# Patient Record
Sex: Male | Born: 1940 | Race: Black or African American | Hispanic: No | Marital: Married | Smoking: Never smoker
Health system: Southern US, Community
[De-identification: ages and names within clinical notes are randomized; demographics above are authoritative.]

## PROBLEM LIST (undated history)

## (undated) DIAGNOSIS — L819 Disorder of pigmentation, unspecified: Secondary | ICD-10-CM

## (undated) DIAGNOSIS — N184 Chronic kidney disease, stage 4 (severe): Secondary | ICD-10-CM

## (undated) DIAGNOSIS — I739 Peripheral vascular disease, unspecified: Secondary | ICD-10-CM

## (undated) DIAGNOSIS — I779 Disorder of arteries and arterioles, unspecified: Secondary | ICD-10-CM

## (undated) DIAGNOSIS — N183 Chronic kidney disease, stage 3 unspecified: Secondary | ICD-10-CM

## (undated) DIAGNOSIS — K219 Gastro-esophageal reflux disease without esophagitis: Secondary | ICD-10-CM

## (undated) DIAGNOSIS — E7800 Pure hypercholesterolemia, unspecified: Secondary | ICD-10-CM

## (undated) DIAGNOSIS — R200 Anesthesia of skin: Secondary | ICD-10-CM

## (undated) DIAGNOSIS — E1151 Type 2 diabetes mellitus with diabetic peripheral angiopathy without gangrene: Secondary | ICD-10-CM

## (undated) DIAGNOSIS — E669 Obesity, unspecified: Secondary | ICD-10-CM

## (undated) DIAGNOSIS — I129 Hypertensive chronic kidney disease with stage 1 through stage 4 chronic kidney disease, or unspecified chronic kidney disease: Secondary | ICD-10-CM

## (undated) DIAGNOSIS — E785 Hyperlipidemia, unspecified: Secondary | ICD-10-CM

## (undated) DIAGNOSIS — E119 Type 2 diabetes mellitus without complications: Secondary | ICD-10-CM

## (undated) HISTORY — PX: TONSILLECTOMY: SUR1361

## (undated) HISTORY — DX: Type 2 diabetes mellitus with diabetic peripheral angiopathy without gangrene: E11.51

## (undated) HISTORY — DX: Type 2 diabetes mellitus without complications: E11.9

## (undated) HISTORY — DX: Anesthesia of skin: R20.0

## (undated) HISTORY — DX: Chronic kidney disease, stage 3 unspecified: N18.30

## (undated) HISTORY — DX: Chronic kidney disease, stage 3 (moderate): N18.3

## (undated) HISTORY — DX: Hyperlipidemia, unspecified: E78.5

## (undated) HISTORY — DX: Obesity, unspecified: E66.9

## (undated) HISTORY — DX: Gastro-esophageal reflux disease without esophagitis: K21.9

## (undated) HISTORY — DX: Disorder of pigmentation, unspecified: L81.9

## (undated) HISTORY — DX: Disorder of arteries and arterioles, unspecified: I77.9

## (undated) HISTORY — DX: Hypertensive chronic kidney disease with stage 1 through stage 4 chronic kidney disease, or unspecified chronic kidney disease: I12.9

## (undated) HISTORY — DX: Pure hypercholesterolemia, unspecified: E78.00

## (undated) HISTORY — PX: WISDOM TOOTH EXTRACTION: SHX21

## (undated) HISTORY — DX: Peripheral vascular disease, unspecified: I73.9

---

## 2003-08-28 ENCOUNTER — Encounter: Admission: RE | Admit: 2003-08-28 | Discharge: 2003-08-28 | Payer: Self-pay | Admitting: Family Medicine

## 2012-07-04 ENCOUNTER — Other Ambulatory Visit: Payer: Self-pay | Admitting: Gastroenterology

## 2012-08-22 ENCOUNTER — Encounter (HOSPITAL_COMMUNITY): Payer: Self-pay | Admitting: Pharmacy Technician

## 2012-09-13 ENCOUNTER — Encounter (HOSPITAL_COMMUNITY): Admission: RE | Payer: Self-pay | Source: Ambulatory Visit

## 2012-09-13 SURGERY — COLONOSCOPY WITH PROPOFOL
Anesthesia: Monitor Anesthesia Care

## 2012-09-14 ENCOUNTER — Ambulatory Visit (HOSPITAL_COMMUNITY): Admission: RE | Admit: 2012-09-14 | Payer: 59 | Source: Ambulatory Visit | Admitting: Gastroenterology

## 2012-11-16 ENCOUNTER — Encounter: Payer: Self-pay | Admitting: Physician Assistant

## 2013-01-18 ENCOUNTER — Encounter: Payer: Self-pay | Admitting: Podiatry

## 2013-01-18 ENCOUNTER — Ambulatory Visit (INDEPENDENT_AMBULATORY_CARE_PROVIDER_SITE_OTHER): Payer: Medicare HMO | Admitting: Podiatry

## 2013-01-18 VITALS — BP 140/80 | HR 74 | Resp 20 | Ht 66.0 in | Wt 250.0 lb

## 2013-01-18 DIAGNOSIS — B351 Tinea unguium: Secondary | ICD-10-CM

## 2013-01-18 DIAGNOSIS — M79609 Pain in unspecified limb: Secondary | ICD-10-CM

## 2013-01-18 NOTE — Progress Notes (Signed)
Patient ID: Daniel Warner, male   DOB: 06-12-1940, 73 y.o.   MRN: [id number]  Subjective: This 73 year old black diabetic male presents for ongoing debridement of painful mycotic toenails an approximately three-month intervals. His last visit for this service was 10/05/2012. His been a patient in this practice since 2013.  Objective: Hypertrophic, incurvated, discolored toenails with palpable tenderness in all nail plates  Assessment: Symptomatic onychomycoses x10  Plan: Debridement of 10 toenails without a bleeding and reappoint at three-month intervals.

## 2013-02-03 ENCOUNTER — Ambulatory Visit: Payer: 59 | Admitting: Interventional Cardiology

## 2013-02-24 ENCOUNTER — Ambulatory Visit: Payer: Commercial Managed Care - HMO | Admitting: Interventional Cardiology

## 2013-04-11 ENCOUNTER — Ambulatory Visit (INDEPENDENT_AMBULATORY_CARE_PROVIDER_SITE_OTHER): Payer: Commercial Managed Care - HMO | Admitting: Interventional Cardiology

## 2013-04-11 ENCOUNTER — Encounter: Payer: Self-pay | Admitting: Interventional Cardiology

## 2013-04-11 VITALS — BP 140/72 | HR 79 | Ht 66.0 in | Wt 260.0 lb

## 2013-04-11 DIAGNOSIS — E669 Obesity, unspecified: Secondary | ICD-10-CM | POA: Insufficient documentation

## 2013-04-11 DIAGNOSIS — I10 Essential (primary) hypertension: Secondary | ICD-10-CM | POA: Insufficient documentation

## 2013-04-11 DIAGNOSIS — E782 Mixed hyperlipidemia: Secondary | ICD-10-CM | POA: Insufficient documentation

## 2013-04-11 DIAGNOSIS — R9431 Abnormal electrocardiogram [ECG] [EKG]: Secondary | ICD-10-CM

## 2013-04-11 DIAGNOSIS — I6529 Occlusion and stenosis of unspecified carotid artery: Secondary | ICD-10-CM | POA: Insufficient documentation

## 2013-04-11 NOTE — Progress Notes (Signed)
Patient ID: Daniel Warner, male   DOB: [birth year], 73 y.o.   MRN: [id number]    Daniel Warner, Daniel Warner, Daniel Warner Phone: [phone] Fax:  [phone]  Date:  04/11/2013   ID:  Daniel Warner, DOB 12-29-1940, MRN [id number]  PCP:  Daniel Argyle, Daniel Warner      History of Present Illness: Daniel Warner is a 73 y.o. male who has noticed some discoloration of the side of the right leg, for about a year. He thinks it was related to carrying a bag that constantly rubbed against his leg. No prolonged pain in that leg. No calf pain. No pain associated with walking. He felt that his legs get tired sometimes last year, but this has resolved. He is not having to stop walking due to leg pain. No ulcers on his feet. Decreased numbness in his toes in the mornings, sometimes it is absent. He did a treadmill test 2 years ago at the [place] and he did well on that.  No CP.  Mild DOE with walking.   Wt Readings from Last 3 Encounters:  04/11/13 260 lb (117.935 kg)  01/18/13 250 lb (113.399 kg)     Past Medical History  Diagnosis Date  . Diabetes mellitus without complication   . GERD (gastroesophageal reflux disease)   . Hyperlipidemia   . Hypercholesterolemia   . Peripheral arterial disease   . Diabetes mellitus type 2 with peripheral artery disease   . Benign hypertension with chronic kidney disease, stage III   . CKD (chronic kidney disease), stage III   . Numbness of toes     In the morning  . Carotid artery disease   . Obesity   . Discolored skin     Right leg, thinks it's from a bag that has rubbed against his leg for so long. Doppler 12/2010    Current Outpatient Prescriptions  Medication Sig Dispense Refill  . amLODipine (NORVASC) 10 MG tablet Take 10 mg by mouth every morning.      Marland Kitchen atorvastatin (LIPITOR) 10 MG tablet Take 10 mg by mouth every morning.      Marland Kitchen glimepiride (AMARYL) 4 MG tablet Take 4 mg by mouth daily with breakfast.      . metFORMIN  (GLUCOPHAGE) 850 MG tablet Take 850 mg by mouth every 8 (eight) hours.       Marland Kitchen omeprazole (PRILOSEC) 20 MG capsule Take 20 mg by mouth daily.      . valsartan (DIOVAN) 80 MG tablet Take 80 mg by mouth daily.       No current facility-administered medications for this visit.    Allergies:   No Known Allergies  Social History:  The patient  reports that he has never smoked. He has never used smokeless tobacco. He reports that he does not drink alcohol or use illicit drugs.   Family History:  The patient's family history includes Heart disease in his cousin and paternal aunt; Irritable bowel syndrome in his mother; Stroke in his father.   ROS:  Please see the history of present illness.  No nausea, vomiting.  No fevers, chills.  No focal weakness.  No dysuria.    All other systems reviewed and negative. Mild leg swelling.  PHYSICAL EXAM: VS:  BP 140/72  Pulse 79  Ht $R'5\' 6"'EA$  (1.676 m)  Wt 260 lb (117.935 kg)  BMI 41.99 kg/m2 Well nourished, well developed, in no acute distress HEENT: normal Neck: no JVD, no  carotid bruits Cardiac:  normal S1, S2; RRR;  Lungs:  clear to auscultation bilaterally, no wheezing, rhonchi or rales Abd: soft, nontender, no hepatomegaly Ext: no edema Skin: warm and dry Neuro:   no focal abnormalities noted  EKG:  NSR, inferior Q waves    ASSESSMENT AND PLAN:  Peripheral artery disease  Continue Atorvastatin Calcium Tablet, 10 MG, 1 tablet, Orally, Once a day, 30 day(s), 30, Refills 11 Does not appear to have limb threatening ischemia. No ulcers. No rest pain. Watch for claudication. Continue regular walking to try to improve circulation. LDL 94. Increase exercise to increase HDL (27).    2. Hypertension  Stopped Chlorthalidone Tablet, 25 MG, 1 tablet every morning, Orally, Once a day Continue Amlodipine Besylate Tablet, 10 MG, 1 tablet, Orally, Once a day Continue Valsartan Tablet, 80 MG, 1 tablet, Orally, Once a day Check BP occasionally at home or at  drugstore.   3. Obesity  Needs to work on weight loss. He had lost about 13 lbs but gained some back in the past few months.  Did modify diet to get HbA1c down from 8 to 6.1.   4. Carotid Artery Disease  Moderate by Doppler in 2013.     Abnormal ECG: Q waves in leads 3 and AVF.  Change in lead 3; AVF similar to prior. No cardiac sx.  WOuld hold off on evaluation at this time.     5.  Hyperlipidemia:  LDL 101, HDL 26, TG 85  In 4/14: COntinue atorvastatin. Preventive Medicine  Adult topics discussed:  Diet: healthy diet, low calorie, low fat.  Exercise: at least 30 minutes of aerobic exercise, 5 days a week.      Signed, Mina Marble, Daniel Warner, [facility] 04/11/2013 3:26 PM

## 2013-04-11 NOTE — Patient Instructions (Signed)
Your physician recommends that you continue on your current medications as directed. Please refer to the Current Medication list given to you today.  Your physician wants you to follow-up in: 1 year with Dr. Varanasi. You will receive a reminder letter in the mail two months in advance. If you don't receive a letter, please call our office to schedule the follow-up appointment.  

## 2013-04-12 ENCOUNTER — Encounter: Payer: Self-pay | Admitting: Podiatry

## 2013-04-12 ENCOUNTER — Ambulatory Visit (INDEPENDENT_AMBULATORY_CARE_PROVIDER_SITE_OTHER): Payer: Medicare HMO | Admitting: Podiatry

## 2013-04-12 VITALS — BP 201/100 | HR 98 | Resp 18 | Ht 64.0 in | Wt 260.0 lb

## 2013-04-12 DIAGNOSIS — M79609 Pain in unspecified limb: Secondary | ICD-10-CM

## 2013-04-12 DIAGNOSIS — B351 Tinea unguium: Secondary | ICD-10-CM

## 2013-04-12 NOTE — Progress Notes (Signed)
Patient ID: Daniel Warner, male   DOB: 02-10-1940, 73 y.o.   MRN: [id number] Subjective: This 73 year old black diabetic male presents for ongoing debridement of painful mycotic toenails an approximately three-month intervals. His last visit for this service was 01/18/2013. His been a patient in this practice since 2013.   Objective: Hypertrophic, incurvated, discolored toenails with palpable tenderness in all nail plates   Assessment: Symptomatic onychomycoses x10   Plan: Debridement of 10 toenails without a bleeding and reappoint at three-month intervals.

## 2013-04-12 NOTE — Patient Instructions (Signed)
Use all-purpose non-fragrance skin lotion on your feet daily

## 2013-04-15 ENCOUNTER — Encounter: Payer: Self-pay | Admitting: *Deleted

## 2013-06-23 ENCOUNTER — Other Ambulatory Visit: Payer: Self-pay | Admitting: Interventional Cardiology

## 2013-11-15 ENCOUNTER — Encounter: Payer: Self-pay | Admitting: Podiatry

## 2013-11-15 ENCOUNTER — Ambulatory Visit (INDEPENDENT_AMBULATORY_CARE_PROVIDER_SITE_OTHER): Payer: Commercial Managed Care - HMO | Admitting: Podiatry

## 2013-11-15 VITALS — BP 135/69 | HR 82 | Resp 12

## 2013-11-15 DIAGNOSIS — B351 Tinea unguium: Secondary | ICD-10-CM

## 2013-11-15 DIAGNOSIS — M79676 Pain in unspecified toe(s): Secondary | ICD-10-CM

## 2013-11-15 NOTE — Progress Notes (Signed)
Patient ID: Daniel Warner, male   DOB: 22-Jun-1940, 73 y.o.   MRN: [id number]  Subjective: This diabetic male presents for ongoing debridement of painful mycotic toenails  Objective: Elongated, hypertrophic, incurvated, discolored toenails 6-10  Assessment: Symptomatic onychomycoses 6-10  Plan: Debrided toenails 10 without a bleeding  Reappoint 3 months

## 2014-02-21 ENCOUNTER — Ambulatory Visit: Payer: Commercial Managed Care - HMO | Admitting: Podiatry

## 2014-10-15 ENCOUNTER — Encounter (HOSPITAL_COMMUNITY): Payer: Self-pay | Admitting: *Deleted

## 2014-10-15 ENCOUNTER — Emergency Department (HOSPITAL_COMMUNITY)
Admission: EM | Admit: 2014-10-15 | Discharge: 2014-10-15 | Disposition: A | Discharge status: Home / Self Care | Payer: Commercial Managed Care - HMO | Attending: Emergency Medicine | Admitting: Emergency Medicine

## 2014-10-15 DIAGNOSIS — Z872 Personal history of diseases of the skin and subcutaneous tissue: Secondary | ICD-10-CM | POA: Insufficient documentation

## 2014-10-15 DIAGNOSIS — E669 Obesity, unspecified: Secondary | ICD-10-CM | POA: Insufficient documentation

## 2014-10-15 DIAGNOSIS — N189 Chronic kidney disease, unspecified: Secondary | ICD-10-CM | POA: Diagnosis not present

## 2014-10-15 DIAGNOSIS — Z79899 Other long term (current) drug therapy: Secondary | ICD-10-CM | POA: Diagnosis not present

## 2014-10-15 DIAGNOSIS — M79674 Pain in right toe(s): Secondary | ICD-10-CM | POA: Diagnosis not present

## 2014-10-15 DIAGNOSIS — K219 Gastro-esophageal reflux disease without esophagitis: Secondary | ICD-10-CM | POA: Diagnosis not present

## 2014-10-15 DIAGNOSIS — E1151 Type 2 diabetes mellitus with diabetic peripheral angiopathy without gangrene: Secondary | ICD-10-CM | POA: Insufficient documentation

## 2014-10-15 DIAGNOSIS — M79671 Pain in right foot: Secondary | ICD-10-CM | POA: Diagnosis present

## 2014-10-15 DIAGNOSIS — I739 Peripheral vascular disease, unspecified: Secondary | ICD-10-CM | POA: Insufficient documentation

## 2014-10-15 DIAGNOSIS — I70221 Atherosclerosis of native arteries of extremities with rest pain, right leg: Secondary | ICD-10-CM | POA: Diagnosis not present

## 2014-10-15 DIAGNOSIS — I129 Hypertensive chronic kidney disease with stage 1 through stage 4 chronic kidney disease, or unspecified chronic kidney disease: Secondary | ICD-10-CM | POA: Diagnosis not present

## 2014-10-15 LAB — CBC WITH DIFFERENTIAL/PLATELET
Basophils Absolute: 0 10*3/uL (ref 0.0–0.1)
Basophils Relative: 0 %
Eosinophils Absolute: 0.2 10*3/uL (ref 0.0–0.7)
Eosinophils Relative: 2 %
HEMATOCRIT: 38.6 % — AB (ref 39.0–52.0)
HEMOGLOBIN: 13 g/dL (ref 13.0–17.0)
LYMPHS ABS: 2.2 10*3/uL (ref 0.7–4.0)
Lymphocytes Relative: 26 %
MCH: 26.6 pg (ref 26.0–34.0)
MCHC: 33.7 g/dL (ref 30.0–36.0)
MCV: 79.1 fL (ref 78.0–100.0)
MONO ABS: 0.5 10*3/uL (ref 0.1–1.0)
MONOS PCT: 6 %
NEUTROS ABS: 5.4 10*3/uL (ref 1.7–7.7)
NEUTROS PCT: 66 %
Platelets: 218 10*3/uL (ref 150–400)
RBC: 4.88 MIL/uL (ref 4.22–5.81)
RDW: 16.2 % — AB (ref 11.5–15.5)
WBC: 8.2 10*3/uL (ref 4.0–10.5)

## 2014-10-15 LAB — BASIC METABOLIC PANEL WITH GFR
Anion gap: 12 (ref 5–15)
BUN: 15 mg/dL (ref 6–20)
CO2: 21 mmol/L — ABNORMAL LOW (ref 22–32)
Calcium: 9 mg/dL (ref 8.9–10.3)
Chloride: 100 mmol/L — ABNORMAL LOW (ref 101–111)
Creatinine, Ser: 1.46 mg/dL — ABNORMAL HIGH (ref 0.61–1.24)
GFR calc Af Amer: 53 mL/min — ABNORMAL LOW
GFR calc non Af Amer: 46 mL/min — ABNORMAL LOW
Glucose, Bld: 131 mg/dL — ABNORMAL HIGH (ref 65–99)
Potassium: 4.7 mmol/L (ref 3.5–5.1)
Sodium: 133 mmol/L — ABNORMAL LOW (ref 135–145)

## 2014-10-15 LAB — PROTIME-INR
INR: 1.11 (ref 0.00–1.49)
Prothrombin Time: 14.5 s (ref 11.6–15.2)

## 2014-10-15 MED ORDER — OXYCODONE-ACETAMINOPHEN 5-325 MG PO TABS: 1.0000 | ORAL_TABLET | Freq: Four times a day (QID) | ORAL | Status: DC | PRN

## 2014-10-15 MED ORDER — OXYCODONE-ACETAMINOPHEN 5-325 MG PO TABS
2.0000 | ORAL_TABLET | Freq: Once | ORAL | Status: AC
Start: 2014-10-15 — End: 2014-10-15
  Administered 2014-10-15: 2 via ORAL
  Filled 2014-10-15: qty 2

## 2014-10-15 MED ORDER — MORPHINE SULFATE (PF) 4 MG/ML IV SOLN
4.0000 mg | Freq: Once | INTRAVENOUS | Status: AC
  Administered 2014-10-15: 4 mg via INTRAVENOUS
  Filled 2014-10-15: qty 1

## 2014-10-15 MED ORDER — SODIUM CHLORIDE 0.9 % IV BOLUS (SEPSIS)
500.0000 mL | Freq: Once | INTRAVENOUS | Status: AC
  Administered 2014-10-15: 500 mL via INTRAVENOUS

## 2014-10-15 NOTE — Discharge Instructions (Signed)
Peripheral Vascular Disease  Peripheral Vascular Disease (PVD), also called Peripheral Arterial Disease (PAD), is a circulation problem caused by cholesterol (atherosclerotic plaque) deposits in the arteries. PVD commonly occurs in the lower extremities (legs) but it can occur in other areas of the body, such as your arms. The cholesterol buildup in the arteries reduces blood flow which can cause pain and other serious problems. The presence of PVD can place a person at risk for Coronary Artery Disease (CAD).  CAUSES  Causes of PVD can be many. It is usually associated with more than one risk factor such as:   High Cholesterol.  Smoking.  Diabetes.  Lack of exercise or inactivity.  High blood pressure (hypertension).  Obesity.  Family history. SYMPTOMS   When the lower extremities are affected, patients with PVD may experience:  Leg pain with exertion or physical activity. This is called INTERMITTENT CLAUDICATION. This may present as cramping or numbness with physical activity. The location of the pain is associated with the level of blockage. For example, blockage at the abdominal level (distal abdominal aorta) may result in buttock or hip pain. Lower leg arterial blockage may result in calf pain.  As PVD becomes more severe, pain can develop with less physical activity.  In people with severe PVD, leg pain may occur at rest.  Other PVD signs and symptoms:  Leg numbness or weakness.  Coldness in the affected leg or foot, especially when compared to the other leg.  A change in leg color.  Patients with significant PVD are more prone to ulcers or sores on toes, feet or legs. These may take longer to heal or may reoccur. The ulcers or sores can become infected.  If signs and symptoms of PVD are ignored, gangrene may occur. This can result in the loss of toes or loss of an entire limb.  Not all leg pain is related to PVD. Other medical conditions can cause leg pain such  as:  Blood clots (embolism) or Deep Vein Thrombosis.  Inflammation of the blood vessels (vasculitis).  Spinal stenosis. DIAGNOSIS  Diagnosis of PVD can involve several different types of tests. These can include:  Pulse Volume Recording Method (PVR). This test is simple, painless and does not involve the use of X-rays. PVR involves measuring and comparing the blood pressure in the arms and legs. An ABI (Ankle-Brachial Index) is calculated. The normal ratio of blood pressures is 1. As this number becomes smaller, it indicates more severe disease.  < 0.95 - indicates significant narrowing in one or more leg vessels.  <0.8 - there will usually be pain in the foot, leg or buttock with exercise.  <0.4 - will usually have pain in the legs at rest.  <0.25 - usually indicates limb threatening PVD.  Doppler detection of pulses in the legs. This test is painless and checks to see if you have a pulses in your legs/feet.  A dye or contrast material (a substance that highlights the blood vessels so they show up on x-ray) may be given to help your caregiver better see the arteries for the following tests. The dye is eliminated from your body by the kidney's. Your caregiver may order blood work to check your kidney function and other laboratory values before the following tests are performed:  Magnetic Resonance Angiography (MRA). An MRA is a picture study of the blood vessels and arteries. The MRA machine uses a large magnet to produce images of the blood vessels.  Computed Tomography Angiography (CTA). A  CTA is a specialized x-ray that looks at how the blood flows in your blood vessels. An IV may be inserted into your arm so contrast dye can be injected.  Angiogram. Is a procedure that uses x-rays to look at your blood vessels. This procedure is minimally invasive, meaning a small incision (cut) is made in your groin. A small tube (catheter) is then inserted into the artery of your groin. The catheter  is guided to the blood vessel or artery your caregiver wants to examine. Contrast dye is injected into the catheter. X-rays are then taken of the blood vessel or artery. After the images are obtained, the catheter is taken out. TREATMENT  Treatment of PVD involves many interventions which may include:  Lifestyle changes:  Quitting smoking.  Exercise.  Following a low fat, low cholesterol diet.  Control of diabetes.  Foot care is very important to the PVD patient. Good foot care can help prevent infection.  Medication:  Cholesterol-lowering medicine.  Blood pressure medicine.  Anti-platelet drugs.  Certain medicines may reduce symptoms of Intermittent Claudication.  Interventional/Surgical options:  Angioplasty. An Angioplasty is a procedure that inflates a balloon in the blocked artery. This opens the blocked artery to improve blood flow.  Stent Implant. A wire mesh tube (stent) is placed in the artery. The stent expands and stays in place, allowing the artery to remain open.  Peripheral Bypass Surgery. This is a surgical procedure that reroutes the blood around a blocked artery to help improve blood flow. This type of procedure may be performed if Angioplasty or stent implants are not an option. SEEK IMMEDIATE MEDICAL CARE IF:   You develop pain or numbness in your arms or legs.  Your arm or leg turns cold, becomes blue in color.  You develop redness, warmth, swelling and pain in your arms or legs. MAKE SURE YOU:   Understand these instructions.  Will watch your condition.  Will get help right away if you are not doing well or get worse. Document Released: 02/06/2004 Document Revised: 03/23/2011 Document Reviewed: 01/03/2008 [facility] Patient Information 2015 [place], [place]. This information is not intended to replace advice given to you by your health care provider. Make sure you discuss any questions you have with your health care provider.  Intermittent  Claudication Blockage of leg arteries results from poor circulation of blood in the leg arteries. This produces an aching, tired, and sometimes burning pain in the legs that is brought on by exercise and made better by rest. Claudication refers to the limping that happens from leg cramps. It is also referred to as Vaso-occlusive disease of the legs, arterial insufficiency of the legs, recurrent leg pain, recurrent leg cramping and calf pain with exercise.  CAUSES  This condition is due to narrowing or blockage of the arteries (muscular vessels which carry blood away from the heart and around the body). Blockage of arteries can occur anywhere in the body. If they occur in the heart, a person may experience angina (chest pain) or even a heart attack. If they occur in the neck or the brain, a person may have a stroke. Intermittent claudication is when the blockage occurs in the legs, most commonly in the calf or the foot.  Atherosclerosis, or blockage of arteries, can occur for many reasons. Some of these are smoking, diabetes, and high cholesterol. SYMPTOMS  Intermittent claudication may occur in both legs, and it often continues to get worse over time. However, some people complain only of weakness in the  legs when walking, or a feeling of "tiredness" in the buttocks. Impotence (not able to have an erection) is an occasional complaint in men. Pain while resting is uncommon.  WHAT TO EXPECT AT [facility] PROVIDER'S OFFICE: Your medical history will be asked for and a physical examination will be performed. Medical history questions documenting claudication in detail may include:   Time pattern  Do you have leg cramps at night (nocturnal cramps)?  How often does leg pain with cramping occur?  Is it getting worse?  What is the quality of the pain?  Is the pain sharp?  Is there an aching pain with the cramps?  Aggravating factors  Is it worse after you exercise?  Is it worse after you  are standing for a while?  Do you smoke? How much?  Do you drink alcohol? How much?  Are you diabetic? How well is your blood sugar controlled?  Other  What other symptoms are also present?  Has there been impotence (men)?  Is there pain in the back?  Is there a darkening of the skin of the legs, feet or toes?  Is there weakness or paralysis of the legs? The physical examination may include evaluation of the femoral pulse (in the groin) and the other areas where the pulse can be felt in the legs. DIAGNOSIS  Diagnostic tests that may be performed include:  Blood pressure measured in arms and legs for comparison.  Doppler ultrasonography on the legs and the heart.  Duplex Doppler/ultrasound exam of extremity to visualize arterial blood flow.  ECG- to evaluate the activity of your heart.  Aortography- to visualize blockages in your arteries. TREATMENT Surgical treatment may be suggested if claudication interferes with the patient's activities or work, and if the diseased arteries do not seem to be improving after treatment. Be aware that this condition can worsen over time and you should carefully monitor your condition. HOME CARE INSTRUCTIONS  Talk to your caregiver about the cause of your leg cramping and about what to do at home to relieve it.  A healthy diet is important to lessen the likeliness of atherosclerosis.  A program of daily walking for short periods, and stopping for pain or cramping, may help improve function.  It is important to stop smoking.  Avoid putting hot or cold items on legs.  Avoid tight shoes. SEEK MEDICAL CARE IF: There are many other causes of leg pain such as arthritis or low blood potassium. However, some causes of leg pain may be life threatening such as a blood clot in the legs. Seek medical attention if you have:  Leg pain that does not go away.  Legs that may be red, hot or swollen.  Ulcers or sores appear on your ankle or  foot.  Any chest pain or shortness of breath accompanying leg pain.  Diabetes.  You are pregnant. SEEK IMMEDIATE MEDICAL CARE IF:   Your leg pain becomes severe or will not go away.  Your foot turns blue or a dark color.  Your leg becomes red, hot or swollen or you develop a fever over 102F.  Any chest pain or shortness of breath accompanying leg pain. MAKE SURE YOU:   Understand these instructions.  Will watch your condition.  Will get help right away if you are not doing well or get worse. Document Released: 11/01/2003 Document Revised: 03/23/2011 Document Reviewed: 04/06/2013 [facility] Patient Information 2015 [place], [place]. This information is not intended to replace advice given to you by  your health care provider. Make sure you discuss any questions you have with your health care provider.     Angiogram An angiogram, also called angiography, is a procedure used to look at the blood vessels that carry blood to different parts of your body (arteries). In this procedure, dye is injected through a long, thin tube (catheter) into an artery. X-rays are then taken. The X-rays will show if there is a blockage or problem in a blood vessel.  LET [facility] CARE PROVIDER KNOW ABOUT:  Any allergies you have, including allergies to shellfish or contrast dye.   All medicines you are taking, including vitamins, herbs, eye drops, creams, and over-the-counter medicines.   Previous problems you or members of your family have had with the use of anesthetics.   Any blood disorders you have.   Previous surgeries you have had.  Any previous kidney problems or failure you have had.  Medical conditions you have.   Possibility of pregnancy, if this applies. RISKS AND COMPLICATIONS Generally, an angiogram is a safe procedure. However, as with any procedure, problems can occur. Possible problems include:  Injury to the blood vessels, including rupture or bleeding.  Infection  or bruising at the catheter site.  Allergic reaction to the dye or contrast used.  Kidney damage from the dye or contrast used.  Blood clots that can lead to a stroke or heart attack. BEFORE THE PROCEDURE  Do not eat or drink after midnight on the night before the procedure, or as directed by your health care provider.   Ask your health care provider if you may drink enough water to take any needed medicines the morning of the procedure.  PROCEDURE  You may be given a medicine to help you relax (sedative) before and during the procedure. This medicine is given through an IV access tube that is inserted into one of your veins.   The area where the catheter will be inserted will be washed and shaved. This is usually done in the groin but may be done in the fold of your arm (near your elbow) or in the wrist.  A medicine will be given to numb the area where the catheter will be inserted (local anesthetic).  The catheter will be inserted with a guide wire into an artery. The catheter is guided by using a type of X-ray (fluoroscopy) to the blood vessel being examined.   Dye is then injected into the catheter, and X-rays are taken. The dye helps to show where any narrowing or blockages are located.  AFTER THE PROCEDURE   If the procedure is done through the leg, you will be kept in bed lying flat for several hours. You will be instructed to not bend or cross your legs.  The insertion site will be checked frequently.  The pulse in your feet or wrist will be checked frequently.  Additional blood tests, X-rays, and electrocardiography may be done.   You may need to stay in the hospital overnight for observation.  Document Released: 10/08/2004 Document Revised: 01/03/2013 Document Reviewed: 06/01/2012 [facility] Patient Information 2015 [place], [place]. This information is not intended to replace advice given to you by your health care provider. Make sure you discuss any questions you  have with your health care provider.

## 2014-10-15 NOTE — ED Notes (Signed)
Pt stable, ambulatory, states understanding of discharge instructions 

## 2014-10-15 NOTE — ED Notes (Addendum)
Pt pain to right toes since wed, [facility] and given pain meds with no relief. Reports pain more severe in little toe but occurs in all digits. Went back to pcp today and sent here due to discoloration of toes and severe pain. Hx of PVD.

## 2014-10-15 NOTE — Consult Note (Signed)
Patient name: Daniel Warner MRN: [id number] DOB: 1940/03/02 Sex: male   Referred by: EDP  Reason for referral:  Chief Complaint  Patient presents with  . Foot Pain    HISTORY OF PRESENT ILLNESS: The patient is a 74 year old gentleman who presents with progressive pain in his right foot. He reports that the beginning approximate 4 days ago he started having worsening pain in his right foot and this is become to the point where he is now having rest pain. Was seen by his primary care physician was given Percocet 05/15/2023 but is only taking one every 8 hours. Reports that he is having relief when he hangs his foot dependently but when he puts his foot level with his body he has pain. He does have motor and sensory function intact in his foot. He has no history of any tissue loss. Reports discomfort in both legs with prolonged walking. He is morbidly obese and does not do a great deal of walking. Long history of hypertension and non-insulin-dependent diabetes.  Past Medical History  Diagnosis Date  . Diabetes mellitus without complication ([place])   . GERD (gastroesophageal reflux disease)   . Hyperlipidemia   . Hypercholesterolemia   . Peripheral arterial disease ([place])   . Diabetes mellitus type 2 with peripheral artery disease ([place])   . Benign hypertension with chronic kidney disease, stage III   . CKD (chronic kidney disease), stage III   . Numbness of toes     In the morning  . Carotid artery disease ([place])   . Obesity   . Discolored skin     Right leg, thinks it's from a bag that has rubbed against his leg for so long. Doppler 12/2010    Past Surgical History  Procedure Laterality Date  . Tonsillectomy    . Wisdom tooth extraction      Social History   Social History  . Marital Status: Married    Spouse Name: N/A  . Number of Children: N/A  . Years of Education: N/A   Occupational History  . Not on file.   Social History Main Topics  . Smoking status:  Never Smoker   . Smokeless tobacco: Never Used  . Alcohol Use: No  . Drug Use: No  . Sexual Activity: Not on file   Other Topics Concern  . Not on file   Social History Narrative    Family History  Problem Relation Age of Onset  . Stroke Father   . Irritable bowel syndrome Mother   . Heart disease Paternal Aunt     CABG  . Heart disease Cousin     CABG    Allergies as of 10/15/2014  . (No Known Allergies)    No current facility-administered medications on file prior to encounter.   Current Outpatient Prescriptions on File Prior to Encounter  Medication Sig Dispense Refill  . metFORMIN (GLUCOPHAGE) 850 MG tablet Take 850 mg by mouth every 8 (eight) hours.     Marland Kitchen omeprazole (PRILOSEC) 20 MG capsule Take 20 mg by mouth daily.    Marland Kitchen amLODipine (NORVASC) 10 MG tablet Take 10 mg by mouth every morning.       REVIEW OF SYSTEMS:  Reviewed in his history and physical with nothing to add  PHYSICAL EXAMINATION:  General: The patient is a well-nourished male, in no acute distress. Vital signs are BP 182/74 mmHg  Pulse 73  Temp(Src) 98.5 F (36.9 C) (Oral)  Resp 16  Ht  5' 6.5" (1.689 m)  Wt 257 lb (116.574 kg)  BMI 40.86 kg/m2  SpO2 97% Pulmonary: There is a good air exchange  Abdomen: Soft and non-tender . No masses noted Musculoskeletal: There are no major deformities.   Neurologic: No focal weakness or paresthesias are detected, Skin: There are no ulcer or rashes noted. Psychiatric: The patient has normal affect. Cardiovascular: 2+ radial and 2+ femoral pulses bilaterally. Has a 2+ left popliteal and left dorsalis pedis pulse. I do not palpate right popliteal or distal pulses Pitting edema bilaterally Cyanotic changes of the tips of the toes on his right foot which is not present on his left foot.  Laboratory data is pending. Most recent creatinine in the system was 1.4  Impression and Plan:  Rest pain right foot. This is been progressive. No evidence of critical  acute ischemia. Will obtain labs this evening. Will be discharged home this evening and will be admitted for outpatient arteriography tomorrow. Does appear to have superficial femoral occlusive disease and possible tibial disease as well. Explained that he will require a revascularization depending on his studies. Be nothing by mouth after midnight except for medications. Understands that Dr. Trula Slade will be doing the arteriogram tomorrow.    Curt Jews [facility] Office: [phone]

## 2014-10-15 NOTE — ED Provider Notes (Signed)
CSN: [id number]     Arrival date & time 10/15/14  1159 History   First MD Initiated Contact with Patient 10/15/14 1440     Chief Complaint  Patient presents with  . Foot Pain     (Consider location/radiation/quality/duration/timing/severity/associated sxs/prior Treatment) HPI   Patient is a 74 year old male with history of diabetes, hyperlipidemia, PAD, CKD stage III, who reports to the emergency room after being evaluated by his primary care physician for progressive toe pain over the last week. Dr. Enedina Finner physicians evaluated the patient this morning and was concern for ischemic right foot.  Patient states that over the past week he has had progressive toe pain beginning in his right pinky toe, and spreading to the rest toes, which has been associated with a blue color, pain worse when legs are elevated or when he is laying flat, he has been unable to sleep at night. The pain is improved with dangling them off the bed or with walking. He states that at night he cannot sleep and he'll walk around just manage the pain. Several days ago in order to try alleviate the pain he states he put his feet into hot water. He believes it may have burned feet because the water was too hot even though he could not feel it with his toes and feet.  He otherwise denies any peripheral neuropathy history with his diabetes.  He denies any numbness and tingling. He denies any increased pain in his lower extremities with ambulation. He does have lower extremity swelling, which is worse than normal. He denies having any ulcers or having any wound healing problems in the past. He denies any chest pain or shortness of breath currently. He was given Norco for his toe pain without any relief.   He states that although he has multiple medical conditions he is currently only taking Prilosec and metformin, and his insurance would not pay for his other medications. He is not taking his statin, his blood pressure medication, or  his other diabetes medications. He denies taking aspirin.  Past Medical History  Diagnosis Date  . Diabetes mellitus without complication ([place])   . GERD (gastroesophageal reflux disease)   . Hyperlipidemia   . Hypercholesterolemia   . Peripheral arterial disease ([place])   . Diabetes mellitus type 2 with peripheral artery disease ([place])   . Benign hypertension with chronic kidney disease, stage III   . CKD (chronic kidney disease), stage III   . Numbness of toes     In the morning  . Carotid artery disease ([place])   . Obesity   . Discolored skin     Right leg, thinks it's from a bag that has rubbed against his leg for so long. Doppler 12/2010   Past Surgical History  Procedure Laterality Date  . Tonsillectomy    . Wisdom tooth extraction     Family History  Problem Relation Age of Onset  . Stroke Father   . Irritable bowel syndrome Mother   . Heart disease Paternal Aunt     CABG  . Heart disease Cousin     CABG   Social History  Substance Use Topics  . Smoking status: Never Smoker   . Smokeless tobacco: Never Used  . Alcohol Use: No    Review of Systems  Constitutional: Negative for fever, chills, diaphoresis and fatigue.  HENT: Negative.   Eyes: Negative.   Respiratory: Negative.  Negative for shortness of breath.   Cardiovascular: Positive for leg swelling. Negative  for chest pain and palpitations.  Gastrointestinal: Negative.   Genitourinary: Negative.   Musculoskeletal: Negative for arthralgias and gait problem.  Skin: Positive for color change and pallor. Negative for rash and wound.  Neurological: Positive for numbness. Negative for tremors, syncope and weakness.    Allergies  Review of patient's allergies indicates no known allergies.  Home Medications   Prior to Admission medications   Medication Sig Start Date End Date Taking? Authorizing Provider  HYDROcodone-acetaminophen (NORCO/VICODIN) 5-325 MG tablet Take 1 tablet by mouth every 6 (six) hours as  needed for moderate pain.   Yes Historical Provider, MD  metFORMIN (GLUCOPHAGE) 850 MG tablet Take 850 mg by mouth every 8 (eight) hours.    Yes Historical Provider, MD  omeprazole (PRILOSEC) 20 MG capsule Take 20 mg by mouth daily.   Yes Historical Provider, MD  amLODipine (NORVASC) 10 MG tablet Take 10 mg by mouth every morning.    Historical Provider, MD  oxyCODONE-acetaminophen (PERCOCET) 5-325 MG tablet Take 1 tablet by mouth every 6 (six) hours as needed for severe pain. 10/15/14   Delsa Grana, PA-C   BP 165/87 mmHg  Pulse 81  Temp(Src) 98.5 F (36.9 C) (Oral)  Resp 16  Ht 5' 6.5" (1.689 m)  Wt 257 lb (116.574 kg)  BMI 40.86 kg/m2  SpO2 100% Physical Exam  Constitutional: He is oriented to person, place, and time. He appears well-developed and well-nourished. No distress.  Patient appears uncomfortable.  HENT:  Head: Normocephalic and atraumatic.  Nose: Nose normal.  Mouth/Throat: Oropharynx is clear and moist. No oropharyngeal exudate.  Eyes: Conjunctivae and EOM are normal. Pupils are equal, round, and reactive to light. Right eye exhibits no discharge. Left eye exhibits no discharge. No scleral icterus.  Neck: Normal range of motion. No JVD present. No tracheal deviation present. No thyromegaly present.  Cardiovascular: Normal rate, regular rhythm, normal heart sounds and intact distal pulses.  Exam reveals no gallop and no friction rub.   No murmur heard. Decreased capillary refill in all toes in the right lower extremity, palpable right lower extremity dorsal pedis and posterior tibialis pulses, dorsum of foot is warm to the touch, speaking edema of the foot, 1+ pretibial pitting edema. Left lower extremity normal capillary refill of all toes, all toes pink, 2+ pitting edema dorsum of the foot  Pulmonary/Chest: Effort normal and breath sounds normal. No respiratory distress. He has no wheezes. He has no rales. He exhibits no tenderness.  Abdominal: Soft. Bowel sounds are  normal. He exhibits no distension and no mass. There is no tenderness. There is no rebound and no guarding.  Musculoskeletal: Normal range of motion. He exhibits no edema or tenderness.  Lymphadenopathy:    He has no cervical adenopathy.  Neurological: He is alert and oriented to person, place, and time. He has normal reflexes. No cranial nerve deficit. He exhibits normal muscle tone. Coordination normal.  Skin: Skin is warm and dry. No rash noted. He is not diaphoretic. No erythema. There is pallor.  No ulceration, no drainage Blue-purple distal toes throughout right lower extremity Bilateral lower extremity hyperpigmentation Hypertrophic nailbeds of all toes  Psychiatric: He has a normal mood and affect. His behavior is normal. Judgment and thought content normal.  Nursing note and vitals reviewed.       ED Course  Procedures (including critical care time) Labs Review Labs Reviewed  BASIC METABOLIC PANEL - Abnormal; Notable for the following:    Sodium 133 (*)    Chloride 100 (*)  CO2 21 (*)    Glucose, Bld 131 (*)    Creatinine, Ser 1.46 (*)    GFR calc non Af Amer 46 (*)    GFR calc Af Amer 53 (*)    All other components within normal limits  CBC WITH DIFFERENTIAL/PLATELET - Abnormal; Notable for the following:    HCT 38.6 (*)    RDW 16.2 (*)    All other components within normal limits  HEMOGLOBIN A1C - Abnormal; Notable for the following:    Hgb A1c MFr Bld 9.4 (*)    All other components within normal limits  PROTIME-INR    Imaging Review No results found. I have personally reviewed and evaluated these images and lab results as part of my medical decision-making.   EKG Interpretation None      MDM   Final diagnoses:  Toe pain, right  Peripheral artery disease ([place])    Patient with right lower extremity pallor of all toes, has pain, decreased cap refill, some loss of sensation. All has been progressive over the past week. Patient has history of  diabetes, hypertension, hyper lipidemia and peripheral artery disease and has not been able to take any medication other than metformin and Prilosec. At high risk for ischemic toes.   Patient now states he has been out of all of his medications for approximately 8 months. Dorsal pedis and posterior tibialis pulses were palpated, confirmed with Doppler. Basic labs were obtained, pain meds given, vascular surgery consultation. Labs pertinent for mild hyponatremia, mild hypochloremia - tx with IV fluids, sCr 1.46, pt with known CKD I ordered Hb A1C to be further followed with his PCP.  He may need more blood sugar control.  Also has had multiple medication compliance issues recently.  His wife was unaware of him not taking the majority of his meds over the past 8 months, so I suspect his general health may be compromised, and may be contributory to his progressive vasculopathy sx.  I have offered prescriptions for home meds, pt declined stating he has been unable to fill them at the pharmacy due to his insurance.  His wife states she will address the medication issues with insurance and with PCP.   Dr. Donnetta Hutching to consult in ED.    Dr. Donnetta Hutching has seen and evaluated the patient in the ER and has concerns for progressive arterial occlusion without concern for acute ischemia.  He states the patient is stable for discharge home tonight, and will be scheduled for CT arteriography tomorrow at 10:30 AM. Patient is to be NPO after midnight and report to the [facility] at 8:30 AM.  Dr. Donnetta Hutching requested I prescribe pain meds for his severe ischemia. Percocet were prescribed and medications and side effects were reviewed with patient and his wife.   All details were printed on discharge summary and given to the patient.   Pt was HTN, likely due to severe pain.  Given PO dose of percocet to D/C home. No CP, SOB, HA  Medications  sodium chloride 0.9 % bolus 500 mL (0 mLs Intravenous Stopped 10/15/14 1713)  morphine 4  MG/ML injection 4 mg (4 mg Intravenous Given 10/15/14 1605)  oxyCODONE-acetaminophen (PERCOCET/ROXICET) 5-325 MG per tablet 2 tablet (2 tablets Oral Given 10/15/14 1804)   Filed Vitals:   10/15/14 1700 10/15/14 1715 10/15/14 1745 10/15/14 1800  BP: 182/74 171/72 177/79 165/87  Pulse: 73 69 75 81  Temp:      TempSrc:      Resp:  Height:      Weight:      SpO2: 97% 98% 95% 100%      Delsa Grana, PA-C 10/16/14 1212  Dorie Rank, MD 10/19/14 0020

## 2014-10-16 ENCOUNTER — Encounter (HOSPITAL_COMMUNITY)
Admission: RE | Disposition: A | Discharge status: Home / Self Care | Payer: Commercial Managed Care - HMO | Source: Ambulatory Visit | Attending: Surgery

## 2014-10-16 ENCOUNTER — Ambulatory Visit (HOSPITAL_COMMUNITY)
Admission: RE | Admit: 2014-10-16 | Discharge: 2014-10-17 | Disposition: A | Discharge status: Home / Self Care | Payer: Commercial Managed Care - HMO | Source: Ambulatory Visit | Attending: Surgery | Admitting: Surgery

## 2014-10-16 DIAGNOSIS — L97919 Non-pressure chronic ulcer of unspecified part of right lower leg with unspecified severity: Secondary | ICD-10-CM | POA: Insufficient documentation

## 2014-10-16 DIAGNOSIS — N183 Chronic kidney disease, stage 3 (moderate): Secondary | ICD-10-CM | POA: Diagnosis not present

## 2014-10-16 DIAGNOSIS — E119 Type 2 diabetes mellitus without complications: Secondary | ICD-10-CM | POA: Insufficient documentation

## 2014-10-16 DIAGNOSIS — I70239 Atherosclerosis of native arteries of right leg with ulceration of unspecified site: Secondary | ICD-10-CM | POA: Diagnosis not present

## 2014-10-16 DIAGNOSIS — I70221 Atherosclerosis of native arteries of extremities with rest pain, right leg: Secondary | ICD-10-CM | POA: Diagnosis not present

## 2014-10-16 DIAGNOSIS — I129 Hypertensive chronic kidney disease with stage 1 through stage 4 chronic kidney disease, or unspecified chronic kidney disease: Secondary | ICD-10-CM | POA: Insufficient documentation

## 2014-10-16 DIAGNOSIS — I739 Peripheral vascular disease, unspecified: Secondary | ICD-10-CM | POA: Diagnosis present

## 2014-10-16 DIAGNOSIS — I10 Essential (primary) hypertension: Secondary | ICD-10-CM | POA: Diagnosis not present

## 2014-10-16 HISTORY — PX: PERIPHERAL VASCULAR CATHETERIZATION: SHX172C

## 2014-10-16 LAB — GLUCOSE, CAPILLARY
GLUCOSE-CAPILLARY: 167 mg/dL — AB (ref 65–99)
Glucose-Capillary: 181 mg/dL — ABNORMAL HIGH (ref 65–99)
Glucose-Capillary: 132 mg/dL — ABNORMAL HIGH (ref 65–99)

## 2014-10-16 LAB — POCT ACTIVATED CLOTTING TIME
ACTIVATED CLOTTING TIME: 184 s
ACTIVATED CLOTTING TIME: 177 s
ACTIVATED CLOTTING TIME: 184 s
Activated Clotting Time: 226 seconds

## 2014-10-16 LAB — HEMOGLOBIN A1C
Hgb A1c MFr Bld: 9.4 % — ABNORMAL HIGH (ref 4.8–5.6)
Mean Plasma Glucose: 223 mg/dL

## 2014-10-16 SURGERY — ABDOMINAL AORTOGRAM W/LOWER EXTREMITY
Anesthesia: LOCAL

## 2014-10-16 MED ORDER — ONDANSETRON HCL 4 MG/2ML IJ SOLN: 4.0000 mg | Freq: Four times a day (QID) | INTRAMUSCULAR | Status: DC | PRN

## 2014-10-16 MED ORDER — MORPHINE SULFATE (PF) 10 MG/ML IV SOLN: 2.0000 mg | Freq: Once | INTRAVENOUS | Status: DC

## 2014-10-16 MED ORDER — ACETAMINOPHEN 325 MG PO TABS: 325.0000 mg | ORAL_TABLET | ORAL | Status: DC | PRN

## 2014-10-16 MED ORDER — HEPARIN SODIUM (PORCINE) 1000 UNIT/ML IJ SOLN
INTRAMUSCULAR | Status: AC
  Filled 2014-10-16: qty 1

## 2014-10-16 MED ORDER — LIDOCAINE HCL (PF) 1 % IJ SOLN
INTRAMUSCULAR | Status: AC
  Filled 2014-10-16: qty 30

## 2014-10-16 MED ORDER — MIDAZOLAM HCL 2 MG/2ML IJ SOLN
INTRAMUSCULAR | Status: AC
  Filled 2014-10-16: qty 4

## 2014-10-16 MED ORDER — MORPHINE SULFATE (PF) 10 MG/ML IV SOLN
2.0000 mg | INTRAVENOUS | Status: DC | PRN
  Administered 2014-10-16: 2 mg via INTRAVENOUS

## 2014-10-16 MED ORDER — CLOPIDOGREL BISULFATE 75 MG PO TABS
300.0000 mg | ORAL_TABLET | Freq: Once | ORAL | Status: AC
  Administered 2014-10-16: 300 mg via ORAL
  Filled 2014-10-16: qty 4

## 2014-10-16 MED ORDER — HYDRALAZINE HCL 20 MG/ML IJ SOLN
INTRAMUSCULAR | Status: AC
  Filled 2014-10-16: qty 1

## 2014-10-16 MED ORDER — METOPROLOL TARTRATE 1 MG/ML IV SOLN: 2.0000 mg | INTRAVENOUS | Status: DC | PRN

## 2014-10-16 MED ORDER — AMLODIPINE BESYLATE 10 MG PO TABS
10.0000 mg | ORAL_TABLET | Freq: Every morning | ORAL | Status: DC
Start: 2014-10-17 — End: 2014-10-17
  Administered 2014-10-17: 10 mg via ORAL
  Filled 2014-10-16: qty 1

## 2014-10-16 MED ORDER — FENTANYL CITRATE (PF) 100 MCG/2ML IJ SOLN
INTRAMUSCULAR | Status: AC
  Filled 2014-10-16: qty 4

## 2014-10-16 MED ORDER — IODIXANOL 320 MG/ML IV SOLN
INTRAVENOUS | Status: DC | PRN
Start: 2014-10-16 — End: 2014-10-16
  Administered 2014-10-16: 170 mL via INTRA_ARTERIAL

## 2014-10-16 MED ORDER — HYDRALAZINE HCL 20 MG/ML IJ SOLN
5.0000 mg | INTRAMUSCULAR | Status: DC | PRN
  Administered 2014-10-16: 5 mg via INTRAVENOUS

## 2014-10-16 MED ORDER — SODIUM CHLORIDE 0.9 % IV SOLN
INTRAVENOUS | Status: DC
  Administered 2014-10-16: 11:00:00 via INTRAVENOUS

## 2014-10-16 MED ORDER — MIDAZOLAM HCL 2 MG/2ML IJ SOLN
INTRAMUSCULAR | Status: DC | PRN
  Administered 2014-10-16: 1 mg via INTRAVENOUS

## 2014-10-16 MED ORDER — OXYCODONE-ACETAMINOPHEN 5-325 MG PO TABS
1.0000 | ORAL_TABLET | Freq: Four times a day (QID) | ORAL | Status: DC | PRN
  Administered 2014-10-16: 1 via ORAL
  Filled 2014-10-16: qty 1

## 2014-10-16 MED ORDER — HEPARIN SODIUM (PORCINE) 1000 UNIT/ML IJ SOLN
INTRAMUSCULAR | Status: DC | PRN
  Administered 2014-10-16: 11000 [IU] via INTRAVENOUS
  Administered 2014-10-16: 2000 [IU] via INTRAVENOUS

## 2014-10-16 MED ORDER — FENTANYL CITRATE (PF) 100 MCG/2ML IJ SOLN
INTRAMUSCULAR | Status: DC | PRN
  Administered 2014-10-16: 25 ug via INTRAVENOUS

## 2014-10-16 MED ORDER — HEPARIN (PORCINE) IN NACL 2-0.9 UNIT/ML-% IJ SOLN
INTRAMUSCULAR | Status: AC
  Filled 2014-10-16: qty 1000

## 2014-10-16 MED ORDER — ACETAMINOPHEN 325 MG RE SUPP: 325.0000 mg | RECTAL | Status: DC | PRN

## 2014-10-16 MED ORDER — LIDOCAINE HCL (PF) 1 % IJ SOLN
INTRAMUSCULAR | Status: DC | PRN
  Administered 2014-10-16: 16:00:00

## 2014-10-16 MED ORDER — HYDROCODONE-ACETAMINOPHEN 5-325 MG PO TABS
1.0000 | ORAL_TABLET | Freq: Four times a day (QID) | ORAL | Status: DC | PRN
  Administered 2014-10-17: 1 via ORAL
  Filled 2014-10-16: qty 1

## 2014-10-16 MED ORDER — DOCUSATE SODIUM 100 MG PO CAPS
100.0000 mg | ORAL_CAPSULE | Freq: Every day | ORAL | Status: DC
  Administered 2014-10-17: 100 mg via ORAL
  Filled 2014-10-16: qty 1

## 2014-10-16 MED ORDER — GUAIFENESIN-DM 100-10 MG/5ML PO SYRP: 15.0000 mL | ORAL_SOLUTION | ORAL | Status: DC | PRN

## 2014-10-16 MED ORDER — SODIUM CHLORIDE 0.9 % IV SOLN: INTRAVENOUS | Status: DC

## 2014-10-16 MED ORDER — MORPHINE SULFATE (PF) 2 MG/ML IV SOLN: 2.0000 mg | INTRAVENOUS | Status: DC | PRN

## 2014-10-16 MED ORDER — MORPHINE SULFATE (PF) 2 MG/ML IV SOLN
INTRAVENOUS | Status: AC
  Filled 2014-10-16: qty 1

## 2014-10-16 MED ORDER — LABETALOL HCL 5 MG/ML IV SOLN: 10.0000 mg | INTRAVENOUS | Status: DC | PRN

## 2014-10-16 MED ORDER — ALUM & MAG HYDROXIDE-SIMETH 200-200-20 MG/5ML PO SUSP: 15.0000 mL | ORAL | Status: DC | PRN

## 2014-10-16 MED ORDER — CLOPIDOGREL BISULFATE 75 MG PO TABS: 75.0000 mg | ORAL_TABLET | Freq: Every day | ORAL | Status: DC

## 2014-10-16 MED ORDER — PHENOL 1.4 % MT LIQD: 1.0000 | OROMUCOSAL | Status: DC | PRN

## 2014-10-16 SURGICAL SUPPLY — 26 items
BALLOON POWERFLX PRO 6X40X135 (BALLOONS) ×2 IMPLANT
CATH OMNI FLUSH 5F 65CM (CATHETERS) ×4 IMPLANT
CATH QUICKCROSS .035X135CM (MICROCATHETER) ×2 IMPLANT
COVER PRB 48X5XTLSCP FOLD TPE (BAG) ×1 IMPLANT
COVER PROBE 5X48 (BAG) ×2
DEVICE CONTINUOUS FLUSH (MISCELLANEOUS) ×2 IMPLANT
DEVICE TORQUE H2O (MISCELLANEOUS) ×4 IMPLANT
DRAPE ZERO GRAVITY STERILE (DRAPES) ×2 IMPLANT
GUIDEWIRE ANGLED .035X260CM (WIRE) ×2 IMPLANT
KIT ENCORE 26 ADVANTAGE (KITS) ×2 IMPLANT
KIT MICROINTRODUCER STIFF 5F (SHEATH) ×2 IMPLANT
KIT PV (KITS) ×2 IMPLANT
SHEATH PINNACLE 5F 10CM (SHEATH) ×2 IMPLANT
SHEATH PINNACLE 7F 10CM (SHEATH) ×2 IMPLANT
SHEATH PINNACLE ST 7F 45CM (SHEATH) ×4 IMPLANT
STENT SMART FLEX 7X40X120 (Permanent Stent) ×2 IMPLANT
STENT SMART FLEX 7X60X120 (Permanent Stent) ×2 IMPLANT
SYR MEDRAD MARK V 150ML (SYRINGE) ×2 IMPLANT
TAPE RADIOPAQUE TURBO (MISCELLANEOUS) ×2 IMPLANT
TRANSDUCER W/STOPCOCK (MISCELLANEOUS) ×2 IMPLANT
TRAY PV CATH (CUSTOM PROCEDURE TRAY) ×2 IMPLANT
WIRE AMPLATZ SS-J .035X180CM (WIRE) ×2 IMPLANT
WIRE BENTSON .035X145CM (WIRE) ×2 IMPLANT
WIRE HI TORQ VERSACORE J 260CM (WIRE) ×2 IMPLANT
WIRE ROSEN-J .035X180CM (WIRE) ×2 IMPLANT
WIRE SPARTACORE .014X190CM (WIRE) ×2 IMPLANT

## 2014-10-16 NOTE — H&P (Signed)
Patient name: Daniel Musson BrightMRN: [id number] DOB: [birth year]Sex: male   Referred by: EDP  Reason for referral:  Chief Complaint  Patient presents with  . Foot Pain    HISTORY OF PRESENT ILLNESS: The patient is a 74 year old gentleman who presents with progressive pain in his right foot. He reports that the beginning approximate 4 days ago he started having worsening pain in his right foot and this is become to the point where he is now having rest pain. Was seen by his primary care physician was given Percocet 05/15/2023 but is only taking one every 8 hours. Reports that he is having relief when he hangs his foot dependently but when he puts his foot level with his body he has pain. He does have motor and sensory function intact in his foot. He has no history of any tissue loss. Reports discomfort in both legs with prolonged walking. He is morbidly obese and does not do a great deal of walking. Long history of hypertension and non-insulin-dependent diabetes.  Past Medical History  Diagnosis Date  . Diabetes mellitus without complication ([place])   . GERD (gastroesophageal reflux disease)   . Hyperlipidemia   . Hypercholesterolemia   . Peripheral arterial disease ([place])   . Diabetes mellitus type 2 with peripheral artery disease ([place])   . Benign hypertension with chronic kidney disease, stage III   . CKD (chronic kidney disease), stage III   . Numbness of toes     In the morning  . Carotid artery disease ([place])   . Obesity   . Discolored skin     Right leg, thinks it's from a bag that has rubbed against his leg for so long. Doppler 12/2010    Past Surgical History  Procedure Laterality Date  . Tonsillectomy    . Wisdom tooth extraction      Social History   Social History  . Marital Status: Married    Spouse Name: N/A  . Number of Children: N/A  . Years of  Education: N/A   Occupational History  . Not on file.   Social History Main Topics  . Smoking status: Never Smoker   . Smokeless tobacco: Never Used  . Alcohol Use: No  . Drug Use: No  . Sexual Activity: Not on file   Other Topics Concern  . Not on file   Social History Narrative    Family History  Problem Relation Age of Onset  . Stroke Father   . Irritable bowel syndrome Mother   . Heart disease Paternal Aunt     CABG  . Heart disease Cousin     CABG    Allergies as of 10/15/2014  . (No Known Allergies)    No current facility-administered medications on file prior to encounter.   Current Outpatient Prescriptions on File Prior to Encounter  Medication Sig Dispense Refill  . metFORMIN (GLUCOPHAGE) 850 MG tablet Take 850 mg by mouth every 8 (eight) hours.     Marland Kitchen omeprazole (PRILOSEC) 20 MG capsule Take 20 mg by mouth daily.    Marland Kitchen amLODipine (NORVASC) 10 MG tablet Take 10 mg by mouth every morning.       REVIEW OF SYSTEMS:  Reviewed in his history and physical with nothing to add  PHYSICAL EXAMINATION:  General: The patient is a well-nourished male, in no acute distress. Vital signs are BP 182/74 mmHg  Pulse 73  Temp(Src) 98.5 F (36.9 C) (Oral)  Resp 16  Ht 5' 6.5" (1.689  m)  Wt 257 lb (116.574 kg)  BMI 40.86 kg/m2  SpO2 97% Pulmonary: There is a good air exchange  Abdomen: Soft and non-tender . No masses noted Musculoskeletal: There are no major deformities.  Neurologic: No focal weakness or paresthesias are detected, Skin: There are no ulcer or rashes noted. Psychiatric: The patient has normal affect. Cardiovascular: 2+ radial and 2+ femoral pulses bilaterally. Has a 2+ left popliteal and left dorsalis pedis pulse. I do not palpate right popliteal or distal pulses Pitting edema bilaterally Cyanotic changes of the tips of the toes on his right foot which is  not present on his left foot.  Laboratory data is pending. Most recent creatinine in the system was 1.4  Impression and Plan:  Rest pain right foot. This is been progressive. No evidence of critical acute ischemia. Will obtain labs this evening. Will be discharged home this evening and will be admitted for outpatient arteriography tomorrow. Does appear to have superficial femoral occlusive disease and possible tibial disease as well. Explained that he will require a revascularization depending on his studies. Be nothing by mouth after midnight except for medications. Understands that Dr. Trula Slade will be doing the arteriogram tomorrow.     For angio.  Risks and benefits discussed  Annamarie Major

## 2014-10-16 NOTE — Op Note (Signed)
Patient name: Daniel Warner MRN: [id number] DOB: 08-10-1940 Sex: male  10/16/2014 Pre-operative Diagnosis: Right leg ulcer Post-operative diagnosis:  Same Surgeon:  Annamarie Major Procedure Performed:  1.  Ultrasound-guided access, left common femoral artery  2.  Abdominal aortogram  3.  Right lower extremity runoff  4.  Third order catheterization  5.  Stent, right superficial femoral artery 2     Indications:  The patient presented with a four-day history of a painful right foot.  He comes in for further evaluation and possible intervention.  Findings: 2 focal lesions within the right superficial femoral artery.  The first was associated with approximate 70% stenosis.  The second was a near occlusion.  Both were treated with 7 mm stents with residual stenosis less than 10%  Procedure:  The patient was identified in the holding area and taken to room 8.  The patient was then placed supine on the table and prepped and draped in the usual sterile fashion.  A time out was called.  Ultrasound was used to evaluate the left common femoral artery.  It was patent .  A digital ultrasound image was acquired.  A micropuncture needle was used to access the left common femoral artery under ultrasound guidance.  An 018 wire was advanced without resistance and a micropuncture sheath was placed.  The 018 wire was removed and a benson wire was placed.  The micropuncture sheath was exchanged for a 5 french sheath.  An omniflush catheter was advanced over the wire to the level of L-1.  An abdominal angiogram was obtained.  Next, using the omniflush catheter and a benson wire, the aortic bifurcation was crossed and the catheter was placed into theright external iliac artery and right runoff was obtained.   Findings:   Aortogram:  No significant renal artery stenosis.  The infrarenal abdominal aorta is widely patent.  Bilateral common and external iliac arteries are widely patent.  Right Lower Extremity:   The right common femoral profunda femoral artery are widely patent.  The superficial femoral artery is calcified.  In the proximal third of the artery there is tortuosity associated with severe calcification and approximately 80% stenosis.  At the adductor canal there is a near total occlusion.  There is luminal irregularity just proximal to the joint space.  The below knee popliteal artery is widely patent with two-vessel runoff via the posterior tibial and peroneal artery.  Anterior tibial artery is occluded.  Left Lower Extremity:  Not evaluated given contrast load to treat the right leg  Intervention:  After the above images were acquired, the decision was made to proceed with intervention.  Over a Amplatz Super Stiff wire, a 7 French sheath was inserted into the right external iliac artery.  The patient was fully heparinized.  I used a Glidewire and a quick cross to successfully cross all lesions and stay in the true lumen, avoiding a subintimal plane.  The proximal lesion was very difficult to cross despite its appearance angiographically.  I was able to get the quick cross catheter in the popliteal artery and a contrast injection was performed at this location confirming successful crossing of all the lesions.  A woolly wire was then placed.  I then treated the distal lesion using a 7 x 60 Cordis Smart flexed stent.  The proximal lesion was treated with a 7 x 40 Cordis Smart flex stent.  Both stents were molded to confirmation with a 6 mm balloon.  Completion angiography revealed resolution  of the stenosis and no change in the runoff.  The sheath and wire were withdrawn to the left external iliac artery and the wire was removed.  The patient was taken the holding area for sheath pull once his quite relation profile corrects  Impression:  #1  2 focal high-grade lesions within the right superficial femoral artery.  These were successfully treated with a 7 mm stents.  The first was in the proximal third of  the artery and second was at the adductor canal.  #2  2 vessel runoff via the posterior tibial and peroneal artery    V. Annamarie Major, M.D. [facility] Office: [phone] Pager:  [phone]

## 2014-10-16 NOTE — Progress Notes (Signed)
Admit Note:  Patient arrived to room 2w-14 from cath lab.  Patient is alert and oriented X 4 and in no distress. SR on Tele.  Peripheral IV in left hand patent and infusing NS @ 50 cc/hr.  Vital signs WNL.  Left groin is level 0.  Left dorsalis pedis pulse palpable.  Right dorsalis pedis pulse weak but palpable. Toes of right foot discolored and painful to touch.  Patient is on RA. Lungs CTA bilaterally.  Will continue to monitor patient.

## 2014-10-16 NOTE — CV Procedure (Signed)
Sheath removed from left femoral artery at 1730.  Manual pressure applied to left groin for 25 min.  Bedrest starts at 1800.  Palpable left DP pulse before and after sheath removal.  No complications.  Post sheath pull instructions given.  Pt understands and acknowledges.  Left groin access site is level 0 with no bleeding or hematoma.

## 2014-10-17 ENCOUNTER — Encounter (HOSPITAL_COMMUNITY): Payer: Self-pay | Admitting: Surgery

## 2014-10-17 DIAGNOSIS — I70221 Atherosclerosis of native arteries of extremities with rest pain, right leg: Secondary | ICD-10-CM | POA: Diagnosis not present

## 2014-10-17 DIAGNOSIS — I70239 Atherosclerosis of native arteries of right leg with ulceration of unspecified site: Secondary | ICD-10-CM | POA: Diagnosis not present

## 2014-10-17 LAB — BASIC METABOLIC PANEL
ANION GAP: 8 (ref 5–15)
BUN: 13 mg/dL (ref 6–20)
CO2: 22 mmol/L (ref 22–32)
Calcium: 8.6 mg/dL — ABNORMAL LOW (ref 8.9–10.3)
Chloride: 106 mmol/L (ref 101–111)
Creatinine, Ser: 1.38 mg/dL — ABNORMAL HIGH (ref 0.61–1.24)
GFR calc non Af Amer: 49 mL/min — ABNORMAL LOW (ref >60)
GFR, EST AFRICAN AMERICAN: 57 mL/min — AB (ref >60)
GLUCOSE: 128 mg/dL — AB (ref 65–99)
POTASSIUM: 4.8 mmol/L (ref 3.5–5.1)
Sodium: 136 mmol/L (ref 135–145)

## 2014-10-17 LAB — CBC
HEMATOCRIT: 33.3 % — AB (ref 39.0–52.0)
HEMOGLOBIN: 11.6 g/dL — AB (ref 13.0–17.0)
MCH: 26.7 pg (ref 26.0–34.0)
MCHC: 34.8 g/dL (ref 30.0–36.0)
MCV: 76.7 fL — ABNORMAL LOW (ref 78.0–100.0)
Platelets: 173 10*3/uL (ref 150–400)
RBC: 4.34 MIL/uL (ref 4.22–5.81)
RDW: 15.9 % — ABNORMAL HIGH (ref 11.5–15.5)
WBC: 8.6 10*3/uL (ref 4.0–10.5)

## 2014-10-17 LAB — GLUCOSE, CAPILLARY
GLUCOSE-CAPILLARY: 144 mg/dL — AB (ref 65–99)
GLUCOSE-CAPILLARY: 193 mg/dL — AB (ref 65–99)

## 2014-10-17 MED ORDER — CLOPIDOGREL BISULFATE 75 MG PO TABS: 75.0000 mg | ORAL_TABLET | Freq: Every day | ORAL | Status: DC

## 2014-10-17 MED ORDER — OXYCODONE-ACETAMINOPHEN 5-325 MG PO TABS: 1.0000 | ORAL_TABLET | Freq: Four times a day (QID) | ORAL | Status: DC | PRN

## 2014-10-17 MED ORDER — ATORVASTATIN CALCIUM 10 MG PO TABS: 10.0000 mg | ORAL_TABLET | Freq: Every day | ORAL | Status: DC

## 2014-10-17 MED FILL — Morphine Sulfate Inj 2 MG/ML: INTRAMUSCULAR | Qty: 1 | Status: AC

## 2014-10-17 NOTE — Care Management Note (Signed)
Case Management Note  Patient Details  Name: LEONE PUTMAN MRN: [id number] Date of Birth: 18-May-1940  Subjective/Objective:    Pt admitted with PAD                Action/Plan:  Pt is independent from home with wife.     Expected Discharge Date:                  Expected Discharge Plan:  Home/Self Care  In-House Referral:     Discharge planning Services  CM Consult  Post Acute Care Choice:    Choice offered to:     DME Arranged:    DME Agency:     HH Arranged:    [place] Agency:     Status of Service:  Completed, signed off  Medicare Important Message Given:    Date Medicare IM Given:    Medicare IM give by:    Date Additional Medicare IM Given:    Additional Medicare Important Message give by:     If discussed at [place] of Stay Meetings, dates discussed:    Additional Comments: CM assessed pt; no CM needs determined prior to discharge Maryclare Labrador, RN 10/17/2014, 11:17 AM

## 2014-10-17 NOTE — Progress Notes (Signed)
    Subjective  - POD #1  Still with right foot pain this am Able to ambulate on his own   Physical Exam:  Doppler signals in right PT/ATA (R) Left groin soft Right 5th toe blue and painful (unchanged)     Assessment/Plan:  POD #1  Renal:  No change in creatinine after angio and overnight hydration Chol:  Will start on Lipitor 10 mg.  Patient will have this evaluated and followed up on by PCP on Oct 12 Pain:  Rx for Meds Discussed possible toe amp if pain does not improve Start Plavix and ASA81 mg F/u 1 month with Dr. Unknown Foley, [place] 10/17/2014 8:46 AM --  Danley Danker Vitals:   10/17/14 0551  BP: 169/72  Pulse: 76  Temp: 98.1 F (36.7 C)  Resp: 18    Intake/Output Summary (Last 24 hours) at 10/17/14 0846 Last data filed at 10/17/14 0813  Gross per 24 hour  Intake 1247.5 ml  Output   1725 ml  Net -477.5 ml     Laboratory CBC    Component Value Date/Time   WBC 8.6 10/17/2014 0402   HGB 11.6* 10/17/2014 0402   HCT 33.3* 10/17/2014 0402   PLT 173 10/17/2014 0402    BMET    Component Value Date/Time   NA 136 10/17/2014 0402   K 4.8 10/17/2014 0402   CL 106 10/17/2014 0402   CO2 22 10/17/2014 0402   GLUCOSE 128* 10/17/2014 0402   BUN 13 10/17/2014 0402   CREATININE 1.38* 10/17/2014 0402   CALCIUM 8.6* 10/17/2014 0402   GFRNONAA 49* 10/17/2014 0402   GFRAA 57* 10/17/2014 0402    COAG Lab Results  Component Value Date   INR 1.11 10/15/2014   No results found for: PTT  Antibiotics Anti-infectives    None       V. Leia Alf, M.D. [facility] Office: [phone] Pager:  [phone]

## 2014-10-17 NOTE — Progress Notes (Signed)
Discharge Note:  Patient alert and oriented X 4 and in no apparent distress. Patient, his wife, and his sister given discharge instructions regarding medications, signs and symptoms of infection to report, activity, diet, and upcoming appointments.  They all verbalized understanding of instructions. Telemetry and peripheral IV removed.  Patient confirmed that he had all of his belongings.  Patient transported out via wheelchair by hospital volunteer.

## 2014-10-18 ENCOUNTER — Telehealth: Payer: Self-pay | Admitting: Vascular Surgery

## 2014-10-18 NOTE — Telephone Encounter (Addendum)
-----   Message from Mena Goes, RN sent at 10/17/2014 10:28 AM EDT ----- Regarding: Schedule   ----- Message -----    From: Dario Ave    Sent: 10/17/2014   9:35 AM      To: [facility] Charge Pool Subject: Kay's log                                        ----- Message -----    From: Serafina Mitchell, MD    Sent: 10/17/2014   8:49 AM      To: [facility] Charge Pool  10/17/2014:  F/u hospital visit   Office f/u with Dr. Donnetta Hutching in 1 month with duplex of R LE and ABI's  notified patient's wife of fu appt. with dr. early.  vasc. lab on 11-16-14 at 10:30 and to see dr. early on 11-20-14 at 1:45pm

## 2014-10-19 ENCOUNTER — Telehealth: Payer: Self-pay

## 2014-10-19 NOTE — Telephone Encounter (Signed)
Pt's. wife called.  Reported pt. usually has regular BM's daily, but hasn't had a BM, since prior to Angiogram of 10/16/14.  Denied that pt. has any abdominal distension or nausea/ vomiting.  Reported that pt. is passing gas.  Encouraged to have pt. increase oral fluids that are decaffeinated, to increase mobility, to increase fiber in diet, or take a fiber supplement, daily.  Also advised he can add a stool softener, over the counter, such as Colace or Docusate Sodium.  Advised to take a dose of MOM 30 cc at this time, to initiate a BM, and to add-in the above recommendations, daily.  Advised to call back if no improvement.  Wife verbalized understanding.

## 2014-10-19 NOTE — Discharge Summary (Signed)
[facility] Discharge Summary   Patient ID:  Daniel Warner MRN: [id number] DOB/AGE: [birth year] 74 y.o.  Admit date: 10/16/2014 Discharge date: 10/17/2014 Date of Surgery: 10/16/2014 Surgeon: Surgeon(s): Serafina Mitchell, MD  Admission Diagnosis: claudication  Discharge Diagnoses:  claudication  Secondary Diagnoses: Past Medical History  Diagnosis Date  . Diabetes mellitus without complication ([place])   . GERD (gastroesophageal reflux disease)   . Hyperlipidemia   . Hypercholesterolemia   . Peripheral arterial disease ([employer])   . Diabetes mellitus type 2 with peripheral artery disease ([place])   . Benign hypertension with chronic kidney disease, stage III   . CKD (chronic kidney disease), stage III   . Numbness of toes     In the morning  . Carotid artery disease ([place])   . Obesity   . Discolored skin     Right leg, thinks it's from a bag that has rubbed against his leg for so long. Doppler 12/2010    Procedure(s): Abdominal Aortogram w/Lower Extremity  Discharged Condition: good  HPI: The patient is a 74 year old gentleman who presents with progressive pain in his right foot. He reports that the beginning approximate 4 days ago he started having worsening pain in his right foot and this is become to the point where he is now having rest pain. Was seen by his primary care physician was given Percocet 05/15/2023 but is only taking one every 8 hours. Reports that he is having relief when he hangs his foot dependently but when he puts his foot level with his body he has pain. He does have motor and sensory function intact in his foot. He has no history of any tissue loss. Reports discomfort in both legs with prolonged walking. He is morbidly obese and does not do a great deal of walking. Long history of hypertension and non-insulin-dependent diabetes.  Rest pain right foot. This is been progressive. No evidence of critical acute ischemia. Will obtain labs this  evening. Will be discharged home this evening and will be admitted for outpatient arteriography tomorrow. Does appear to have superficial femoral occlusive disease and possible tibial disease as well. Explained that he will require a revascularization depending on his studies. Be nothing by mouth after midnight except for medications. Understands that Dr. Trula Slade will be doing the arteriogram 10/16/2014  Hospital Course:  Daniel Warner is a 74 y.o. male is S/P  Procedure(s): Abdominal Aortogram w/Lower Extremity   1. Ultrasound-guided access, left common femoral artery 2. Abdominal aortogram 3. Right lower extremity runoff 4. Third order catheterization 5. Stent, right superficial femoral artery 2  Findings: 2 focal lesions within the right superficial femoral artery. The first was associated with approximate 70% stenosis. The second was a near occlusion. Both were treated with 7 mm stents with residual stenosis less than 10%. POD#1  Renal: No change in creatinine after angio and overnight hydration Chol: Will start on Lipitor 10 mg. Patient will have this evaluated and followed up on by PCP on Oct 12 Pain: Rx for Meds Discussed possible toe amp if pain does not improve Start Plavix and ASA81 mg F/u 1 month with Dr. Donnetta Hutching    Significant Diagnostic Studies: CBC Lab Results  Component Value Date   WBC 8.6 10/17/2014   HGB 11.6* 10/17/2014   HCT 33.3* 10/17/2014   MCV 76.7* 10/17/2014   PLT 173 10/17/2014    BMET    Component Value Date/Time   NA 136 10/17/2014 0402   K 4.8 10/17/2014 0402  CL 106 10/17/2014 0402   CO2 22 10/17/2014 0402   GLUCOSE 128* 10/17/2014 0402   BUN 13 10/17/2014 0402   CREATININE 1.38* 10/17/2014 0402   CALCIUM 8.6* 10/17/2014 0402   GFRNONAA 49* 10/17/2014 0402   GFRAA 57* 10/17/2014 0402   COAG Lab Results  Component Value Date   INR 1.11 10/15/2014      Disposition:  Discharge to :Home Discharge Instructions    Call MD for:  redness, tenderness, or signs of infection (pain, swelling, bleeding, redness, odor or green/yellow discharge around incision site)    Complete by:  As directed      Call MD for:  severe or increased pain, loss or decreased feeling  in affected limb(s)    Complete by:  As directed      Call MD for:  temperature >100.5    Complete by:  As directed      Discharge instructions    Complete by:  As directed   You may shower in 24 hours dry band aide to groin stick site as needed     Driving Restrictions    Complete by:  As directed   No driving for 24 hours or until you no longer need narcotic pain medication     Lifting restrictions    Complete by:  As directed   No lifting for 4 weeks     Resume previous diet    Complete by:  As directed             Medication List    STOP taking these medications        amLODipine 10 MG tablet  Commonly known as:  NORVASC     HYDROcodone-acetaminophen 5-325 MG tablet  Commonly known as:  NORCO/VICODIN      TAKE these medications        atorvastatin 10 MG tablet  Commonly known as:  LIPITOR  Take 1 tablet (10 mg total) by mouth daily.     clopidogrel 75 MG tablet  Commonly known as:  PLAVIX  Take 1 tablet (75 mg total) by mouth daily with breakfast.     metFORMIN 850 MG tablet  Commonly known as:  GLUCOPHAGE  Take 850 mg by mouth every 8 (eight) hours.     omeprazole 20 MG capsule  Commonly known as:  PRILOSEC  Take 20 mg by mouth daily.     oxyCODONE-acetaminophen 5-325 MG tablet  Commonly known as:  PERCOCET  Take 1 tablet by mouth every 6 (six) hours as needed for severe pain.     oxyCODONE-acetaminophen 5-325 MG tablet  Commonly known as:  PERCOCET  Take 1 tablet by mouth every 6 (six) hours as needed for severe pain.       Verbal and written Discharge instructions given to the patient. Wound care per Discharge AVS     Follow-up  Information    Follow up with Curt Jews, MD In 4 weeks.   Specialties:  Vascular Surgery, Cardiology   Why:  Office will call you to arrange your appt (sent)   Contact information:   [place] [address] [phone]       Signed: Laurence Slate [facility] 10/19/2014, 12:28 PM

## 2014-11-15 ENCOUNTER — Other Ambulatory Visit: Payer: Self-pay

## 2014-11-15 DIAGNOSIS — I739 Peripheral vascular disease, unspecified: Secondary | ICD-10-CM

## 2014-11-16 ENCOUNTER — Ambulatory Visit (HOSPITAL_COMMUNITY)
Admit: 2014-11-16 | Discharge: 2014-11-16 | Disposition: A | Discharge status: Home / Self Care | Payer: Commercial Managed Care - HMO | Source: Ambulatory Visit | Attending: Surgery | Admitting: Surgery

## 2014-11-16 ENCOUNTER — Encounter: Payer: Self-pay | Admitting: Vascular Surgery

## 2014-11-16 ENCOUNTER — Ambulatory Visit (INDEPENDENT_AMBULATORY_CARE_PROVIDER_SITE_OTHER)
Admit: 2014-11-16 | Discharge: 2014-11-16 | Disposition: A | Discharge status: Home / Self Care | Payer: Commercial Managed Care - HMO | Attending: Surgery | Admitting: Surgery

## 2014-11-16 DIAGNOSIS — I739 Peripheral vascular disease, unspecified: Secondary | ICD-10-CM | POA: Insufficient documentation

## 2014-11-20 ENCOUNTER — Encounter: Payer: Self-pay | Admitting: Vascular Surgery

## 2014-11-20 ENCOUNTER — Ambulatory Visit (INDEPENDENT_AMBULATORY_CARE_PROVIDER_SITE_OTHER): Payer: Commercial Managed Care - HMO | Admitting: Vascular Surgery

## 2014-11-20 VITALS — BP 155/78 | HR 72 | Temp 97.6°F | Resp 16 | Ht 65.5 in | Wt 262.0 lb

## 2014-11-20 DIAGNOSIS — I739 Peripheral vascular disease, unspecified: Secondary | ICD-10-CM | POA: Diagnosis not present

## 2014-11-20 NOTE — Progress Notes (Signed)
The patient presents today for follow-up of recent right superficial femoral artery angioplasty and stenting. He presented to the emergency department on 10/15/2014 with progressive rest pain in his right foot and superficial tissue loss on the toes of his right foot. Felt that he did have an adequate flow for healing. He was taken to the anterior suite the following day with Dr. Trula Slade. He underwent aortogram with runoff was found to have high-grade stenosis in his right superficial femoral artery. He will underwent angioplasty and stenting of 2 different segments. He has had an excellent initial result. He has had complete resolution of his rest pain and he does very little walking but describes no claudication symptoms.  Past Medical History  Diagnosis Date  . Diabetes mellitus without complication ([place])   . GERD (gastroesophageal reflux disease)   . Hyperlipidemia   . Hypercholesterolemia   . Peripheral arterial disease ([place])   . Diabetes mellitus type 2 with peripheral artery disease ([place])   . Benign hypertension with chronic kidney disease, stage III   . CKD (chronic kidney disease), stage III   . Numbness of toes     In the morning  . Carotid artery disease ([place])   . Obesity   . Discolored skin     Right leg, thinks it's from a bag that has rubbed against his leg for so long. Doppler 12/2010    Social History  Substance Use Topics  . Smoking status: Never Smoker   . Smokeless tobacco: Never Used  . Alcohol Use: No    Family History  Problem Relation Age of Onset  . Stroke Father   . Irritable bowel syndrome Mother   . Heart disease Paternal Aunt     CABG  . Heart disease Cousin     CABG    No Known Allergies   Current outpatient prescriptions:  .  atorvastatin (LIPITOR) 10 MG tablet, Take 1 tablet (10 mg total) by mouth daily., Disp: 30 tablet, Rfl: 1 .  clopidogrel (PLAVIX) 75 MG tablet, Take 1 tablet (75 mg total) by mouth daily with breakfast., Disp: 30 tablet,  Rfl: 1 .  glimepiride (AMARYL) 4 MG tablet, Take 4 mg by mouth daily with breakfast., Disp: , Rfl:  .  metFORMIN (GLUCOPHAGE) 850 MG tablet, Take 850 mg by mouth every 8 (eight) hours. , Disp: , Rfl:  .  omeprazole (PRILOSEC) 20 MG capsule, Take 20 mg by mouth daily., Disp: , Rfl:  .  oxyCODONE-acetaminophen (PERCOCET) 5-325 MG tablet, Take 1 tablet by mouth every 6 (six) hours as needed for severe pain., Disp: 30 tablet, Rfl: 0 .  oxyCODONE-acetaminophen (PERCOCET) 5-325 MG tablet, Take 1 tablet by mouth every 6 (six) hours as needed for severe pain., Disp: 30 tablet, Rfl: 0  Filed Vitals:   11/20/14 1401 11/20/14 1404  BP: 165/77 155/78  Pulse: 72   Temp: 97.6 F (36.4 C)   TempSrc: Oral   Resp: 16   Height: 5' 5.5" (1.664 m)   Weight: 262 lb (118.842 kg)   SpO2: 97%     Body mass index is 42.92 kg/(m^2).       On physical exam his toes noted to have some dry gangrenous changes on the tips of several toes of his right foot and none on his left. He does have a palpable 1+ dorsalis pedis pulse on the right. He does have marked peripheral edema bilaterally. He is morbidly obese.  Noninvasive studies today were reviewed with the patient and his  wife present. He has normal ankle arm index bilaterally and has normal triphasic flow proximal and distal to the stent in his superficial femoral artery on the right.  Impression and plan excellent Darick Fetters result severe threatening ischemia on his right foot with the stenting of his right saphenofemoral artery. We'll continue his walking program and notify should he develop any new difficulty. We will see him again in 6 months for repeat noninvasive studies. He will see our nurse practitioner at the next visit.

## 2014-11-20 NOTE — Progress Notes (Signed)
Filed Vitals:   11/20/14 1401 11/20/14 1404  BP: 165/77 155/78  Pulse: 72   Temp: 97.6 F (36.4 C)   TempSrc: Oral   Resp: 16   Height: 5' 5.5" (1.664 m)   Weight: 262 lb (118.842 kg)   SpO2: 97%

## 2014-11-21 ENCOUNTER — Other Ambulatory Visit: Payer: Self-pay | Admitting: *Deleted

## 2014-11-28 ENCOUNTER — Encounter: Payer: Self-pay | Admitting: Podiatry

## 2014-11-28 ENCOUNTER — Ambulatory Visit (INDEPENDENT_AMBULATORY_CARE_PROVIDER_SITE_OTHER): Payer: Commercial Managed Care - HMO | Admitting: Podiatry

## 2014-11-28 DIAGNOSIS — M79676 Pain in unspecified toe(s): Secondary | ICD-10-CM

## 2014-11-28 DIAGNOSIS — B351 Tinea unguium: Secondary | ICD-10-CM

## 2014-11-28 NOTE — Patient Instructions (Addendum)
Removed Band-Aid on the left great toe in 24 hours and apply a small amount of topical antibiotic ointment to the end of the left big toe daily, cover with a Band-Aid until a scab forms  Diabetes and Foot Care Diabetes may cause you to have problems because of poor blood supply (circulation) to your feet and legs. This may cause the skin on your feet to become thinner, break easier, and heal more slowly. Your skin may become dry, and the skin may peel and crack. You may also have nerve damage in your legs and feet causing decreased feeling in them. You may not notice minor injuries to your feet that could lead to infections or more serious problems. Taking care of your feet is one of the most important things you can do for yourself.  HOME CARE INSTRUCTIONS  Wear shoes at all times, even in the house. Do not go barefoot. Bare feet are easily injured.  Check your feet daily for blisters, cuts, and redness. If you cannot see the bottom of your feet, use a mirror or ask someone for help.  Wash your feet with warm water (do not use hot water) and mild soap. Then pat your feet and the areas between your toes until they are completely dry. Do not soak your feet as this can dry your skin.  Apply a moisturizing lotion or petroleum jelly (that does not contain alcohol and is unscented) to the skin on your feet and to dry, brittle toenails. Do not apply lotion between your toes.  Trim your toenails straight across. Do not dig under them or around the cuticle. File the edges of your nails with an emery board or nail file.  Do not cut corns or calluses or try to remove them with medicine.  Wear clean socks or stockings every day. Make sure they are not too tight. Do not wear knee-high stockings since they may decrease blood flow to your legs.  Wear shoes that fit properly and have enough cushioning. To break in new shoes, wear them for just a few hours a day. This prevents you from injuring your feet. Always  look in your shoes before you put them on to be sure there are no objects inside.  Do not cross your legs. This may decrease the blood flow to your feet.  If you find a minor scrape, cut, or break in the skin on your feet, keep it and the skin around it clean and dry. These areas may be cleansed with mild soap and water. Do not cleanse the area with peroxide, alcohol, or iodine.  When you remove an adhesive bandage, be sure not to damage the skin around it.  If you have a wound, look at it several times a day to make sure it is healing.  Do not use heating pads or hot water bottles. They may burn your skin. If you have lost feeling in your feet or legs, you may not know it is happening until it is too late.  Make sure your health care provider performs a complete foot exam at least annually or more often if you have foot problems. Report any cuts, sores, or bruises to your health care provider immediately. SEEK MEDICAL CARE IF:   You have an injury that is not healing.  You have cuts or breaks in the skin.  You have an ingrown nail.  You notice redness on your legs or feet.  You feel burning or tingling in your legs  or feet.  You have pain or cramps in your legs and feet.  Your legs or feet are numb.  Your feet always feel cold. SEEK IMMEDIATE MEDICAL CARE IF:   There is increasing redness, swelling, or pain in or around a wound.  There is a red line that goes up your leg.  Pus is coming from a wound.  You develop a fever or as directed by your health care provider.  You notice a bad smell coming from an ulcer or wound.   This information is not intended to replace advice given to you by your health care provider. Make sure you discuss any questions you have with your health care provider.   Document Released: 12/27/1999 Document Revised: 08/31/2012 Document Reviewed: 06/07/2012 Elsevier Interactive Patient Education [employer]. .Marland Kitchen

## 2014-11-29 NOTE — Progress Notes (Signed)
Patient ID: HOBERT POPLASKI, male   DOB: [birth year], 74 y.o.   MRN: [id number]  Subjective: This patient presents today for debridement of painful toenails which are uncomfortable walking wearing shoes. Blasts service for this similar problem was on 11/15/2013. Patient has undergone recent arterial stenting right lower extremity for treatment of gangrenous toes in the right foot.  Objective: Dry patchy gangrenous changes distal fifth right toe Dry scaling dark skin right hallux that sloughs easily with normal texture skin in the hallux The toenails are extremely elongated, hypertrophic, deformed, incurvated and tender to direct palpation 6-10  Assessment: Diabetic with a history of peripheral arterial disease with surgical intervention for gangrenous toe changes right foot Neglected symptomatic onychomycoses 6-10  Plan: Debridement toenails 10 mechanically and electronically slight bleeding distal left hallux noted which was treated with topical antibiotic ointment and Band-Aid. Patient made aware of this and was advised to leave the Band-Aid on 24 hours and remove and apply topical antibiotic ointment and a Band-Aid to the end of the left big toe daily until a scab forms  . Reappoint at three-month intervals for nail debridement or sooner if patient has a concern

## 2015-02-19 DIAGNOSIS — Z79899 Other long term (current) drug therapy: Secondary | ICD-10-CM | POA: Diagnosis not present

## 2015-02-19 DIAGNOSIS — Z7984 Long term (current) use of oral hypoglycemic drugs: Secondary | ICD-10-CM | POA: Diagnosis not present

## 2015-02-19 DIAGNOSIS — Z6841 Body Mass Index (BMI) 40.0 and over, adult: Secondary | ICD-10-CM | POA: Diagnosis not present

## 2015-02-19 DIAGNOSIS — E1151 Type 2 diabetes mellitus with diabetic peripheral angiopathy without gangrene: Secondary | ICD-10-CM | POA: Diagnosis not present

## 2015-02-19 DIAGNOSIS — E7800 Pure hypercholesterolemia, unspecified: Secondary | ICD-10-CM | POA: Diagnosis not present

## 2015-02-19 DIAGNOSIS — I10 Essential (primary) hypertension: Secondary | ICD-10-CM | POA: Diagnosis not present

## 2015-02-27 ENCOUNTER — Encounter: Payer: Self-pay | Admitting: Podiatry

## 2015-02-27 ENCOUNTER — Ambulatory Visit (INDEPENDENT_AMBULATORY_CARE_PROVIDER_SITE_OTHER): Payer: PPO | Admitting: Podiatry

## 2015-02-27 DIAGNOSIS — M79676 Pain in unspecified toe(s): Secondary | ICD-10-CM | POA: Diagnosis not present

## 2015-02-27 DIAGNOSIS — B351 Tinea unguium: Secondary | ICD-10-CM | POA: Diagnosis not present

## 2015-02-27 NOTE — Patient Instructions (Signed)
Diabetes and Foot Care Diabetes may cause you to have problems because of poor blood supply (circulation) to your feet and legs. This may cause the skin on your feet to become thinner, break easier, and heal more slowly. Your skin may become dry, and the skin may peel and crack. You may also have nerve damage in your legs and feet causing decreased feeling in them. You may not notice minor injuries to your feet that could lead to infections or more serious problems. Taking care of your feet is one of the most important things you can do for yourself.  HOME CARE INSTRUCTIONS  Wear shoes at all times, even in the house. Do not go barefoot. Bare feet are easily injured.  Check your feet daily for blisters, cuts, and redness. If you cannot see the bottom of your feet, use a mirror or ask someone for help.  Wash your feet with warm water (do not use hot water) and mild soap. Then pat your feet and the areas between your toes until they are completely dry. Do not soak your feet as this can dry your skin.  Apply a moisturizing lotion or petroleum jelly (that does not contain alcohol and is unscented) to the skin on your feet and to dry, brittle toenails. Do not apply lotion between your toes.  Trim your toenails straight across. Do not dig under them or around the cuticle. File the edges of your nails with an emery board or nail file.  Do not cut corns or calluses or try to remove them with medicine.  Wear clean socks or stockings every day. Make sure they are not too tight. Do not wear knee-high stockings since they may decrease blood flow to your legs.  Wear shoes that fit properly and have enough cushioning. To break in new shoes, wear them for just a few hours a day. This prevents you from injuring your feet. Always look in your shoes before you put them on to be sure there are no objects inside.  Do not cross your legs. This may decrease the blood flow to your feet.  If you find a minor scrape,  cut, or break in the skin on your feet, keep it and the skin around it clean and dry. These areas may be cleansed with mild soap and water. Do not cleanse the area with peroxide, alcohol, or iodine.  When you remove an adhesive bandage, be sure not to damage the skin around it.  If you have a wound, look at it several times a day to make sure it is healing.  Do not use heating pads or hot water bottles. They may burn your skin. If you have lost feeling in your feet or legs, you may not know it is happening until it is too late.  Make sure your health care provider performs a complete foot exam at least annually or more often if you have foot problems. Report any cuts, sores, or bruises to your health care provider immediately. SEEK MEDICAL CARE IF:   You have an injury that is not healing.  You have cuts or breaks in the skin.  You have an ingrown nail.  You notice redness on your legs or feet.  You feel burning or tingling in your legs or feet.  You have pain or cramps in your legs and feet.  Your legs or feet are numb.  Your feet always feel cold. SEEK IMMEDIATE MEDICAL CARE IF:   There is increasing redness,   swelling, or pain in or around a wound.  There is a red line that goes up your leg.  Pus is coming from a wound.  You develop a fever or as directed by your health care provider.  You notice a bad smell coming from an ulcer or wound.   This information is not intended to replace advice given to you by your health care provider. Make sure you discuss any questions you have with your health care provider.   Document Released: 12/27/1999 Document Revised: 08/31/2012 Document Reviewed: 06/07/2012 Elsevier Interactive Patient Education 2016 Elsevier Inc.  

## 2015-02-27 NOTE — Progress Notes (Signed)
Patient ID: Daniel Warner, male   DOB: Oct 27, 1940, 75 y.o.   MRN: [id number]  Subjective: This patient presents today complaining of thickened and elongated toenails which are uncomfortable walking wearing shoes and request nail debridement  Objective: No open skin lesions bilaterally The toenails are elongated, brittle, deformed, tender to direct palpation 6-10  Assessment: Symptomatic onychomycoses 6-10 Diabetic with a history of peripheral arterial disease  Plan: Debridement toenails 6-10 mechanically and electronically without any bleeding  Reappoint 3 months

## 2015-04-15 DIAGNOSIS — H401122 Primary open-angle glaucoma, left eye, moderate stage: Secondary | ICD-10-CM | POA: Diagnosis not present

## 2015-04-15 DIAGNOSIS — H401112 Primary open-angle glaucoma, right eye, moderate stage: Secondary | ICD-10-CM | POA: Diagnosis not present

## 2015-05-01 DIAGNOSIS — H401112 Primary open-angle glaucoma, right eye, moderate stage: Secondary | ICD-10-CM | POA: Diagnosis not present

## 2015-05-08 DIAGNOSIS — H401122 Primary open-angle glaucoma, left eye, moderate stage: Secondary | ICD-10-CM | POA: Diagnosis not present

## 2015-05-21 ENCOUNTER — Ambulatory Visit: Payer: Commercial Managed Care - HMO | Admitting: Family

## 2015-05-21 ENCOUNTER — Encounter (HOSPITAL_COMMUNITY): Payer: Commercial Managed Care - HMO

## 2015-05-29 ENCOUNTER — Encounter: Payer: Self-pay | Admitting: Podiatry

## 2015-05-29 ENCOUNTER — Ambulatory Visit (INDEPENDENT_AMBULATORY_CARE_PROVIDER_SITE_OTHER): Payer: PPO | Admitting: Podiatry

## 2015-05-29 DIAGNOSIS — B351 Tinea unguium: Secondary | ICD-10-CM

## 2015-05-29 DIAGNOSIS — M79676 Pain in unspecified toe(s): Secondary | ICD-10-CM

## 2015-05-29 NOTE — Patient Instructions (Signed)
Diabetes and Foot Care Diabetes may cause you to have problems because of poor blood supply (circulation) to your feet and legs. This may cause the skin on your feet to become thinner, break easier, and heal more slowly. Your skin may become dry, and the skin may peel and crack. You may also have nerve damage in your legs and feet causing decreased feeling in them. You may not notice minor injuries to your feet that could lead to infections or more serious problems. Taking care of your feet is one of the most important things you can do for yourself.  HOME CARE INSTRUCTIONS  Wear shoes at all times, even in the house. Do not go barefoot. Bare feet are easily injured.  Check your feet daily for blisters, cuts, and redness. If you cannot see the bottom of your feet, use a mirror or ask someone for help.  Wash your feet with warm water (do not use hot water) and mild soap. Then pat your feet and the areas between your toes until they are completely dry. Do not soak your feet as this can dry your skin.  Apply a moisturizing lotion or petroleum jelly (that does not contain alcohol and is unscented) to the skin on your feet and to dry, brittle toenails. Do not apply lotion between your toes.  Trim your toenails straight across. Do not dig under them or around the cuticle. File the edges of your nails with an emery board or nail file.  Do not cut corns or calluses or try to remove them with medicine.  Wear clean socks or stockings every day. Make sure they are not too tight. Do not wear knee-high stockings since they may decrease blood flow to your legs.  Wear shoes that fit properly and have enough cushioning. To break in new shoes, wear them for just a few hours a day. This prevents you from injuring your feet. Always look in your shoes before you put them on to be sure there are no objects inside.  Do not cross your legs. This may decrease the blood flow to your feet.  If you find a minor scrape,  cut, or break in the skin on your feet, keep it and the skin around it clean and dry. These areas may be cleansed with mild soap and water. Do not cleanse the area with peroxide, alcohol, or iodine.  When you remove an adhesive bandage, be sure not to damage the skin around it.  If you have a wound, look at it several times a day to make sure it is healing.  Do not use heating pads or hot water bottles. They may burn your skin. If you have lost feeling in your feet or legs, you may not know it is happening until it is too late.  Make sure your health care provider performs a complete foot exam at least annually or more often if you have foot problems. Report any cuts, sores, or bruises to your health care provider immediately. SEEK MEDICAL CARE IF:   You have an injury that is not healing.  You have cuts or breaks in the skin.  You have an ingrown nail.  You notice redness on your legs or feet.  You feel burning or tingling in your legs or feet.  You have pain or cramps in your legs and feet.  Your legs or feet are numb.  Your feet always feel cold. SEEK IMMEDIATE MEDICAL CARE IF:   There is increasing redness,   swelling, or pain in or around a wound.  There is a red line that goes up your leg.  Pus is coming from a wound.  You develop a fever or as directed by your health care provider.  You notice a bad smell coming from an ulcer or wound.   This information is not intended to replace advice given to you by your health care provider. Make sure you discuss any questions you have with your health care provider.   Document Released: 12/27/1999 Document Revised: 08/31/2012 Document Reviewed: 06/07/2012 Elsevier Interactive Patient Education 2016 Elsevier Inc.  

## 2015-05-30 NOTE — Progress Notes (Signed)
Patient ID: Daniel Warner, male   DOB: 02-Jun-1940, 75 y.o.   MRN: [id number]  Subjective: This patient presents today complaining of thickened and elongated toenails which are uncomfortable walking wearing shoes and request nail debridement  Objective: Orientated 3 No open skin lesions bilaterally The toenails are elongated, brittle, deformed, tender to direct palpation 6-10  Assessment: Symptomatic onychomycoses 6-10 Diabetic with a history of peripheral arterial disease  Plan: Debridement toenails 6-10 mechanically and electronically without any bleeding  Reappoint 3 months

## 2015-07-24 DIAGNOSIS — I10 Essential (primary) hypertension: Secondary | ICD-10-CM | POA: Diagnosis not present

## 2015-07-24 DIAGNOSIS — Z6841 Body Mass Index (BMI) 40.0 and over, adult: Secondary | ICD-10-CM | POA: Diagnosis not present

## 2015-07-24 DIAGNOSIS — Z7984 Long term (current) use of oral hypoglycemic drugs: Secondary | ICD-10-CM | POA: Diagnosis not present

## 2015-07-24 DIAGNOSIS — I739 Peripheral vascular disease, unspecified: Secondary | ICD-10-CM | POA: Diagnosis not present

## 2015-07-24 DIAGNOSIS — E1151 Type 2 diabetes mellitus with diabetic peripheral angiopathy without gangrene: Secondary | ICD-10-CM | POA: Diagnosis not present

## 2015-07-26 ENCOUNTER — Encounter: Payer: Self-pay | Admitting: Family

## 2015-07-30 ENCOUNTER — Other Ambulatory Visit: Payer: Self-pay | Admitting: *Deleted

## 2015-07-30 DIAGNOSIS — Z48812 Encounter for surgical aftercare following surgery on the circulatory system: Secondary | ICD-10-CM

## 2015-07-30 DIAGNOSIS — I739 Peripheral vascular disease, unspecified: Secondary | ICD-10-CM

## 2015-08-01 ENCOUNTER — Encounter: Payer: Self-pay | Admitting: Family

## 2015-08-02 ENCOUNTER — Ambulatory Visit (INDEPENDENT_AMBULATORY_CARE_PROVIDER_SITE_OTHER)
Admission: RE | Admit: 2015-08-02 | Discharge: 2015-08-02 | Disposition: A | Discharge status: Home / Self Care | Payer: PPO | Source: Ambulatory Visit | Attending: Family | Admitting: Family

## 2015-08-02 ENCOUNTER — Ambulatory Visit (HOSPITAL_COMMUNITY)
Admission: RE | Admit: 2015-08-02 | Discharge: 2015-08-02 | Disposition: A | Discharge status: Home / Self Care | Payer: PPO | Source: Ambulatory Visit | Attending: Family | Admitting: Family

## 2015-08-02 ENCOUNTER — Ambulatory Visit: Payer: Commercial Managed Care - HMO | Admitting: Family

## 2015-08-02 DIAGNOSIS — N183 Chronic kidney disease, stage 3 (moderate): Secondary | ICD-10-CM | POA: Insufficient documentation

## 2015-08-02 DIAGNOSIS — K219 Gastro-esophageal reflux disease without esophagitis: Secondary | ICD-10-CM | POA: Diagnosis not present

## 2015-08-02 DIAGNOSIS — Z48812 Encounter for surgical aftercare following surgery on the circulatory system: Secondary | ICD-10-CM | POA: Diagnosis not present

## 2015-08-02 DIAGNOSIS — E7800 Pure hypercholesterolemia, unspecified: Secondary | ICD-10-CM | POA: Diagnosis not present

## 2015-08-02 DIAGNOSIS — I739 Peripheral vascular disease, unspecified: Secondary | ICD-10-CM | POA: Insufficient documentation

## 2015-08-02 DIAGNOSIS — E1122 Type 2 diabetes mellitus with diabetic chronic kidney disease: Secondary | ICD-10-CM | POA: Insufficient documentation

## 2015-08-02 DIAGNOSIS — I129 Hypertensive chronic kidney disease with stage 1 through stage 4 chronic kidney disease, or unspecified chronic kidney disease: Secondary | ICD-10-CM | POA: Insufficient documentation

## 2015-08-02 DIAGNOSIS — R0989 Other specified symptoms and signs involving the circulatory and respiratory systems: Secondary | ICD-10-CM | POA: Diagnosis not present

## 2015-08-02 LAB — VAS US LOWER EXTREMITY ARTERIAL DUPLEX
RSFDPSV: -126 cm/s
RSFMPSV: -193 cm/s
Right super femoral prox sys PSV: 147 cm/s

## 2015-08-05 ENCOUNTER — Encounter: Payer: Self-pay | Admitting: Family

## 2015-08-05 ENCOUNTER — Ambulatory Visit (INDEPENDENT_AMBULATORY_CARE_PROVIDER_SITE_OTHER): Payer: PPO | Admitting: Family

## 2015-08-05 VITALS — BP 191/95 | HR 61 | Temp 97.5°F | Resp 18 | Ht 66.5 in | Wt 260.5 lb

## 2015-08-05 DIAGNOSIS — E1151 Type 2 diabetes mellitus with diabetic peripheral angiopathy without gangrene: Secondary | ICD-10-CM | POA: Diagnosis not present

## 2015-08-05 DIAGNOSIS — I10 Essential (primary) hypertension: Secondary | ICD-10-CM | POA: Diagnosis not present

## 2015-08-05 DIAGNOSIS — I779 Disorder of arteries and arterioles, unspecified: Secondary | ICD-10-CM

## 2015-08-05 DIAGNOSIS — Z95828 Presence of other vascular implants and grafts: Secondary | ICD-10-CM | POA: Diagnosis not present

## 2015-08-05 DIAGNOSIS — R030 Elevated blood-pressure reading, without diagnosis of hypertension: Secondary | ICD-10-CM

## 2015-08-05 DIAGNOSIS — R0989 Other specified symptoms and signs involving the circulatory and respiratory systems: Secondary | ICD-10-CM

## 2015-08-05 DIAGNOSIS — IMO0001 Reserved for inherently not codable concepts without codable children: Secondary | ICD-10-CM

## 2015-08-05 DIAGNOSIS — Z7984 Long term (current) use of oral hypoglycemic drugs: Secondary | ICD-10-CM | POA: Diagnosis not present

## 2015-08-05 NOTE — Patient Instructions (Signed)
Peripheral Vascular Disease Peripheral vascular disease (PVD) is a disease of the blood vessels that are not part of your heart and brain. A simple term for PVD is poor circulation. In most cases, PVD narrows the blood vessels that carry blood from your heart to the rest of your body. This can result in a decreased supply of blood to your arms, legs, and internal organs, like your stomach or kidneys. However, it most often affects a person's lower legs and feet. There are two types of PVD.  Organic PVD. This is the more common type. It is caused by damage to the structure of blood vessels.  Functional PVD. This is caused by conditions that make blood vessels contract and tighten (spasm). Without treatment, PVD tends to get worse over time. PVD can also lead to acute ischemic limb. This is when an arm or limb suddenly has trouble getting enough blood. This is a medical emergency. CAUSES Each type of PVD has many different causes. The most common cause of PVD is buildup of a fatty material (plaque) inside of your arteries (atherosclerosis). Small amounts of plaque can break off from the walls of the blood vessels and become lodged in a smaller artery. This blocks blood flow and can cause acute ischemic limb. Other common causes of PVD include:  Blood clots that form inside of blood vessels.  Injuries to blood vessels.  Diseases that cause inflammation of blood vessels or cause blood vessel spasms.  Health behaviors and health history that increase your risk of developing PVD. RISK FACTORS  You may have a greater risk of PVD if you:  Have a family history of PVD.  Have certain medical conditions, including:  High cholesterol.  Diabetes.  High blood pressure (hypertension).  Coronary heart disease.  Past problems with blood clots.  Past injury, such as burns or a broken bone. These may have damaged blood vessels in your limbs.  Buerger disease. This is caused by inflamed blood  vessels in your hands and feet.  Some forms of arthritis.  Rare birth defects that affect the arteries in your legs.  Use tobacco.  Do not get enough exercise.  Are obese.  Are age 50 or older. SIGNS AND SYMPTOMS  PVD may cause many different symptoms. Your symptoms depend on what part of your body is not getting enough blood. Some common signs and symptoms include:  Cramps in your lower legs. This may be a symptom of poor leg circulation (claudication).  Pain and weakness in your legs while you are physically active that goes away when you rest (intermittent claudication).  Leg pain when at rest.  Leg numbness, tingling, or weakness.  Coldness in a leg or foot, especially when compared with the other leg.  Skin or hair changes. These can include:  Hair loss.  Shiny skin.  Pale or bluish skin.  Thick toenails.  Inability to get or maintain an erection (erectile dysfunction). People with PVD are more prone to developing ulcers and sores on their toes, feet, or legs. These may take longer than normal to heal. DIAGNOSIS Your health care provider may diagnose PVD from your signs and symptoms. The health care provider will also do a physical exam. You may have tests to find out what is causing your PVD and determine its severity. Tests may include:  Blood pressure recordings from your arms and legs and measurements of the strength of your pulses (pulse volume recordings).  Imaging studies using sound waves to take pictures of   the blood flow through your blood vessels (Doppler ultrasound).  Injecting a dye into your blood vessels before having imaging studies using:  X-rays (angiogram or arteriogram).  Computer-generated X-rays (CT angiogram).  A powerful electromagnetic field and a computer (magnetic resonance angiogram or MRA). TREATMENT Treatment for PVD depends on the cause of your condition and the severity of your symptoms. It also depends on your age. Underlying  causes need to be treated and controlled. These include long-lasting (chronic) conditions, such as diabetes, high cholesterol, and high blood pressure. You may need to first try making lifestyle changes and taking medicines. Surgery may be needed if these do not work. Lifestyle changes may include:  Quitting smoking.  Exercising regularly.  Following a low-fat, low-cholesterol diet. Medicines may include:  Blood thinners to prevent blood clots.  Medicines to improve blood flow.  Medicines to improve your blood cholesterol levels. Surgical procedures may include:  A procedure that uses an inflated balloon to open a blocked artery and improve blood flow (angioplasty).  A procedure to put in a tube (stent) to keep a blocked artery open (stent implant).  Surgery to reroute blood flow around a blocked artery (peripheral bypass surgery).  Surgery to remove dead tissue from an infected wound on the affected limb.  Amputation. This is surgical removal of the affected limb. This may be necessary in cases of acute ischemic limb that are not improved through medical or surgical treatments. HOME CARE INSTRUCTIONS  Take medicines only as directed by your health care provider.  Do not use any tobacco products, including cigarettes, chewing tobacco, or electronic cigarettes. If you need help quitting, ask your health care provider.  Lose weight if you are overweight, and maintain a healthy weight as directed by your health care provider.  Eat a diet that is low in fat and cholesterol. If you need help, ask your health care provider.  Exercise regularly. Ask your health care provider to suggest some good activities for you.  Use compression stockings or other mechanical devices as directed by your health care provider.  Take good care of your feet.  Wear comfortable shoes that fit well.  Check your feet often for any cuts or sores. SEEK MEDICAL CARE IF:  You have cramps in your legs  while walking.  You have leg pain when you are at rest.  You have coldness in a leg or foot.  Your skin changes.  You have erectile dysfunction.  You have cuts or sores on your feet that are not healing. SEEK IMMEDIATE MEDICAL CARE IF:  Your arm or leg turns cold and blue.  Your arms or legs become red, warm, swollen, painful, or numb.  You have chest pain or trouble breathing.  You suddenly have weakness in your face, arm, or leg.  You become very confused or lose the ability to speak.  You suddenly have a very bad headache or lose your vision.   This information is not intended to replace advice given to you by your health care provider. Make sure you discuss any questions you have with your health care provider.   Document Released: 02/06/2004 Document Revised: 01/19/2014 Document Reviewed: 06/08/2013 Elsevier Interactive Patient Education 2016 [employer]  

## 2015-08-05 NOTE — Progress Notes (Signed)
[facility] [place]   CC: Follow up peripheral artery occlusive disease  History of Present Illness Daniel Warner is a 75 y.o. male patient of Dr. Donnetta Hutching who returns today for follow-up of  right superficial femoral artery angioplasty and stenting x 2 on 10/16/14. He presented to the emergency department on 10/15/2014 with progressive rest pain in his right foot and superficial tissue loss on the toes of his right foot. He underwent aortogram with runoff  by Dr. Trula Slade and was found to have high-grade stenosis in his right superficial femoral artery. He underwent angioplasty and stenting of 2 different segments. He has had an excellent initial result. He has had complete resolution of his rest pain and he does very little walking but describes no claudication symptoms.  Pt Diabetic: Yes, 9.4  A1C in October, 2016 (review of records) Pt smoker: non-smoker  Pt meds include: Statin :Yes Betablocker: No ASA: No Other anticoagulants/antiplatelets: Plavix  Past Medical History:  Diagnosis Date  . Benign hypertension with chronic kidney disease, stage III   . Carotid artery disease ([place])   . CKD (chronic kidney disease), stage III   . Diabetes mellitus type 2 with peripheral artery disease ([place])   . Diabetes mellitus without complication ([place])   . Discolored skin    Right leg, thinks it's from a bag that has rubbed against his leg for so long. Doppler 12/2010  . GERD (gastroesophageal reflux disease)   . Hypercholesterolemia   . Hyperlipidemia   . Numbness of toes    In the morning  . Obesity   . Peripheral arterial disease [facility])     Social History Social History  Substance Use Topics  . Smoking status: Never Smoker  . Smokeless tobacco: Never Used  . Alcohol use No    Family History Family History  Problem Relation Age of Onset  . Stroke Father   . Irritable bowel syndrome Mother   . Heart disease Paternal Aunt     CABG  . Heart disease Cousin     CABG    Past Surgical History:  Procedure Laterality Date  . PERIPHERAL VASCULAR CATHETERIZATION N/A 10/16/2014   Procedure: Abdominal Aortogram w/Lower Extremity;  Surgeon: Serafina Mitchell, MD;  Location: [facility];  Service: Cardiovascular;  Laterality: N/A;  . TONSILLECTOMY    . WISDOM TOOTH EXTRACTION      No Known Allergies  Current Outpatient Prescriptions  Medication Sig Dispense Refill  . atorvastatin (LIPITOR) 10 MG tablet Take 1 tablet (10 mg total) by mouth daily. 30 tablet 1  . clopidogrel (PLAVIX) 75 MG tablet Take 1 tablet (75 mg total) by mouth daily with breakfast. 30 tablet 1  . glimepiride (AMARYL) 4 MG tablet Take 4 mg by mouth daily with breakfast.    . metFORMIN (GLUCOPHAGE) 850 MG tablet Take 850 mg by mouth every 8 (eight) hours.     Marland Kitchen omeprazole (PRILOSEC) 20 MG capsule Take 20 mg by mouth daily.    Marland Kitchen VALSARTAN PO Take by mouth daily.    Marland Kitchen oxyCODONE-acetaminophen (PERCOCET) 5-325 MG tablet Take 1 tablet by mouth every 6 (six) hours as needed for severe pain. (Patient not taking: Reported on 08/05/2015) 30 tablet 0  . oxyCODONE-acetaminophen (PERCOCET) 5-325 MG tablet Take 1 tablet by mouth every 6 (six) hours as needed for severe pain. (Patient not taking: Reported on 08/05/2015) 30 tablet 0   No current facility-administered medications for this visit.     ROS: See HPI  for pertinent positives and negatives.   Physical Examination  Vitals:   08/05/15 1151 08/05/15 1156  BP: (!) 208/90 (!) 191/95  Pulse: 61   Resp: 18   Temp: 97.5 F (36.4 C)   TempSrc: Oral   SpO2: 95%   Weight: 260 lb 8 oz (118.2 kg)   Height: 5' 6.5" (1.689 m)    Body mass index is 41.42 kg/m.  General: A&O x 3, WDWN, morbidly obese male. Gait: normal Eyes: PERRLA. Pulmonary: CTAB, without wheezes , rales or rhonchi. Cardiac: regular Rhythm, no detected murmur.         Carotid Bruits Right Left   Negative Positive  Aorta is not palpable. Radial pulses: 2+  palpable and =                           VASCULAR EXAM: Extremities without ischemic changes, without Gangrene; without open wounds.                                                                                                          LE Pulses Right Left       FEMORAL  not palpable  not palpable        POPLITEAL  not palpable   not palpable       POSTERIOR TIBIAL  not palpable   not palpable        DORSALIS PEDIS      ANTERIOR TIBIAL not palpable  not palpable    Abdomen: softly obese, NT, no palpable masses. Skin: no rashes, no ulcers. Musculoskeletal: no muscle wasting or atrophy.  Neurologic: A&O X 3; Appropriate Affect ; SENSATION: normal; MOTOR FUNCTION:  moving all extremities equally, motor strength 5/5 throughout. Speech is fluent/normal. CN 2-12 intact.    ASSESSMENT: Daniel Warner is a 75 y.o. male who is s/p right SFA stenting x 2 on 10/16/14. He has had complete resolution of his rest pain and he does very little walking but describes no claudication symptoms. He has no signs of ischemia in his feet/legs.   Today's right LE arterial duplex suggests 50-99% stenosis near the distal end of the proximal SFA stent, 50-74% mid SFA stenosis, and 50-99% distal SFA stenosis just proximal to the distal SFA stent, based on decreased visualization. Mild increase in the velocities of the right SFA compared to exam of 11/16/14. ABI's remain normal bilaterally with tri and biphasic waveforms.   Pt had not taken his valsartan for a long time, but states 2 weeks ago when he saw his PCP his blood pressure was OK. I advised pt to go to his PCP's office today and ask them to check his blood pressure. He denies headache, denies chest pain, denies dyspnea.  1.38: last creatinine result on file from October, 2016.  He has a left carotid bruit. Last carotid duplex result on file was from 2013 with mild/moderate bilateral ICA stenoses; will check on his return in 3 months. He has  no known hx of stroke or TIA.  His atherosclerotic risk factors include DM and obesity. Fortunately he has never used tobacco. He takes a statin and Plavix.   PLAN:  Based on the patient's vascular studies and examination, pt will return to clinic in 3 months with right LE arterial duplex, ABI's, and carotid duplex. See me on a day that Dr. Donnetta Hutching is in the office.   I discussed in depth with the patient the nature of atherosclerosis, and emphasized the importance of maximal medical management including strict control of blood pressure, blood glucose, and lipid levels, obtaining regular exercise, and continued cessation of smoking.  The patient is aware that without maximal medical management the underlying atherosclerotic disease process will progress, limiting the benefit of any interventions.  The patient was given information about PAD including signs, symptoms, treatment, what symptoms should prompt the patient to seek immediate medical care, and risk reduction measures to take.  Clemon Chambers, RN, MSN, FNP-C [facility] Phone: [phone]  Clinic MD: Trula Slade  08/05/15 12:13 PM

## 2015-08-12 ENCOUNTER — Other Ambulatory Visit: Payer: Self-pay | Admitting: *Deleted

## 2015-08-12 DIAGNOSIS — I739 Peripheral vascular disease, unspecified: Secondary | ICD-10-CM

## 2015-08-12 DIAGNOSIS — I6523 Occlusion and stenosis of bilateral carotid arteries: Secondary | ICD-10-CM

## 2015-09-04 ENCOUNTER — Encounter: Payer: Self-pay | Admitting: Podiatry

## 2015-09-04 ENCOUNTER — Ambulatory Visit (INDEPENDENT_AMBULATORY_CARE_PROVIDER_SITE_OTHER): Payer: PPO | Admitting: Podiatry

## 2015-09-04 DIAGNOSIS — M79676 Pain in unspecified toe(s): Secondary | ICD-10-CM

## 2015-09-04 DIAGNOSIS — B351 Tinea unguium: Secondary | ICD-10-CM | POA: Diagnosis not present

## 2015-09-04 NOTE — Patient Instructions (Signed)
Diabetes and Foot Care Diabetes may cause you to have problems because of poor blood supply (circulation) to your feet and legs. This may cause the skin on your feet to become thinner, break easier, and heal more slowly. Your skin may become dry, and the skin may peel and crack. You may also have nerve damage in your legs and feet causing decreased feeling in them. You may not notice minor injuries to your feet that could lead to infections or more serious problems. Taking care of your feet is one of the most important things you can do for yourself.  HOME CARE INSTRUCTIONS  Wear shoes at all times, even in the house. Do not go barefoot. Bare feet are easily injured.  Check your feet daily for blisters, cuts, and redness. If you cannot see the bottom of your feet, use a mirror or ask someone for help.  Wash your feet with warm water (do not use hot water) and mild soap. Then pat your feet and the areas between your toes until they are completely dry. Do not soak your feet as this can dry your skin.  Apply a moisturizing lotion or petroleum jelly (that does not contain alcohol and is unscented) to the skin on your feet and to dry, brittle toenails. Do not apply lotion between your toes.  Trim your toenails straight across. Do not dig under them or around the cuticle. File the edges of your nails with an emery board or nail file.  Do not cut corns or calluses or try to remove them with medicine.  Wear clean socks or stockings every day. Make sure they are not too tight. Do not wear knee-high stockings since they may decrease blood flow to your legs.  Wear shoes that fit properly and have enough cushioning. To break in new shoes, wear them for just a few hours a day. This prevents you from injuring your feet. Always look in your shoes before you put them on to be sure there are no objects inside.  Do not cross your legs. This may decrease the blood flow to your feet.  If you find a minor scrape,  cut, or break in the skin on your feet, keep it and the skin around it clean and dry. These areas may be cleansed with mild soap and water. Do not cleanse the area with peroxide, alcohol, or iodine.  When you remove an adhesive bandage, be sure not to damage the skin around it.  If you have a wound, look at it several times a day to make sure it is healing.  Do not use heating pads or hot water bottles. They may burn your skin. If you have lost feeling in your feet or legs, you may not know it is happening until it is too late.  Make sure your health care provider performs a complete foot exam at least annually or more often if you have foot problems. Report any cuts, sores, or bruises to your health care provider immediately. SEEK MEDICAL CARE IF:   You have an injury that is not healing.  You have cuts or breaks in the skin.  You have an ingrown nail.  You notice redness on your legs or feet.  You feel burning or tingling in your legs or feet.  You have pain or cramps in your legs and feet.  Your legs or feet are numb.  Your feet always feel cold. SEEK IMMEDIATE MEDICAL CARE IF:   There is increasing redness,   swelling, or pain in or around a wound.  There is a red line that goes up your leg.  Pus is coming from a wound.  You develop a fever or as directed by your health care provider.  You notice a bad smell coming from an ulcer or wound.   This information is not intended to replace advice given to you by your health care provider. Make sure you discuss any questions you have with your health care provider.   Document Released: 12/27/1999 Document Revised: 08/31/2012 Document Reviewed: 06/07/2012 Elsevier Interactive Patient Education 2016 Elsevier Inc.  

## 2015-09-05 NOTE — Progress Notes (Signed)
Patient ID: Daniel Warner, male   DOB: 10-20-1940, 75 y.o.   MRN: [id number]    Subjective: This patient presents today complaining of thickened and elongated toenails which are uncomfortable walking wearing shoes and request nail debridement  Objective: Orientated 3 No open skin lesions bilaterally Pitting edema ankles bilaterally DP and PT pulses 2/4 bilaterally Capillary reflex immediate bilaterally Sensation to 10 g monofilament wire intact 4/5 right and 3/5 left Ankle reflexes reactive bilaterally Vibratory sensation nonreactive bilaterally The toenails are elongated, brittle, deformed, tender to direct palpation 6-10  Assessment: Symptomatic onychomycoses 6-10 Diabetic with a history of peripheral arterial disease Diabetic with peripheral neuropathy  Plan: Debridement toenails 6-10 mechanically and electronically without any bleeding  Reappoint 3 months

## 2015-10-08 DIAGNOSIS — H401122 Primary open-angle glaucoma, left eye, moderate stage: Secondary | ICD-10-CM | POA: Diagnosis not present

## 2015-10-08 DIAGNOSIS — H401112 Primary open-angle glaucoma, right eye, moderate stage: Secondary | ICD-10-CM | POA: Diagnosis not present

## 2015-10-08 DIAGNOSIS — H25013 Cortical age-related cataract, bilateral: Secondary | ICD-10-CM | POA: Diagnosis not present

## 2015-10-08 DIAGNOSIS — E119 Type 2 diabetes mellitus without complications: Secondary | ICD-10-CM | POA: Diagnosis not present

## 2015-10-17 DIAGNOSIS — Z23 Encounter for immunization: Secondary | ICD-10-CM | POA: Diagnosis not present

## 2015-11-07 ENCOUNTER — Encounter: Payer: Self-pay | Admitting: Family

## 2015-11-11 ENCOUNTER — Ambulatory Visit: Payer: PPO | Admitting: Family

## 2015-11-11 ENCOUNTER — Encounter (HOSPITAL_COMMUNITY): Payer: PPO

## 2015-11-11 ENCOUNTER — Inpatient Hospital Stay (HOSPITAL_COMMUNITY): Admission: RE | Admit: 2015-11-11 | Payer: PPO | Source: Ambulatory Visit

## 2015-11-11 ENCOUNTER — Other Ambulatory Visit (HOSPITAL_COMMUNITY): Payer: PPO

## 2015-12-04 ENCOUNTER — Ambulatory Visit (INDEPENDENT_AMBULATORY_CARE_PROVIDER_SITE_OTHER): Payer: PPO | Admitting: Podiatry

## 2015-12-04 ENCOUNTER — Encounter: Payer: Self-pay | Admitting: Podiatry

## 2015-12-04 VITALS — BP 159/72 | HR 81 | Resp 18

## 2015-12-04 DIAGNOSIS — M79676 Pain in unspecified toe(s): Secondary | ICD-10-CM | POA: Diagnosis not present

## 2015-12-04 DIAGNOSIS — B351 Tinea unguium: Secondary | ICD-10-CM

## 2015-12-04 NOTE — Patient Instructions (Signed)
There is a small amount of bleeding at the end of the right great toe which was treated with antibiotic ointment and Band-Aid. Remove the Band-Aid and 1-3 days and apply topical antibiotic ointment and Band-Aid daily until a scab forms.  Diabetes and Foot Care Diabetes may cause you to have problems because of poor blood supply (circulation) to your feet and legs. This may cause the skin on your feet to become thinner, break easier, and heal more slowly. Your skin may become dry, and the skin may peel and crack. You may also have nerve damage in your legs and feet causing decreased feeling in them. You may not notice minor injuries to your feet that could lead to infections or more serious problems. Taking care of your feet is one of the most important things you can do for yourself. Follow these instructions at home:  Wear shoes at all times, even in the house. Do not go barefoot. Bare feet are easily injured.  Check your feet daily for blisters, cuts, and redness. If you cannot see the bottom of your feet, use a mirror or ask someone for help.  Wash your feet with warm water (do not use hot water) and mild soap. Then pat your feet and the areas between your toes until they are completely dry. Do not soak your feet as this can dry your skin.  Apply a moisturizing lotion or petroleum jelly (that does not contain alcohol and is unscented) to the skin on your feet and to dry, brittle toenails. Do not apply lotion between your toes.  Trim your toenails straight across. Do not dig under them or around the cuticle. File the edges of your nails with an emery board or nail file.  Do not cut corns or calluses or try to remove them with medicine.  Wear clean socks or stockings every day. Make sure they are not too tight. Do not wear knee-high stockings since they may decrease blood flow to your legs.  Wear shoes that fit properly and have enough cushioning. To break in new shoes, wear them for just a few  hours a day. This prevents you from injuring your feet. Always look in your shoes before you put them on to be sure there are no objects inside.  Do not cross your legs. This may decrease the blood flow to your feet.  If you find a minor scrape, cut, or break in the skin on your feet, keep it and the skin around it clean and dry. These areas may be cleansed with mild soap and water. Do not cleanse the area with peroxide, alcohol, or iodine.  When you remove an adhesive bandage, be sure not to damage the skin around it.  If you have a wound, look at it several times a day to make sure it is healing.  Do not use heating pads or hot water bottles. They may burn your skin. If you have lost feeling in your feet or legs, you may not know it is happening until it is too late.  Make sure your health care provider performs a complete foot exam at least annually or more often if you have foot problems. Report any cuts, sores, or bruises to your health care provider immediately. Contact a health care provider if:  You have an injury that is not healing.  You have cuts or breaks in the skin.  You have an ingrown nail.  You notice redness on your legs or feet.  You  feel burning or tingling in your legs or feet.  You have pain or cramps in your legs and feet.  Your legs or feet are numb.  Your feet always feel cold. Get help right away if:  There is increasing redness, swelling, or pain in or around a wound.  There is a red line that goes up your leg.  Pus is coming from a wound.  You develop a fever or as directed by your health care provider.  You notice a bad smell coming from an ulcer or wound. This information is not intended to replace advice given to you by your health care provider. Make sure you discuss any questions you have with your health care provider. Document Released: 12/27/1999 Document Revised: 06/06/2015 Document Reviewed: 06/07/2012 Elsevier Interactive Patient  Education  2017 [employer].

## 2015-12-05 NOTE — Progress Notes (Signed)
Patient ID: Daniel Warner, male   DOB: [birth year], 75 y.o.   MRN: [id number]   Subjective: This patient presents today complaining of thickened and elongated toenails which are uncomfortable walking wearing shoes and request nail debridement  Objective: Orientated 3 No open skin lesions bilaterally Pitting edema ankles bilaterally DP and PT pulses 2/4 bilaterally Capillary reflex immediate bilaterally Sensation to 10 g monofilament wire intact 4/5 right and 3/5 left Ankle reflexes reactive bilaterally Vibratory sensation nonreactive bilaterally The toenails are elongated, brittle, deformed, tender to direct palpation 6-10  Assessment: Symptomatic onychomycoses 6-10 Diabetic with a history of peripheral arterial disease Diabetic with peripheral neuropathy  Plan: Debridement toenails 6-10 mechanically and electronically with with slight bleeding distal first right toe treated with topical antibiotic ointment. Patient instructed removed Band-Aid 1-3 days and continue apply topical antibiotic ointment and a Band-Aid until a scab forms  Reappoint 3 months

## 2015-12-16 DIAGNOSIS — J209 Acute bronchitis, unspecified: Secondary | ICD-10-CM | POA: Diagnosis not present

## 2015-12-16 DIAGNOSIS — J069 Acute upper respiratory infection, unspecified: Secondary | ICD-10-CM | POA: Diagnosis not present

## 2016-01-20 ENCOUNTER — Encounter: Payer: Self-pay | Admitting: Family

## 2016-01-24 ENCOUNTER — Ambulatory Visit (INDEPENDENT_AMBULATORY_CARE_PROVIDER_SITE_OTHER): Payer: Medicare HMO | Admitting: Family

## 2016-01-24 ENCOUNTER — Ambulatory Visit (INDEPENDENT_AMBULATORY_CARE_PROVIDER_SITE_OTHER)
Admission: RE | Admit: 2016-01-24 | Discharge: 2016-01-24 | Disposition: A | Discharge status: Home / Self Care | Payer: Medicare HMO | Source: Ambulatory Visit | Attending: Family | Admitting: Family

## 2016-01-24 ENCOUNTER — Ambulatory Visit (HOSPITAL_COMMUNITY)
Admission: RE | Admit: 2016-01-24 | Discharge: 2016-01-24 | Disposition: A | Discharge status: Home / Self Care | Payer: Medicare HMO | Source: Ambulatory Visit | Attending: Family | Admitting: Family

## 2016-01-24 ENCOUNTER — Encounter: Payer: Self-pay | Admitting: Family

## 2016-01-24 VITALS — BP 162/86 | HR 62 | Temp 97.0°F | Resp 20 | Ht 66.5 in | Wt 254.0 lb

## 2016-01-24 DIAGNOSIS — I739 Peripheral vascular disease, unspecified: Secondary | ICD-10-CM | POA: Insufficient documentation

## 2016-01-24 DIAGNOSIS — R9439 Abnormal result of other cardiovascular function study: Secondary | ICD-10-CM | POA: Insufficient documentation

## 2016-01-24 DIAGNOSIS — I779 Disorder of arteries and arterioles, unspecified: Secondary | ICD-10-CM | POA: Diagnosis not present

## 2016-01-24 DIAGNOSIS — I6523 Occlusion and stenosis of bilateral carotid arteries: Secondary | ICD-10-CM | POA: Insufficient documentation

## 2016-01-24 DIAGNOSIS — Z95828 Presence of other vascular implants and grafts: Secondary | ICD-10-CM | POA: Diagnosis not present

## 2016-01-24 DIAGNOSIS — I743 Embolism and thrombosis of arteries of the lower extremities: Secondary | ICD-10-CM | POA: Diagnosis not present

## 2016-01-24 DIAGNOSIS — Z95820 Peripheral vascular angioplasty status with implants and grafts: Secondary | ICD-10-CM | POA: Diagnosis not present

## 2016-01-24 DIAGNOSIS — R0989 Other specified symptoms and signs involving the circulatory and respiratory systems: Secondary | ICD-10-CM

## 2016-01-24 LAB — VAS US CAROTID
LCCADSYS: 108 cm/s
LCCAPSYS: 123 cm/s
LEFT ECA DIAS: -13 cm/s
LICAPDIAS: -22 cm/s
Left CCA dist dias: 18 cm/s
Left CCA prox dias: 21 cm/s
Left ICA dist dias: -18 cm/s
Left ICA dist sys: -57 cm/s
Left ICA prox sys: -106 cm/s
RCCADSYS: -61 cm/s
RIGHT CCA MID DIAS: 21 cm/s
RIGHT ECA DIAS: -18 cm/s
Right CCA prox dias: 18 cm/s
Right CCA prox sys: 142 cm/s

## 2016-01-24 NOTE — Progress Notes (Signed)
[facility] [place]   CC: Follow up peripheral artery occlusive disease  History of Present Illness Daniel Warner is a 76 y.o. male patient of Dr. Donnetta Hutching who returns today for follow-up of  right superficial femoral artery angioplasty and stenting x 2 on 10/16/14. He presented to the emergency department on 10/15/2014 with progressive rest pain in his right foot and superficial tissue loss on the toes of his right foot. He underwent aortogram with runoff  by Dr. Trula Slade and was found to have high-grade stenosis in his right superficial femoral artery. He underwent angioplasty and stenting of 2 different segments. He has had an excellent initial result. He has had complete resolution of his rest pain and he does very little walking but describes no claudication symptoms.  Pt Diabetic: Yes, 9.4  A1C in October, 2016 (review of records) Pt smoker: non-smoker  Pt meds include: Statin :Yes Betablocker: No ASA: No Other anticoagulants/antiplatelets: Plavix    Past Medical History:  Diagnosis Date  . Benign hypertension with chronic kidney disease, stage III   . Carotid artery disease ([place])   . CKD (chronic kidney disease), stage III   . Diabetes mellitus type 2 with peripheral artery disease ([place])   . Diabetes mellitus without complication ([place])   . Discolored skin    Right leg, thinks it's from a bag that has rubbed against his leg for so long. Doppler 12/2010  . GERD (gastroesophageal reflux disease)   . Hypercholesterolemia   . Hyperlipidemia   . Numbness of toes    In the morning  . Obesity   . Peripheral arterial disease [facility]     Social History Social History  Substance Use Topics  . Smoking status: Never Smoker  . Smokeless tobacco: Never Used  . Alcohol use No    Family History Family History  Problem Relation Age of Onset  . Stroke Father   . Irritable bowel syndrome Mother   . Heart disease Paternal Aunt     CABG  . Heart disease  Cousin     CABG    Past Surgical History:  Procedure Laterality Date  . PERIPHERAL VASCULAR CATHETERIZATION N/A 10/16/2014   Procedure: Abdominal Aortogram w/Lower Extremity;  Surgeon: Serafina Mitchell, MD;  Location: [place] [facility];  Service: Cardiovascular;  Laterality: N/A;  . TONSILLECTOMY    . WISDOM TOOTH EXTRACTION      No Known Allergies  Current Outpatient Prescriptions  Medication Sig Dispense Refill  . amLODipine (NORVASC) 2.5 MG tablet   1  . atorvastatin (LIPITOR) 10 MG tablet Take 1 tablet (10 mg total) by mouth daily. 30 tablet 1  . clopidogrel (PLAVIX) 75 MG tablet Take 1 tablet (75 mg total) by mouth daily with breakfast. 30 tablet 1  . glimepiride (AMARYL) 4 MG tablet Take 4 mg by mouth daily with breakfast.    . metFORMIN (GLUCOPHAGE) 850 MG tablet Take 850 mg by mouth every 8 (eight) hours.     Marland Kitchen omeprazole (PRILOSEC) 20 MG capsule Take 20 mg by mouth daily.    . valsartan (DIOVAN) 80 MG tablet   0   No current facility-administered medications for this visit.     ROS: See HPI for pertinent positives and negatives.   Physical Examination  Vitals:   01/24/16 1457 01/24/16 1503  BP: (!) 200/95 (!) 162/86  Pulse: 68 62  Resp: 20   Temp: 97 F (36.1 C)   TempSrc: Oral   SpO2: 98%  Weight: 254 lb (115.2 kg)   Height: 5' 6.5" (1.689 m)    Body mass index is 40.38 kg/m.  General: A&O x 3, WDWN, morbidly obese male. Gait: normal Eyes: PERRLA. Pulmonary: Respirations are non labored, CTAB, without wheezes , rales or rhonchi. Cardiac: regular rhythm, no detected murmur.         Carotid Bruits Right Left   Negative Positive  Aorta is not palpable. Radial pulses: 2+ palpable and =                           VASCULAR EXAM: Extremities without ischemic changes, without Gangrene; without open wounds.                                                                                                                                                        LE Pulses Right Left       FEMORAL  not palpable  not palpable       POPLITEAL  not palpable  not palpable       POSTERIOR TIBIAL  not palpable  not palpable       DORSALIS PEDIS      ANTERIOR TIBIAL not palpable not palpable   Abdomen: softly obese, NT, no palpable masses. Skin: no rashes, no ulcers. Musculoskeletal: no muscle wasting or atrophy.         Neurologic: A&O X 3; Appropriate Affect ; SENSATION: normal; MOTOR FUNCTION:  moving all extremities equally, motor strength 5/5 throughout. Speech is fluent/normal. CN 2-12 intact.    ASSESSMENT: Daniel Warner is a 76 y.o. male who is s/p right SFA stenting x 2 on 10/16/14. He has had complete resolution of his rest pain and he does very little walking but describes no claudication symptoms. He has no signs of ischemia in his feet/legs.  I advised pt see his PCP's re his uncontrolled blood pressure. He denies headache, denies chest pain, denies dyspnea.  He has a left carotid bruit.  Last carotid duplex result on file prior to today was from 2013 with mild/moderate bilateral ICA stenoses. He has no known hx of stroke or TIA.   His atherosclerotic risk factors include DM and obesity. Fortunately he has never used tobacco. He takes a statin and Plavix.   DATA   Today's carotid duplex suggests <40% bilateral ICA stenosis.  Bilateral vertebral artery flow is antegrade.  Bilateral subclavian artery waveforms are normal.  No significant change from 2013 exam.   Today's right LE arterial duplex suggests 50-99% stenosis near the distal end of the proximal SFA stent, 50-74% mid SFA stenosis, and 50-99% mid SFA stenosis, and 50-99% stenosis in the distal SFA just proximal to the distal SFA stent, based on decreased visualization. No significant change compared to the last exam on  08-02-15.   ABI's remain normal bilaterally with what appears to be tri and biphasic waveforms.Left TBI is normal. Right: 0.96 (0.97 on 08-02-15),  TBI: 0.62 Left: 1.03 (1.0), TBI: 0.76    PLAN:  Graduated walking program discussed and how to achieve.   Based on the patient's vascular studies and examination, pt will return to clinic in 6 months with right LE arterial duplex, ABI's; carotid duplex in 3 years.  I discussed in depth with the patient the nature of atherosclerosis, and emphasized the importance of maximal medical management including strict control of blood pressure, blood glucose, and lipid levels, obtaining regular exercise, and continued cessation of smoking.  The patient is aware that without maximal medical management the underlying atherosclerotic disease process will progress, limiting the benefit of any interventions.  The patient was given information about PAD including signs, symptoms, treatment, what symptoms should prompt the patient to seek immediate medical care, and risk reduction measures to take.  Clemon Chambers, RN, MSN, FNP-C [facility] [employer] Phone: [phone]  Clinic MD: Trula Slade  01/24/16 3:35 PM

## 2016-01-24 NOTE — Patient Instructions (Signed)

## 2016-01-28 NOTE — Addendum Note (Signed)
Addended by: Lianne Cure A on: 01/28/2016 03:39 PM   Modules accepted: Orders

## 2016-03-04 ENCOUNTER — Ambulatory Visit (INDEPENDENT_AMBULATORY_CARE_PROVIDER_SITE_OTHER): Payer: Medicare HMO | Admitting: Podiatry

## 2016-03-04 ENCOUNTER — Encounter: Payer: Self-pay | Admitting: Podiatry

## 2016-03-04 VITALS — BP 162/71 | HR 75 | Resp 18

## 2016-03-04 DIAGNOSIS — B351 Tinea unguium: Secondary | ICD-10-CM | POA: Diagnosis not present

## 2016-03-04 DIAGNOSIS — E1142 Type 2 diabetes mellitus with diabetic polyneuropathy: Secondary | ICD-10-CM | POA: Diagnosis not present

## 2016-03-04 DIAGNOSIS — M79676 Pain in unspecified toe(s): Secondary | ICD-10-CM | POA: Diagnosis not present

## 2016-03-04 NOTE — Patient Instructions (Signed)

## 2016-03-04 NOTE — Progress Notes (Signed)
Patient ID: Daniel Warner, male   DOB: 1940-02-18, 76 y.o.   MRN: [id number]    Subjective: This patient presents today complaining of thickened and elongated toenails which are uncomfortable walking wearing shoes and request nail debridement  Objective: Orientated 3 No open skin lesions bilaterally Pitting edema ankles bilaterally DP and PT pulses 2/4 bilaterally Capillary reflex immediate bilaterally Sensation to 10 g monofilament wire intact 4/5 right and 3/5 left Ankle reflexes reactive bilaterally Vibratory sensation nonreactive bilaterally The toenails are elongated, brittle, deformed, tender to direct palpation 6-10 Pes planus bilaterally Manual motor testing dorsi flexion, plantar flexion 5/5 bilaterally  Assessment: Symptomatic onychomycoses 6-10 Diabetic with a history of peripheral arterial disease Diabetic with peripheral neuropathy  Plan: Debridement toenails 6-10 mechanically and electronically with with slight bleeding distal first right toe treated with topical antibiotic ointment. Patient instructed removed Band-Aid 1-3 days and continue apply topical antibiotic ointment and a Band-Aid until a scab forms  Reappoint 3 months

## 2016-04-14 DIAGNOSIS — H401132 Primary open-angle glaucoma, bilateral, moderate stage: Secondary | ICD-10-CM | POA: Diagnosis not present

## 2016-05-12 DIAGNOSIS — Z0000 Encounter for general adult medical examination without abnormal findings: Secondary | ICD-10-CM | POA: Diagnosis not present

## 2016-05-12 DIAGNOSIS — E1151 Type 2 diabetes mellitus with diabetic peripheral angiopathy without gangrene: Secondary | ICD-10-CM | POA: Diagnosis not present

## 2016-05-12 DIAGNOSIS — Z1389 Encounter for screening for other disorder: Secondary | ICD-10-CM | POA: Diagnosis not present

## 2016-05-12 DIAGNOSIS — Z79899 Other long term (current) drug therapy: Secondary | ICD-10-CM | POA: Diagnosis not present

## 2016-05-12 DIAGNOSIS — E7800 Pure hypercholesterolemia, unspecified: Secondary | ICD-10-CM | POA: Diagnosis not present

## 2016-05-12 DIAGNOSIS — G4733 Obstructive sleep apnea (adult) (pediatric): Secondary | ICD-10-CM | POA: Diagnosis not present

## 2016-05-12 DIAGNOSIS — I10 Essential (primary) hypertension: Secondary | ICD-10-CM | POA: Diagnosis not present

## 2016-05-12 DIAGNOSIS — Z6841 Body Mass Index (BMI) 40.0 and over, adult: Secondary | ICD-10-CM | POA: Diagnosis not present

## 2016-05-12 DIAGNOSIS — Z7984 Long term (current) use of oral hypoglycemic drugs: Secondary | ICD-10-CM | POA: Diagnosis not present

## 2016-06-03 ENCOUNTER — Ambulatory Visit (INDEPENDENT_AMBULATORY_CARE_PROVIDER_SITE_OTHER): Payer: Medicare HMO | Admitting: Podiatry

## 2016-06-03 ENCOUNTER — Encounter: Payer: Self-pay | Admitting: Podiatry

## 2016-06-03 DIAGNOSIS — E1142 Type 2 diabetes mellitus with diabetic polyneuropathy: Secondary | ICD-10-CM

## 2016-06-03 DIAGNOSIS — M79676 Pain in unspecified toe(s): Secondary | ICD-10-CM | POA: Diagnosis not present

## 2016-06-03 DIAGNOSIS — B351 Tinea unguium: Secondary | ICD-10-CM | POA: Diagnosis not present

## 2016-06-03 NOTE — Progress Notes (Signed)
Patient ID: ISAIR INABINET, male   DOB: May 08, 1940, 76 y.o.   MRN: [id number]    Subjective: This patient presents today complaining of thickened and elongated toenails which are uncomfortable walking wearing shoes and request nail debridement  Objective: Orientated 3 No open skin lesions bilaterally Pitting edema ankles bilaterally DP and PT pulses 2/4 bilaterally Capillary reflex immediate bilaterally Sensation to 10 g monofilament wire intact 4/5 right and 3/5 left Ankle reflexes reactive bilaterally Vibratory sensation nonreactive bilaterally The toenails are elongated, brittle, deformed, tender to direct palpation 6-10 Absent hair growth and atrophic skin bilaterally Pes planus bilaterally Manual motor testing dorsi flexion, plantar flexion 5/5 bilaterally  Assessment: Symptomatic onychomycoses 6-10 Diabetic with a history of peripheral arterial disease Diabetic with peripheral neuropathy  Plan: Debridement toenails 6-10 mechanically and electronically withwith slight bleeding distal first leftt toe treated with topical antibiotic ointment. Patient instructed removed Band-Aid 1-3 days and continue apply topical antibiotic ointment and a Band-Aid until a scab forms  Reappoint 3 months

## 2016-06-03 NOTE — Patient Instructions (Signed)
Remove the Band-Aid on the left great toe and 1-3 days and continue to apply topical antibiotic ointment and Band-Aid daily until a scab forms  Diabetes and Foot Care Diabetes may cause you to have problems because of poor blood supply (circulation) to your feet and legs. This may cause the skin on your feet to become thinner, break easier, and heal more slowly. Your skin may become dry, and the skin may peel and crack. You may also have nerve damage in your legs and feet causing decreased feeling in them. You may not notice minor injuries to your feet that could lead to infections or more serious problems. Taking care of your feet is one of the most important things you can do for yourself. Follow these instructions at home:  Wear shoes at all times, even in the house. Do not go barefoot. Bare feet are easily injured.  Check your feet daily for blisters, cuts, and redness. If you cannot see the bottom of your feet, use a mirror or ask someone for help.  Wash your feet with warm water (do not use hot water) and mild soap. Then pat your feet and the areas between your toes until they are completely dry. Do not soak your feet as this can dry your skin.  Apply a moisturizing lotion or petroleum jelly (that does not contain alcohol and is unscented) to the skin on your feet and to dry, brittle toenails. Do not apply lotion between your toes.  Trim your toenails straight across. Do not dig under them or around the cuticle. File the edges of your nails with an emery board or nail file.  Do not cut corns or calluses or try to remove them with medicine.  Wear clean socks or stockings every day. Make sure they are not too tight. Do not wear knee-high stockings since they may decrease blood flow to your legs.  Wear shoes that fit properly and have enough cushioning. To break in new shoes, wear them for just a few hours a day. This prevents you from injuring your feet. Always look in your shoes before you  put them on to be sure there are no objects inside.  Do not cross your legs. This may decrease the blood flow to your feet.  If you find a minor scrape, cut, or break in the skin on your feet, keep it and the skin around it clean and dry. These areas may be cleansed with mild soap and water. Do not cleanse the area with peroxide, alcohol, or iodine.  When you remove an adhesive bandage, be sure not to damage the skin around it.  If you have a wound, look at it several times a day to make sure it is healing.  Do not use heating pads or hot water bottles. They may burn your skin. If you have lost feeling in your feet or legs, you may not know it is happening until it is too late.  Make sure your health care provider performs a complete foot exam at least annually or more often if you have foot problems. Report any cuts, sores, or bruises to your health care provider immediately. Contact a health care provider if:  You have an injury that is not healing.  You have cuts or breaks in the skin.  You have an ingrown nail.  You notice redness on your legs or feet.  You feel burning or tingling in your legs or feet.  You have pain or cramps in  your legs and feet.  Your legs or feet are numb.  Your feet always feel cold. Get help right away if:  There is increasing redness, swelling, or pain in or around a wound.  There is a red line that goes up your leg.  Pus is coming from a wound.  You develop a fever or as directed by your health care provider.  You notice a bad smell coming from an ulcer or wound. This information is not intended to replace advice given to you by your health care provider. Make sure you discuss any questions you have with your health care provider. Document Released: 12/27/1999 Document Revised: 06/06/2015 Document Reviewed: 06/07/2012 Elsevier Interactive Patient Education  2017 [employer].

## 2016-06-15 DIAGNOSIS — Z79899 Other long term (current) drug therapy: Secondary | ICD-10-CM | POA: Diagnosis not present

## 2016-07-13 ENCOUNTER — Encounter: Payer: Self-pay | Admitting: Family

## 2016-07-20 DIAGNOSIS — Z79899 Other long term (current) drug therapy: Secondary | ICD-10-CM | POA: Diagnosis not present

## 2016-07-24 ENCOUNTER — Ambulatory Visit (INDEPENDENT_AMBULATORY_CARE_PROVIDER_SITE_OTHER)
Admission: RE | Admit: 2016-07-24 | Discharge: 2016-07-24 | Disposition: A | Discharge status: Home / Self Care | Payer: Medicare HMO | Source: Ambulatory Visit | Attending: Vascular Surgery | Admitting: Vascular Surgery

## 2016-07-24 ENCOUNTER — Ambulatory Visit (INDEPENDENT_AMBULATORY_CARE_PROVIDER_SITE_OTHER): Payer: Medicare HMO | Admitting: Family

## 2016-07-24 ENCOUNTER — Encounter: Payer: Self-pay | Admitting: Family

## 2016-07-24 ENCOUNTER — Ambulatory Visit (HOSPITAL_COMMUNITY)
Admission: RE | Admit: 2016-07-24 | Discharge: 2016-07-24 | Disposition: A | Discharge status: Home / Self Care | Payer: Medicare HMO | Source: Ambulatory Visit | Attending: Vascular Surgery | Admitting: Vascular Surgery

## 2016-07-24 VITALS — BP 190/90 | HR 63 | Temp 98.6°F | Resp 20 | Ht 66.5 in | Wt 256.0 lb

## 2016-07-24 DIAGNOSIS — R0989 Other specified symptoms and signs involving the circulatory and respiratory systems: Secondary | ICD-10-CM | POA: Diagnosis not present

## 2016-07-24 DIAGNOSIS — Z95828 Presence of other vascular implants and grafts: Secondary | ICD-10-CM | POA: Insufficient documentation

## 2016-07-24 DIAGNOSIS — T82856A Stenosis of peripheral vascular stent, initial encounter: Secondary | ICD-10-CM | POA: Diagnosis not present

## 2016-07-24 DIAGNOSIS — Z95820 Peripheral vascular angioplasty status with implants and grafts: Secondary | ICD-10-CM | POA: Diagnosis not present

## 2016-07-24 DIAGNOSIS — Y838 Other surgical procedures as the cause of abnormal reaction of the patient, or of later complication, without mention of misadventure at the time of the procedure: Secondary | ICD-10-CM | POA: Insufficient documentation

## 2016-07-24 DIAGNOSIS — I779 Disorder of arteries and arterioles, unspecified: Secondary | ICD-10-CM | POA: Diagnosis not present

## 2016-07-24 NOTE — Patient Instructions (Signed)

## 2016-07-24 NOTE — Progress Notes (Signed)
[facility] [place]   CC: Follow up peripheral artery occlusive disease  History of Present Illness Daniel Warner is a 76 y.o. male patient of Dr. Arbie Cookey who returns today for follow-up of right superficial femoral artery angioplasty and stenting x 2 on 10/16/14. He presented to the emergency department on 10/15/2014 with progressive rest pain in his right foot and superficial tissue loss on the toes of his right foot. He underwent aortogram with runoff by Dr. Harvest Dark found to have high-grade stenosis in his right superficial femoral artery. He underwent angioplasty and stenting of 2 different segments. He has had an excellent initial result. He has had complete resolution of his rest pain and he does very little walking but describes no claudication symptoms.  After walking about a block both calves feel tired, immediately relieved by rest.   Pt states his blood pressure in Dr. Laverle Hobby office is about 130 systolic, is elevated now.   Pt Diabetic: Yes, 9.4 A1C in October, 2016 (review of records), wife states his blood sugar has improved.  Pt smoker: non-smoker  Pt meds include: Statin :Yes Betablocker: No ASA: No Other anticoagulants/antiplatelets: Plavix   Past Medical History:  Diagnosis Date  . Benign hypertension with chronic kidney disease, stage III   . Carotid artery disease ([facility])   . CKD (chronic kidney disease), stage III   . Diabetes mellitus type 2 with peripheral artery disease ([facility])   . Diabetes mellitus without complication ([facility])   . Discolored skin    Right leg, thinks it's from a bag that has rubbed against his leg for so long. Doppler 12/2010  . GERD (gastroesophageal reflux disease)   . Hypercholesterolemia   . Hyperlipidemia   . Numbness of toes    In the morning  . Obesity   . Peripheral arterial disease [facility])     Social History Social History  Substance Use Topics  . Smoking status: Never Smoker  . Smokeless  tobacco: Never Used  . Alcohol use No    Family History Family History  Problem Relation Age of Onset  . Stroke Father   . Irritable bowel syndrome Mother   . Heart disease Paternal Aunt        CABG  . Heart disease Cousin        CABG    Past Surgical History:  Procedure Laterality Date  . PERIPHERAL VASCULAR CATHETERIZATION N/A 10/16/2014   Procedure: Abdominal Aortogram w/Lower Extremity;  Surgeon: Nada Libman, MD;  Location: [facility];  Service: Cardiovascular;  Laterality: N/A;  . TONSILLECTOMY    . WISDOM TOOTH EXTRACTION      No Known Allergies  Current Outpatient Prescriptions  Medication Sig Dispense Refill  . amLODipine (NORVASC) 2.5 MG tablet   1  . atorvastatin (LIPITOR) 10 MG tablet Take 1 tablet (10 mg total) by mouth daily. 30 tablet 1  . clopidogrel (PLAVIX) 75 MG tablet Take 1 tablet (75 mg total) by mouth daily with breakfast. 30 tablet 1  . glimepiride (AMARYL) 4 MG tablet Take 4 mg by mouth daily with breakfast.    . metFORMIN (GLUCOPHAGE) 850 MG tablet Take 850 mg by mouth every 8 (eight) hours.     Marland Kitchen omeprazole (PRILOSEC) 20 MG capsule Take 20 mg by mouth daily.    . valsartan (DIOVAN) 80 MG tablet   0   No current facility-administered medications for this visit.     ROS: See HPI for pertinent positives and negatives.  Physical Examination  Vitals:   07/24/16 1151 07/24/16 1156 07/24/16 1157  BP: (!) 180/77 (!) 193/83 (!) 180/90  Pulse: 63    Resp: 20    Temp: 98.6 F (37 C)    TempSrc: Oral    SpO2: 92%    Weight: 256 lb (116.1 kg)    Height: 5' 6.5" (1.689 m)     Body mass index is 40.7 kg/m.  General: A&O x 3, WDWN, morbidly obese male. Gait: normal Eyes: PERRLA. Pulmonary: Respirations are non labored, CTAB, without wheezes, rales, or rhonchi. Cardiac: regular rhythm and rate, no detected murmur.    Carotid Bruits Right Left   Negative Positive  Aorta is notpalpable. Radial pulses: 2+ palpable and  =  VASCULAR EXAM: Extremitieswithoutischemic changes, withoutGangrene; withoutopen wounds.  LE Pulses Right Left  FEMORAL not palpable notpalpable  POPLITEAL notpalpable notpalpable  POSTERIOR TIBIAL notpalpable notpalpable  DORSALIS PEDIS ANTERIOR TIBIAL Faintly palpable 1+palpable   Abdomen: softly obese, NT, no palpable masses. Skin: no rashes, no ulcers. Musculoskeletal: no muscle wasting or atrophy. Neurologic: A&O X 3; Appropriate Affect ; SENSATION: normal; MOTOR FUNCTION: moving all extremities equally, motor strength 5/5 throughout. Speech is fluent/normal. CN 2-12 intact.     ASSESSMENT: Daniel Warner is a 76 y.o. male who is s/p right SFA stenting x 2 on 10/16/14. He has a tired feeling in both calves after walking about a block, relieved immediately by rest.  He has no signs of ischemia in his feet/legs.  He has a left carotid bruit.  Carotid duplex on 01-24-16 showed minimal stenosis in the bilateral ICA, normal bilateral CCA, normal vertebral and subclavian arteries.   His atherosclerotic risk factors include DM and obesity. Fortunately he has never used tobacco. He takes a statin and Plavix.    DATA  Right LE Arterial Duplex (07/24/16): 50-74% stenosis of the right SFA origin. 50-75% stenosis involving the end of the stent located in the proximal SFA. 50-74% stenosis at the distal SFA (239 cm/s, 267 cm/s) (was 316 cm/s on 01-24-16).  Unable to adequately identify the proximal and distal ends of the stent.  Stable stent velocities compared to 01-24-16.    ABI (Date: 07/24/2016)  R:   ABI: 0.89 (was 0.96 on 01-24-16),   PT: bi  DP: bi  TBI:  0.53 (was 0.62)  L:   ABI: 0.95 (was 1.03),   PT: bi  DP: bi  TBI: 0.70 (was 0.76) Mild decline in bilateral ABI: mild arterial occlusive disease on the right, normal on the left. All biphasic waveforms.        Carotid Duplex (01-24-16): <40% bilateral ICA stenosis.  Bilateral vertebral artery flow is antegrade.  Bilateral subclavian artery waveforms are normal.  No significant change from 2013 exam.    PLAN:  Graduated walking program discussed and how to achieve.   Based on the patient's vascular studies and examination, pt will return to clinic in 6 monthswith right LE arterial duplex, ABI's; carotid duplex in 2 1/2 years. I advised him and his wife to notify us if he develops concerns re the circulation in his feet of legs.   I discussed in depth with the patient the nature of atherosclerosis, and emphasized the importance of maximal medical management including strict control of blood pressure, blood glucose, and lipid levels, obtaining regular exercise, and continued cessation of smoking.  The patient is aware that without maximal medical management the underlying atherosclerotic disease process will progress, limiting the benefit of any interventions.  The patient was given information about PAD including signs, symptoms, treatment, what symptoms should prompt the patient to seek immediate medical care, and risk reduction measures to take.  Clemon Chambers, RN, MSN, FNP-C [facility] Phone: [phone]  Clinic MD: Donzetta Matters  07/24/16 11:59 AM

## 2016-07-27 NOTE — Addendum Note (Signed)
Addended by: Lianne Cure A on: 07/27/2016 11:59 AM   Modules accepted: Orders

## 2016-07-31 DIAGNOSIS — G4733 Obstructive sleep apnea (adult) (pediatric): Secondary | ICD-10-CM | POA: Diagnosis not present

## 2016-08-17 DIAGNOSIS — Z79899 Other long term (current) drug therapy: Secondary | ICD-10-CM | POA: Diagnosis not present

## 2016-09-02 ENCOUNTER — Ambulatory Visit (INDEPENDENT_AMBULATORY_CARE_PROVIDER_SITE_OTHER): Payer: Medicare HMO | Admitting: Podiatry

## 2016-09-02 ENCOUNTER — Encounter: Payer: Self-pay | Admitting: Podiatry

## 2016-09-02 DIAGNOSIS — M79676 Pain in unspecified toe(s): Secondary | ICD-10-CM

## 2016-09-02 DIAGNOSIS — B351 Tinea unguium: Secondary | ICD-10-CM

## 2016-09-02 DIAGNOSIS — E1142 Type 2 diabetes mellitus with diabetic polyneuropathy: Secondary | ICD-10-CM | POA: Diagnosis not present

## 2016-09-02 NOTE — Patient Instructions (Signed)
Removed Band-Aid on the left great toe and 1-3 days and apply topical antibiotic ointment and a Band-Aid daily until a scab forms  Diabetes and Foot Care Diabetes may cause you to have problems because of poor blood supply (circulation) to your feet and legs. This may cause the skin on your feet to become thinner, break easier, and heal more slowly. Your skin may become dry, and the skin may peel and crack. You may also have nerve damage in your legs and feet causing decreased feeling in them. You may not notice minor injuries to your feet that could lead to infections or more serious problems. Taking care of your feet is one of the most important things you can do for yourself. Follow these instructions at home:  Wear shoes at all times, even in the house. Do not go barefoot. Bare feet are easily injured.  Check your feet daily for blisters, cuts, and redness. If you cannot see the bottom of your feet, use a mirror or ask someone for help.  Wash your feet with warm water (do not use hot water) and mild soap. Then pat your feet and the areas between your toes until they are completely dry. Do not soak your feet as this can dry your skin.  Apply a moisturizing lotion or petroleum jelly (that does not contain alcohol and is unscented) to the skin on your feet and to dry, brittle toenails. Do not apply lotion between your toes.  Trim your toenails straight across. Do not dig under them or around the cuticle. File the edges of your nails with an emery board or nail file.  Do not cut corns or calluses or try to remove them with medicine.  Wear clean socks or stockings every day. Make sure they are not too tight. Do not wear knee-high stockings since they may decrease blood flow to your legs.  Wear shoes that fit properly and have enough cushioning. To break in new shoes, wear them for just a few hours a day. This prevents you from injuring your feet. Always look in your shoes before you put them on to  be sure there are no objects inside.  Do not cross your legs. This may decrease the blood flow to your feet.  If you find a minor scrape, cut, or break in the skin on your feet, keep it and the skin around it clean and dry. These areas may be cleansed with mild soap and water. Do not cleanse the area with peroxide, alcohol, or iodine.  When you remove an adhesive bandage, be sure not to damage the skin around it.  If you have a wound, look at it several times a day to make sure it is healing.  Do not use heating pads or hot water bottles. They may burn your skin. If you have lost feeling in your feet or legs, you may not know it is happening until it is too late.  Make sure your health care provider performs a complete foot exam at least annually or more often if you have foot problems. Report any cuts, sores, or bruises to your health care provider immediately. Contact a health care provider if:  You have an injury that is not healing.  You have cuts or breaks in the skin.  You have an ingrown nail.  You notice redness on your legs or feet.  You feel burning or tingling in your legs or feet.  You have pain or cramps in your legs  and feet.  Your legs or feet are numb.  Your feet always feel cold. Get help right away if:  There is increasing redness, swelling, or pain in or around a wound.  There is a red line that goes up your leg.  Pus is coming from a wound.  You develop a fever or as directed by your health care provider.  You notice a bad smell coming from an ulcer or wound. This information is not intended to replace advice given to you by your health care provider. Make sure you discuss any questions you have with your health care provider. Document Released: 12/27/1999 Document Revised: 06/06/2015 Document Reviewed: 06/07/2012 Elsevier Interactive Patient Education  2017 [employer].

## 2016-09-02 NOTE — Progress Notes (Signed)
Patient ID: Daniel Warner, male   DOB: May 08, 1940, 76 y.o.   MRN: [id number]   Subjective: This patient presents today complaining of thickened and elongated toenails which are uncomfortable walking wearing shoes and request nail debridement  Objective: Orientated 3 No open skin lesions bilaterally Pitting edema ankles bilaterally DP and PT pulses 2/4 bilaterally Capillary reflex immediate bilaterally Sensation to 10 g monofilament wire intact 4/5 right and 3/5 left Ankle reflexes reactive bilaterally Vibratory sensation nonreactive bilaterally The toenails are elongated, brittle, deformed, tender to direct palpation 6-10 Absent hair growth and atrophic skin bilaterally Pes planus bilaterally Manual motor testing dorsi flexion, plantar flexion 5/5 bilaterally  Assessment: Symptomatic onychomycoses 6-10 Diabetic with a history of peripheral arterial disease Diabetic with peripheral neuropathy  Plan: Debridement toenails 6-10 mechanically and electronically withwith slight bleeding distal first leftt toe treated with topical antibiotic ointment. Patient instructed removed Band-Aid 1-3 days and continue apply topical antibiotic ointment and a Band-Aid until a scab forms  Reappoint 3 months

## 2016-09-18 DIAGNOSIS — E1121 Type 2 diabetes mellitus with diabetic nephropathy: Secondary | ICD-10-CM | POA: Diagnosis not present

## 2016-09-18 DIAGNOSIS — Z6841 Body Mass Index (BMI) 40.0 and over, adult: Secondary | ICD-10-CM | POA: Diagnosis not present

## 2016-09-18 DIAGNOSIS — E1151 Type 2 diabetes mellitus with diabetic peripheral angiopathy without gangrene: Secondary | ICD-10-CM | POA: Diagnosis not present

## 2016-09-18 DIAGNOSIS — E113293 Type 2 diabetes mellitus with mild nonproliferative diabetic retinopathy without macular edema, bilateral: Secondary | ICD-10-CM | POA: Diagnosis not present

## 2016-09-18 DIAGNOSIS — N183 Chronic kidney disease, stage 3 (moderate): Secondary | ICD-10-CM | POA: Diagnosis not present

## 2016-09-18 DIAGNOSIS — I129 Hypertensive chronic kidney disease with stage 1 through stage 4 chronic kidney disease, or unspecified chronic kidney disease: Secondary | ICD-10-CM | POA: Diagnosis not present

## 2016-09-30 DIAGNOSIS — Z23 Encounter for immunization: Secondary | ICD-10-CM | POA: Diagnosis not present

## 2016-10-09 DIAGNOSIS — I10 Essential (primary) hypertension: Secondary | ICD-10-CM | POA: Diagnosis not present

## 2016-10-09 DIAGNOSIS — E1151 Type 2 diabetes mellitus with diabetic peripheral angiopathy without gangrene: Secondary | ICD-10-CM | POA: Diagnosis not present

## 2016-10-09 DIAGNOSIS — N183 Chronic kidney disease, stage 3 (moderate): Secondary | ICD-10-CM | POA: Diagnosis not present

## 2016-10-09 DIAGNOSIS — I129 Hypertensive chronic kidney disease with stage 1 through stage 4 chronic kidney disease, or unspecified chronic kidney disease: Secondary | ICD-10-CM | POA: Diagnosis not present

## 2016-10-09 DIAGNOSIS — E1121 Type 2 diabetes mellitus with diabetic nephropathy: Secondary | ICD-10-CM | POA: Diagnosis not present

## 2016-10-12 DIAGNOSIS — H401132 Primary open-angle glaucoma, bilateral, moderate stage: Secondary | ICD-10-CM | POA: Diagnosis not present

## 2016-10-12 DIAGNOSIS — H2513 Age-related nuclear cataract, bilateral: Secondary | ICD-10-CM | POA: Diagnosis not present

## 2016-10-12 DIAGNOSIS — H524 Presbyopia: Secondary | ICD-10-CM | POA: Diagnosis not present

## 2016-10-12 DIAGNOSIS — E119 Type 2 diabetes mellitus without complications: Secondary | ICD-10-CM | POA: Diagnosis not present

## 2016-11-09 DIAGNOSIS — I10 Essential (primary) hypertension: Secondary | ICD-10-CM | POA: Diagnosis not present

## 2016-11-09 DIAGNOSIS — N183 Chronic kidney disease, stage 3 (moderate): Secondary | ICD-10-CM | POA: Diagnosis not present

## 2016-11-09 DIAGNOSIS — E1121 Type 2 diabetes mellitus with diabetic nephropathy: Secondary | ICD-10-CM | POA: Diagnosis not present

## 2016-11-09 DIAGNOSIS — I129 Hypertensive chronic kidney disease with stage 1 through stage 4 chronic kidney disease, or unspecified chronic kidney disease: Secondary | ICD-10-CM | POA: Diagnosis not present

## 2016-11-09 DIAGNOSIS — N184 Chronic kidney disease, stage 4 (severe): Secondary | ICD-10-CM | POA: Diagnosis not present

## 2016-11-09 DIAGNOSIS — E1142 Type 2 diabetes mellitus with diabetic polyneuropathy: Secondary | ICD-10-CM | POA: Diagnosis not present

## 2016-11-09 DIAGNOSIS — E1151 Type 2 diabetes mellitus with diabetic peripheral angiopathy without gangrene: Secondary | ICD-10-CM | POA: Diagnosis not present

## 2016-11-19 DIAGNOSIS — I129 Hypertensive chronic kidney disease with stage 1 through stage 4 chronic kidney disease, or unspecified chronic kidney disease: Secondary | ICD-10-CM | POA: Diagnosis not present

## 2016-11-19 DIAGNOSIS — E1142 Type 2 diabetes mellitus with diabetic polyneuropathy: Secondary | ICD-10-CM | POA: Diagnosis not present

## 2016-11-19 DIAGNOSIS — E1121 Type 2 diabetes mellitus with diabetic nephropathy: Secondary | ICD-10-CM | POA: Diagnosis not present

## 2016-11-19 DIAGNOSIS — Z7984 Long term (current) use of oral hypoglycemic drugs: Secondary | ICD-10-CM | POA: Diagnosis not present

## 2016-11-19 DIAGNOSIS — E1151 Type 2 diabetes mellitus with diabetic peripheral angiopathy without gangrene: Secondary | ICD-10-CM | POA: Diagnosis not present

## 2016-11-19 DIAGNOSIS — N184 Chronic kidney disease, stage 4 (severe): Secondary | ICD-10-CM | POA: Diagnosis not present

## 2016-11-30 DIAGNOSIS — E1142 Type 2 diabetes mellitus with diabetic polyneuropathy: Secondary | ICD-10-CM | POA: Diagnosis not present

## 2016-11-30 DIAGNOSIS — Z7984 Long term (current) use of oral hypoglycemic drugs: Secondary | ICD-10-CM | POA: Diagnosis not present

## 2016-11-30 DIAGNOSIS — I129 Hypertensive chronic kidney disease with stage 1 through stage 4 chronic kidney disease, or unspecified chronic kidney disease: Secondary | ICD-10-CM | POA: Diagnosis not present

## 2016-11-30 DIAGNOSIS — N184 Chronic kidney disease, stage 4 (severe): Secondary | ICD-10-CM | POA: Diagnosis not present

## 2016-11-30 DIAGNOSIS — E1121 Type 2 diabetes mellitus with diabetic nephropathy: Secondary | ICD-10-CM | POA: Diagnosis not present

## 2016-11-30 DIAGNOSIS — I10 Essential (primary) hypertension: Secondary | ICD-10-CM | POA: Diagnosis not present

## 2016-11-30 DIAGNOSIS — E1151 Type 2 diabetes mellitus with diabetic peripheral angiopathy without gangrene: Secondary | ICD-10-CM | POA: Diagnosis not present

## 2016-12-08 ENCOUNTER — Ambulatory Visit (INDEPENDENT_AMBULATORY_CARE_PROVIDER_SITE_OTHER): Payer: Medicare HMO | Admitting: Podiatry

## 2016-12-08 ENCOUNTER — Encounter: Payer: Self-pay | Admitting: Podiatry

## 2016-12-08 DIAGNOSIS — D689 Coagulation defect, unspecified: Secondary | ICD-10-CM

## 2016-12-08 DIAGNOSIS — I739 Peripheral vascular disease, unspecified: Secondary | ICD-10-CM

## 2016-12-08 DIAGNOSIS — M79674 Pain in right toe(s): Secondary | ICD-10-CM | POA: Diagnosis not present

## 2016-12-08 DIAGNOSIS — M79675 Pain in left toe(s): Secondary | ICD-10-CM | POA: Diagnosis not present

## 2016-12-08 DIAGNOSIS — B351 Tinea unguium: Secondary | ICD-10-CM

## 2016-12-08 DIAGNOSIS — E1142 Type 2 diabetes mellitus with diabetic polyneuropathy: Secondary | ICD-10-CM

## 2016-12-08 NOTE — Patient Instructions (Signed)
Remove the Band-Aid in the third right toe 1-3 days apply topical antibiotic ointment and Band-Aid daily until a scab forms  Diabetes and Foot Care Diabetes may cause you to have problems because of poor blood supply (circulation) to your feet and legs. This may cause the skin on your feet to become thinner, break easier, and heal more slowly. Your skin may become dry, and the skin may peel and crack. You may also have nerve damage in your legs and feet causing decreased feeling in them. You may not notice minor injuries to your feet that could lead to infections or more serious problems. Taking care of your feet is one of the most important things you can do for yourself. Follow these instructions at home:  Wear shoes at all times, even in the house. Do not go barefoot. Bare feet are easily injured.  Check your feet daily for blisters, cuts, and redness. If you cannot see the bottom of your feet, use a mirror or ask someone for help.  Wash your feet with warm water (do not use hot water) and mild soap. Then pat your feet and the areas between your toes until they are completely dry. Do not soak your feet as this can dry your skin.  Apply a moisturizing lotion or petroleum jelly (that does not contain alcohol and is unscented) to the skin on your feet and to dry, brittle toenails. Do not apply lotion between your toes.  Trim your toenails straight across. Do not dig under them or around the cuticle. File the edges of your nails with an emery board or nail file.  Do not cut corns or calluses or try to remove them with medicine.  Wear clean socks or stockings every day. Make sure they are not too tight. Do not wear knee-high stockings since they may decrease blood flow to your legs.  Wear shoes that fit properly and have enough cushioning. To break in new shoes, wear them for just a few hours a day. This prevents you from injuring your feet. Always look in your shoes before you put them on to be  sure there are no objects inside.  Do not cross your legs. This may decrease the blood flow to your feet.  If you find a minor scrape, cut, or break in the skin on your feet, keep it and the skin around it clean and dry. These areas may be cleansed with mild soap and water. Do not cleanse the area with peroxide, alcohol, or iodine.  When you remove an adhesive bandage, be sure not to damage the skin around it.  If you have a wound, look at it several times a day to make sure it is healing.  Do not use heating pads or hot water bottles. They may burn your skin. If you have lost feeling in your feet or legs, you may not know it is happening until it is too late.  Make sure your health care provider performs a complete foot exam at least annually or more often if you have foot problems. Report any cuts, sores, or bruises to your health care provider immediately. Contact a health care provider if:  You have an injury that is not healing.  You have cuts or breaks in the skin.  You have an ingrown nail.  You notice redness on your legs or feet.  You feel burning or tingling in your legs or feet.  You have pain or cramps in your legs and feet.  Your legs or feet are numb.  Your feet always feel cold. Get help right away if:  There is increasing redness, swelling, or pain in or around a wound.  There is a red line that goes up your leg.  Pus is coming from a wound.  You develop a fever or as directed by your health care provider.  You notice a bad smell coming from an ulcer or wound. This information is not intended to replace advice given to you by your health care provider. Make sure you discuss any questions you have with your health care provider. Document Released: 12/27/1999 Document Revised: 06/06/2015 Document Reviewed: 06/07/2012 Elsevier Interactive Patient Education  2017 [employer].

## 2016-12-08 NOTE — Progress Notes (Signed)
Patient ID: Daniel Warner, male   DOB: 1940/03/04, 76 y.o.   MRN: [id number]    Subjective: This patient presents today complaining of thickened and elongated toenails which are uncomfortable walking wearing shoes and request nail debridement  Objective: Orientated 3 No open skin lesions bilaterally Pitting edema ankles bilaterally DP and PT pulses 2/4 bilaterally Capillary reflex immediate bilaterally Sensation to 10 g monofilament wire intact 4/5 right and 3/5 left Ankle reflexes reactive bilaterally Vibratory sensation nonreactive bilaterally The toenails are elongated, brittle, deformed, tender to direct palpation 6-10 Absent hair growth and atrophic skin bilaterally Pes planus bilaterally Manual motor testing dorsi flexion, plantar flexion 5/5 bilaterally  Assessment: Symptomatic onychomycoses 6-10 Diabetic with a history of peripheral arterial disease Diabetic with peripheral neuropathy  Plan: Debridement toenails 6-10 mechanically and electronically withwith slight bleeding distal third right toe treated with topical antibiotic ointment. Patient instructed removed Band-Aid 1-3 days and continue apply topical antibiotic ointment and a Band-Aid until a scab forms  Reappoint 3 months

## 2017-01-22 DIAGNOSIS — I129 Hypertensive chronic kidney disease with stage 1 through stage 4 chronic kidney disease, or unspecified chronic kidney disease: Secondary | ICD-10-CM | POA: Diagnosis not present

## 2017-01-22 DIAGNOSIS — E1151 Type 2 diabetes mellitus with diabetic peripheral angiopathy without gangrene: Secondary | ICD-10-CM | POA: Diagnosis not present

## 2017-01-22 DIAGNOSIS — E1142 Type 2 diabetes mellitus with diabetic polyneuropathy: Secondary | ICD-10-CM | POA: Diagnosis not present

## 2017-01-22 DIAGNOSIS — N184 Chronic kidney disease, stage 4 (severe): Secondary | ICD-10-CM | POA: Diagnosis not present

## 2017-01-22 DIAGNOSIS — Z7984 Long term (current) use of oral hypoglycemic drugs: Secondary | ICD-10-CM | POA: Diagnosis not present

## 2017-01-22 DIAGNOSIS — E1121 Type 2 diabetes mellitus with diabetic nephropathy: Secondary | ICD-10-CM | POA: Diagnosis not present

## 2017-01-22 DIAGNOSIS — I10 Essential (primary) hypertension: Secondary | ICD-10-CM | POA: Diagnosis not present

## 2017-01-25 DIAGNOSIS — J069 Acute upper respiratory infection, unspecified: Secondary | ICD-10-CM | POA: Diagnosis not present

## 2017-01-25 DIAGNOSIS — B9789 Other viral agents as the cause of diseases classified elsewhere: Secondary | ICD-10-CM | POA: Diagnosis not present

## 2017-01-26 ENCOUNTER — Inpatient Hospital Stay (HOSPITAL_COMMUNITY): Admission: RE | Admit: 2017-01-26 | Payer: Medicare HMO | Source: Ambulatory Visit

## 2017-01-26 ENCOUNTER — Ambulatory Visit: Payer: Medicare HMO | Admitting: Family

## 2017-01-26 ENCOUNTER — Encounter (HOSPITAL_COMMUNITY): Payer: Medicare HMO

## 2017-02-09 DIAGNOSIS — K219 Gastro-esophageal reflux disease without esophagitis: Secondary | ICD-10-CM | POA: Diagnosis not present

## 2017-02-09 DIAGNOSIS — N184 Chronic kidney disease, stage 4 (severe): Secondary | ICD-10-CM | POA: Diagnosis not present

## 2017-02-09 DIAGNOSIS — E785 Hyperlipidemia, unspecified: Secondary | ICD-10-CM | POA: Diagnosis not present

## 2017-02-09 DIAGNOSIS — E1122 Type 2 diabetes mellitus with diabetic chronic kidney disease: Secondary | ICD-10-CM | POA: Diagnosis not present

## 2017-02-09 DIAGNOSIS — E1142 Type 2 diabetes mellitus with diabetic polyneuropathy: Secondary | ICD-10-CM | POA: Diagnosis not present

## 2017-02-09 DIAGNOSIS — I129 Hypertensive chronic kidney disease with stage 1 through stage 4 chronic kidney disease, or unspecified chronic kidney disease: Secondary | ICD-10-CM | POA: Diagnosis not present

## 2017-02-09 DIAGNOSIS — I739 Peripheral vascular disease, unspecified: Secondary | ICD-10-CM | POA: Diagnosis not present

## 2017-02-09 DIAGNOSIS — D631 Anemia in chronic kidney disease: Secondary | ICD-10-CM | POA: Diagnosis not present

## 2017-02-09 DIAGNOSIS — N2581 Secondary hyperparathyroidism of renal origin: Secondary | ICD-10-CM | POA: Diagnosis not present

## 2017-02-11 ENCOUNTER — Other Ambulatory Visit: Payer: Self-pay | Admitting: Nephrology

## 2017-02-11 DIAGNOSIS — N184 Chronic kidney disease, stage 4 (severe): Secondary | ICD-10-CM

## 2017-02-17 DIAGNOSIS — Z6841 Body Mass Index (BMI) 40.0 and over, adult: Secondary | ICD-10-CM | POA: Diagnosis not present

## 2017-02-17 DIAGNOSIS — I129 Hypertensive chronic kidney disease with stage 1 through stage 4 chronic kidney disease, or unspecified chronic kidney disease: Secondary | ICD-10-CM | POA: Diagnosis not present

## 2017-02-17 DIAGNOSIS — Z7984 Long term (current) use of oral hypoglycemic drugs: Secondary | ICD-10-CM | POA: Diagnosis not present

## 2017-02-17 DIAGNOSIS — N184 Chronic kidney disease, stage 4 (severe): Secondary | ICD-10-CM | POA: Diagnosis not present

## 2017-02-17 DIAGNOSIS — E1151 Type 2 diabetes mellitus with diabetic peripheral angiopathy without gangrene: Secondary | ICD-10-CM | POA: Diagnosis not present

## 2017-02-17 DIAGNOSIS — E1121 Type 2 diabetes mellitus with diabetic nephropathy: Secondary | ICD-10-CM | POA: Diagnosis not present

## 2017-02-23 ENCOUNTER — Other Ambulatory Visit: Payer: Self-pay | Admitting: Nephrology

## 2017-02-23 ENCOUNTER — Ambulatory Visit
Admission: RE | Admit: 2017-02-23 | Discharge: 2017-02-23 | Disposition: A | Discharge status: Home / Self Care | Payer: PPO | Source: Ambulatory Visit | Attending: Nephrology | Admitting: Nephrology

## 2017-02-23 DIAGNOSIS — N184 Chronic kidney disease, stage 4 (severe): Secondary | ICD-10-CM

## 2017-03-01 ENCOUNTER — Other Ambulatory Visit (HOSPITAL_COMMUNITY): Payer: Self-pay | Admitting: Nephrology

## 2017-03-01 DIAGNOSIS — I10 Essential (primary) hypertension: Secondary | ICD-10-CM

## 2017-03-01 DIAGNOSIS — N184 Chronic kidney disease, stage 4 (severe): Secondary | ICD-10-CM | POA: Diagnosis not present

## 2017-03-01 DIAGNOSIS — K219 Gastro-esophageal reflux disease without esophagitis: Secondary | ICD-10-CM | POA: Diagnosis not present

## 2017-03-01 DIAGNOSIS — I129 Hypertensive chronic kidney disease with stage 1 through stage 4 chronic kidney disease, or unspecified chronic kidney disease: Secondary | ICD-10-CM | POA: Diagnosis not present

## 2017-03-01 DIAGNOSIS — D631 Anemia in chronic kidney disease: Secondary | ICD-10-CM | POA: Diagnosis not present

## 2017-03-01 DIAGNOSIS — I2510 Atherosclerotic heart disease of native coronary artery without angina pectoris: Secondary | ICD-10-CM | POA: Diagnosis not present

## 2017-03-01 DIAGNOSIS — N2581 Secondary hyperparathyroidism of renal origin: Secondary | ICD-10-CM | POA: Diagnosis not present

## 2017-03-01 DIAGNOSIS — E785 Hyperlipidemia, unspecified: Secondary | ICD-10-CM | POA: Diagnosis not present

## 2017-03-01 DIAGNOSIS — E1142 Type 2 diabetes mellitus with diabetic polyneuropathy: Secondary | ICD-10-CM | POA: Diagnosis not present

## 2017-03-01 DIAGNOSIS — E1122 Type 2 diabetes mellitus with diabetic chronic kidney disease: Secondary | ICD-10-CM | POA: Diagnosis not present

## 2017-03-01 DIAGNOSIS — I739 Peripheral vascular disease, unspecified: Secondary | ICD-10-CM | POA: Diagnosis not present

## 2017-03-08 ENCOUNTER — Other Ambulatory Visit (HOSPITAL_COMMUNITY): Payer: Self-pay | Admitting: Nephrology

## 2017-03-08 ENCOUNTER — Ambulatory Visit (HOSPITAL_COMMUNITY)
Admission: RE | Admit: 2017-03-08 | Discharge: 2017-03-08 | Disposition: A | Discharge status: Home / Self Care | Payer: PPO | Source: Ambulatory Visit | Attending: Geriatric Medicine | Admitting: Geriatric Medicine

## 2017-03-08 DIAGNOSIS — I519 Heart disease, unspecified: Secondary | ICD-10-CM | POA: Insufficient documentation

## 2017-03-08 DIAGNOSIS — I159 Secondary hypertension, unspecified: Secondary | ICD-10-CM

## 2017-03-08 DIAGNOSIS — I10 Essential (primary) hypertension: Secondary | ICD-10-CM

## 2017-03-08 NOTE — Progress Notes (Signed)
Preliminary results by tech - Renal Duplex Completed. Technically difficult study due to patient's body habitus and very gassy. Bilateral renal arteries appear patent with less than 60% stenosis. Oda Cogan, BS, RDMS, RVT

## 2017-03-15 ENCOUNTER — Ambulatory Visit: Payer: Medicare HMO | Admitting: Podiatry

## 2017-03-23 ENCOUNTER — Ambulatory Visit: Payer: PPO | Admitting: Podiatry

## 2017-03-23 DIAGNOSIS — B351 Tinea unguium: Secondary | ICD-10-CM

## 2017-03-23 DIAGNOSIS — D689 Coagulation defect, unspecified: Secondary | ICD-10-CM | POA: Diagnosis not present

## 2017-03-23 DIAGNOSIS — M79674 Pain in right toe(s): Secondary | ICD-10-CM

## 2017-03-23 DIAGNOSIS — M79675 Pain in left toe(s): Secondary | ICD-10-CM | POA: Diagnosis not present

## 2017-03-28 NOTE — Progress Notes (Signed)
Subjective: 77 y.o. returns the office today for painful, elongated, thickened toenails which he cannot trim himself. Denies any redness or drainage around the nails. Denies any acute changes since last appointment and no new complaints today. Denies any systemic complaints such as fevers, chills, nausea, vomiting.   PCP: Lajean Manes, MD   He is on plavix  Objective: AAO 3, NAD DP/PT pulses palpable, CRT less than 3 seconds Protective sensation decreased with Simms Weinstein monofilament Nails hypertrophic, dystrophic, elongated, brittle, discolored 10. There is tenderness overlying the nails 1-5 bilaterally. There is no surrounding erythema or drainage along the nail sites. No open lesions or pre-ulcerative lesions are identified. No other areas of tenderness bilateral lower extremities. No overlying edema, erythema, increased warmth. No pain with calf compression, swelling, warmth, erythema.  Assessment: Patient presents with symptomatic onychomycosis on plavix   Plan: -Treatment options including alternatives, risks, complications were discussed -Nails sharply debrided 10 without complication/bleeding. -Discussed daily foot inspection. If there are any changes, to call the office immediately.  -Follow-up in 3 months or sooner if any problems are to arise. In the meantime, encouraged to call the office with any questions, concerns, changes symptoms.  Celesta Gentile, DPM

## 2017-04-13 DIAGNOSIS — H401132 Primary open-angle glaucoma, bilateral, moderate stage: Secondary | ICD-10-CM | POA: Diagnosis not present

## 2017-04-26 ENCOUNTER — Ambulatory Visit (HOSPITAL_COMMUNITY)
Admission: RE | Admit: 2017-04-26 | Discharge: 2017-04-26 | Disposition: A | Discharge status: Home / Self Care | Payer: PPO | Source: Ambulatory Visit | Attending: Family | Admitting: Family

## 2017-04-26 ENCOUNTER — Other Ambulatory Visit: Payer: Self-pay | Admitting: Family

## 2017-04-26 ENCOUNTER — Encounter: Payer: Self-pay | Admitting: Family

## 2017-04-26 ENCOUNTER — Ambulatory Visit: Payer: PPO | Admitting: Family

## 2017-04-26 ENCOUNTER — Ambulatory Visit (INDEPENDENT_AMBULATORY_CARE_PROVIDER_SITE_OTHER)
Admission: RE | Admit: 2017-04-26 | Discharge: 2017-04-26 | Disposition: A | Discharge status: Home / Self Care | Payer: PPO | Source: Ambulatory Visit | Attending: Family | Admitting: Family

## 2017-04-26 VITALS — BP 198/86 | HR 63 | Temp 97.7°F | Resp 18 | Ht 66.5 in | Wt 247.5 lb

## 2017-04-26 DIAGNOSIS — Z95828 Presence of other vascular implants and grafts: Secondary | ICD-10-CM

## 2017-04-26 DIAGNOSIS — I10 Essential (primary) hypertension: Secondary | ICD-10-CM | POA: Diagnosis not present

## 2017-04-26 DIAGNOSIS — Z95820 Peripheral vascular angioplasty status with implants and grafts: Secondary | ICD-10-CM

## 2017-04-26 DIAGNOSIS — I779 Disorder of arteries and arterioles, unspecified: Secondary | ICD-10-CM

## 2017-04-26 DIAGNOSIS — E785 Hyperlipidemia, unspecified: Secondary | ICD-10-CM | POA: Insufficient documentation

## 2017-04-26 DIAGNOSIS — I70291 Other atherosclerosis of native arteries of extremities, right leg: Secondary | ICD-10-CM | POA: Diagnosis not present

## 2017-04-26 NOTE — Progress Notes (Signed)
[facility]    [facility]: Follow up peripheral artery occlusive disease  History of Present Illness Daniel Warner is a 77 y.o. male who is s/p right superficial femoral artery angioplasty and stenting x 2 on 10/16/14 by Dr. Donnetta Hutching. He presented to the emergency department on 10/15/2014 with progressive rest pain in his right foot and superficial tissue loss on the toes of his right foot. He underwent aortogram with runoff by Dr. Oren Binet found to have high-grade stenosis in his right superficial femoral artery. He underwent angioplasty and stenting of 2 different segments. He had an excellent initial result.   He can walks as far as he wants w/o claudication sx's.  He is seeing a nephrologist, pt states his kidney function is at 30%, no serum creatinine result on file since October 2016; was 1.38 at that time with GFR of 57.   Pt states at home his systolic blood pressure [id number], is elevated now, states he has not taken his AM medications yet.   Pt Diabetic: Yes, 9.4 A1C in October, 2016 (review of records), pt does not recall recent A1C Pt smoker: non-smoker  Pt meds include: Statin :Yes Betablocker: No ASA: No Other anticoagulants/antiplatelets: Plavix     Past Medical History:  Diagnosis Date  . Benign hypertension with chronic kidney disease, stage III ([place])   . Carotid artery disease ([place])   . CKD (chronic kidney disease), stage III ([place])   . Diabetes mellitus type 2 with peripheral artery disease ([place])   . Diabetes mellitus without complication ([place])   . Discolored skin    Right leg, thinks it's from a bag that has rubbed against his leg for so long. Doppler 12/2010  . GERD (gastroesophageal reflux disease)   . Hypercholesterolemia   . Hyperlipidemia   . Numbness of toes    In the morning  . Obesity   . Peripheral arterial disease ([place])     Social History Social History   Tobacco Use  . Smoking status: Never Smoker  .  Smokeless tobacco: Never Used  Substance Use Topics  . Alcohol use: No  . Drug use: No    Family History Family History  Problem Relation Age of Onset  . Stroke Father   . Irritable bowel syndrome Mother   . Heart disease Paternal Aunt        CABG  . Heart disease Cousin        CABG    Past Surgical History:  Procedure Laterality Date  . PERIPHERAL VASCULAR CATHETERIZATION N/A 10/16/2014   Procedure: Abdominal Aortogram w/Lower Extremity;  Surgeon: Serafina Mitchell, MD;  Location: [facility];  Service: Cardiovascular;  Laterality: N/A;  . TONSILLECTOMY    . WISDOM TOOTH EXTRACTION      No Known Allergies  Current Outpatient Medications  Medication Sig Dispense Refill  . amLODipine (NORVASC) 2.5 MG tablet   1  . atorvastatin (LIPITOR) 10 MG tablet Take 1 tablet (10 mg total) by mouth daily. 30 tablet 1  . calcitRIOL (ROCALTROL) 0.25 MCG capsule Take 0.25 mcg by mouth daily.  0  . clopidogrel (PLAVIX) 75 MG tablet Take 1 tablet (75 mg total) by mouth daily with breakfast. 30 tablet 1  . furosemide (LASIX) 40 MG tablet Take 40 mg by mouth daily.  0  . glimepiride (AMARYL) 4 MG tablet Take 4 mg by mouth daily with breakfast.    . metFORMIN (GLUCOPHAGE) 850 MG tablet Take 850 mg by mouth every  8 (eight) hours.     Marland Kitchen omeprazole (PRILOSEC) 20 MG capsule Take 20 mg by mouth daily.    . valsartan (DIOVAN) 80 MG tablet   0   No current facility-administered medications for this visit.     ROS: See HPI for pertinent positives and negatives.   Physical Examination  Vitals:   04/26/17 1135 04/26/17 1141  BP: (!) 199/93 (!) 198/86  Pulse: 63   Resp: 18   Temp: 97.7 F (36.5 C)   TempSrc: Oral   SpO2: 96%   Weight: 247 lb 8 oz (112.3 kg)   Height: 5' 6.5" (1.689 m)    Body mass index is 39.35 kg/m.  General: A&O x 3, WDWN, obese male. Gait: normal HENT: no gross abnormalities  Eyes: PERRLA. Pulmonary: Respirations are non labored, CTAB, without wheezes, rales,  or rhonchi. Cardiac: regular rhythm and rate, no detected murmur.   Carotid Bruits Right Left   Negative Positive   Abdominal aortic pulse is notpalpable. Radial pulses: 2+ palpable and =  VASCULAR EXAM: Extremitieswithoutischemic changes, withoutGangrene; withoutopen wounds.  LE Pulses Right Left  FEMORAL not palpable notpalpable  POPLITEAL notpalpable notpalpable  POSTERIOR TIBIAL 1+palpable 1+palpable  DORSALIS PEDIS ANTERIOR TIBIAL 2+ palpable 2+palpable   Abdomen: softly obese, NT, no palpable masses. Skin: no rashes, no cellulitis, no ulcers noted. Musculoskeletal: no muscle wasting or atrophy.  Neurologic: A&O X 3; appropriate affect, Sensation is normal; MOTOR FUNCTION:  moving all extremities equally, motor strength 5/5 throughout. Speech is fluent/normal. CN 2-12 intact. Psychiatric: Thought content is normal, mood appropriate for clinical situation.    ASSESSMENT: Daniel Warner is a 77 y.o. male who is s/p right SFA stenting x 2 on 10/16/14. He can walks as far as he wants w/o claudication sx's.   He has no signs of ischemia in his feet/legs.  He has a left carotid bruit.  Carotid duplex on 01-24-16 showed minimal stenosis in the bilateral ICA, normal bilateral CCA, normal vertebral and subclavian arteries.   His atherosclerotic risk factors include DM and obesity. Fortunately he has never used tobacco. He takes a statin and Plavix.   Pt states at home his systolic blood pressure [id number], is elevated now, states he has not taken his AM medications yet.     DATA  Right LE Arterial Duplex (04/26/17): 30-49% stenosis of the CFA (160 cm/s) 50-74% stenosis in the SFA (226 cm/s) Unable to adequately evaluate proximal and distal ends of stent.  No significant change since the exams on 01-24-16 and 07-24-16.    ABI (Date: 04/26/2017):  R:   ABI: 0.88 (was 0.89 on  07-24-16),   PT: bi  DP: bi  TBI:  0.55 (was 0.53)  L:   ABI: 0.96 (was 0.95),   PT: bi  DP: bi  TBI: 0.63 (was 0.70)  Stable bilateral ABI with all biphasic waveforms; mild disease in the right, no significant disease in the left     Carotid Duplex (01-24-16): <40% bilateral ICA stenosis.  Bilateral vertebral artery flow is antegrade.  Bilateral subclavian artery waveforms are normal.  No significant change from 2013 exam.     PLAN:  Graduated walking program discussed and how to achieve.   Based on the patient's vascular studies and examination, pt will return to clinic in 1 yearwith right LE arterial duplex, ABI's; carotid duplex in 2 years. I advised him to notify us if he develops concerns re the circulation in his feet of legs.    I discussed  in depth with the patient the nature of atherosclerosis, and emphasized the importance of maximal medical management including strict control of blood pressure, blood glucose, and lipid levels, obtaining regular exercise, and continued cessation of smoking.  The patient is aware that without maximal medical management the underlying atherosclerotic disease process will progress, limiting the benefit of any interventions.  The patient was given information about PAD including signs, symptoms, treatment, what symptoms should prompt the patient to seek immediate medical care, and risk reduction measures to take.  Clemon Chambers, RN, MSN, FNP-C [facility] [employer] Phone: [phone]  Clinic MD: Trula Slade  04/26/17 11:45 AM

## 2017-04-26 NOTE — Patient Instructions (Signed)

## 2017-05-24 DIAGNOSIS — E1121 Type 2 diabetes mellitus with diabetic nephropathy: Secondary | ICD-10-CM | POA: Diagnosis not present

## 2017-05-24 DIAGNOSIS — Z23 Encounter for immunization: Secondary | ICD-10-CM | POA: Diagnosis not present

## 2017-05-24 DIAGNOSIS — N184 Chronic kidney disease, stage 4 (severe): Secondary | ICD-10-CM | POA: Diagnosis not present

## 2017-05-24 DIAGNOSIS — Z1389 Encounter for screening for other disorder: Secondary | ICD-10-CM | POA: Diagnosis not present

## 2017-05-24 DIAGNOSIS — Z79899 Other long term (current) drug therapy: Secondary | ICD-10-CM | POA: Diagnosis not present

## 2017-05-24 DIAGNOSIS — G4733 Obstructive sleep apnea (adult) (pediatric): Secondary | ICD-10-CM | POA: Diagnosis not present

## 2017-05-24 DIAGNOSIS — E1142 Type 2 diabetes mellitus with diabetic polyneuropathy: Secondary | ICD-10-CM | POA: Diagnosis not present

## 2017-05-24 DIAGNOSIS — E7800 Pure hypercholesterolemia, unspecified: Secondary | ICD-10-CM | POA: Diagnosis not present

## 2017-05-24 DIAGNOSIS — Z0000 Encounter for general adult medical examination without abnormal findings: Secondary | ICD-10-CM | POA: Diagnosis not present

## 2017-05-24 DIAGNOSIS — I129 Hypertensive chronic kidney disease with stage 1 through stage 4 chronic kidney disease, or unspecified chronic kidney disease: Secondary | ICD-10-CM | POA: Diagnosis not present

## 2017-05-24 DIAGNOSIS — K219 Gastro-esophageal reflux disease without esophagitis: Secondary | ICD-10-CM | POA: Diagnosis not present

## 2017-05-25 DIAGNOSIS — D631 Anemia in chronic kidney disease: Secondary | ICD-10-CM | POA: Diagnosis not present

## 2017-05-25 DIAGNOSIS — I2510 Atherosclerotic heart disease of native coronary artery without angina pectoris: Secondary | ICD-10-CM | POA: Diagnosis not present

## 2017-05-25 DIAGNOSIS — N184 Chronic kidney disease, stage 4 (severe): Secondary | ICD-10-CM | POA: Diagnosis not present

## 2017-05-25 DIAGNOSIS — E1142 Type 2 diabetes mellitus with diabetic polyneuropathy: Secondary | ICD-10-CM | POA: Diagnosis not present

## 2017-05-25 DIAGNOSIS — I129 Hypertensive chronic kidney disease with stage 1 through stage 4 chronic kidney disease, or unspecified chronic kidney disease: Secondary | ICD-10-CM | POA: Diagnosis not present

## 2017-05-25 DIAGNOSIS — N2581 Secondary hyperparathyroidism of renal origin: Secondary | ICD-10-CM | POA: Diagnosis not present

## 2017-05-25 DIAGNOSIS — K219 Gastro-esophageal reflux disease without esophagitis: Secondary | ICD-10-CM | POA: Diagnosis not present

## 2017-05-25 DIAGNOSIS — E785 Hyperlipidemia, unspecified: Secondary | ICD-10-CM | POA: Diagnosis not present

## 2017-06-23 DIAGNOSIS — N184 Chronic kidney disease, stage 4 (severe): Secondary | ICD-10-CM | POA: Diagnosis not present

## 2017-06-24 ENCOUNTER — Encounter: Payer: Self-pay | Admitting: Podiatry

## 2017-06-24 ENCOUNTER — Other Ambulatory Visit (HOSPITAL_BASED_OUTPATIENT_CLINIC_OR_DEPARTMENT_OTHER): Payer: Self-pay

## 2017-06-24 ENCOUNTER — Ambulatory Visit (INDEPENDENT_AMBULATORY_CARE_PROVIDER_SITE_OTHER): Payer: PPO | Admitting: Podiatry

## 2017-06-24 DIAGNOSIS — D689 Coagulation defect, unspecified: Secondary | ICD-10-CM | POA: Diagnosis not present

## 2017-06-24 DIAGNOSIS — G4730 Sleep apnea, unspecified: Secondary | ICD-10-CM

## 2017-06-24 DIAGNOSIS — M79675 Pain in left toe(s): Secondary | ICD-10-CM

## 2017-06-24 DIAGNOSIS — B351 Tinea unguium: Secondary | ICD-10-CM | POA: Diagnosis not present

## 2017-06-24 DIAGNOSIS — R0683 Snoring: Secondary | ICD-10-CM

## 2017-06-24 DIAGNOSIS — M79674 Pain in right toe(s): Secondary | ICD-10-CM

## 2017-06-24 DIAGNOSIS — G4710 Hypersomnia, unspecified: Secondary | ICD-10-CM

## 2017-06-24 DIAGNOSIS — R5383 Other fatigue: Secondary | ICD-10-CM

## 2017-06-28 NOTE — Progress Notes (Signed)
Subjective: 77 y.o. returns the office today for painful, elongated, thickened toenails which he cannot trim himself. Denies any redness or drainage around the nails. Denies any acute changes since last appointment and no new complaints today. Denies any systemic complaints such as fevers, chills, nausea, vomiting.   PCP: Lajean Manes, MD   He is on plavix  He thinks his last A1c is around 7  Objective: AAO 3, NAD DP/PT pulses palpable, CRT less than 3 seconds Protective sensation decreased with Simms Weinstein monofilament Nails hypertrophic, dystrophic, elongated, brittle, discolored 10. There is tenderness overlying the nails 1-5 bilaterally. There is no surrounding erythema or drainage along the nail sites. No open lesions or pre-ulcerative lesions are identified. No other areas of tenderness bilateral lower extremities. No overlying edema, erythema, increased warmth. No pain with calf compression, swelling, warmth, erythema.  Assessment: Patient presents with symptomatic onychomycosis on plavix   Plan: -Treatment options including alternatives, risks, complications were discussed -Nails sharply debrided 10 without complication/bleeding. -Discussed daily foot inspection. If there are any changes, to call the office immediately.  -Follow-up in 3 months or sooner if any problems are to arise. In the meantime, encouraged to call the office with any questions, concerns, changes symptoms.  Celesta Gentile, DPM

## 2017-07-18 ENCOUNTER — Ambulatory Visit (HOSPITAL_BASED_OUTPATIENT_CLINIC_OR_DEPARTMENT_OTHER): Payer: PPO

## 2017-08-23 ENCOUNTER — Encounter (HOSPITAL_BASED_OUTPATIENT_CLINIC_OR_DEPARTMENT_OTHER): Payer: PPO

## 2017-08-24 ENCOUNTER — Ambulatory Visit (HOSPITAL_BASED_OUTPATIENT_CLINIC_OR_DEPARTMENT_OTHER): Payer: PPO | Attending: Internal Medicine | Admitting: Internal Medicine

## 2017-08-24 ENCOUNTER — Other Ambulatory Visit (HOSPITAL_BASED_OUTPATIENT_CLINIC_OR_DEPARTMENT_OTHER): Payer: Self-pay

## 2017-08-24 VITALS — Ht 65.0 in | Wt 256.0 lb

## 2017-08-24 DIAGNOSIS — G4733 Obstructive sleep apnea (adult) (pediatric): Secondary | ICD-10-CM | POA: Insufficient documentation

## 2017-08-24 DIAGNOSIS — R5383 Other fatigue: Secondary | ICD-10-CM

## 2017-08-24 DIAGNOSIS — R0683 Snoring: Secondary | ICD-10-CM

## 2017-08-24 DIAGNOSIS — G4710 Hypersomnia, unspecified: Secondary | ICD-10-CM

## 2017-08-24 DIAGNOSIS — G4730 Sleep apnea, unspecified: Secondary | ICD-10-CM

## 2017-09-05 NOTE — Procedures (Signed)
   NAME: Daniel Warner DATE OF BIRTH:  February 07, 1940 MEDICAL RECORD NUMBER [id number]  LOCATION: [facility]  PHYSICIAN: Marius Ditch  DATE OF STUDY: 08/24/2017  SLEEP STUDY TYPE: Nocturnal Polysomnogram               REFERRING PHYSICIAN: Marius Ditch, MD  INDICATION FOR STUDY: Severe OSA seen on PSG in 7/18; no treatment undertaken in the past.   EPWORTH SLEEPINESS SCORE:  NA HEIGHT: $RemoveBe'5\' 5"'[id number]$  (165.1 cm)  WEIGHT: 256 lb (116.1 kg)    Body mass index is 42.6 kg/m.  NECK SIZE: 17.5 in.  MEDICATIONS  Patient self administered medications include: N/A. Medications administered during study include No sleep medicine administered.Marland Kitchen   SLEEP STUDY TECHNIQUE  A multi-channel overnight Polysomnography study was performed. The channels recorded and monitored were central and occipital EEG, electrooculogram (EOG), submentalis EMG (chin), nasal and oral airflow, thoracic and abdominal wall motion, anterior tibialis EMG, snore microphone, electrocardiogram, and a pulse oximetry.   TECHNICAL COMMENTS  Comments added by Technician: PATIENT WAS ORDERED AS A NPSG ONLY. Comments added by Scorer: N/A   SLEEP ARCHITECTURE  The study was initiated at 2:32:52 AM and terminated at 8:31:04 AM. The total recorded time was 358.2 minutes. EEG confirmed total sleep time was 212.5 minutes yielding a sleep efficiency of 59.3%%. Sleep onset after lights out was 12.9 minutes with a REM latency of 75.5 minutes. The patient spent 55.1%% of the night in stage N1 sleep, 33.9%% in stage N2 sleep, 0.0%% in stage N3 and 11.1% in REM. Wake after sleep onset (WASO) was 132.8 minutes. The Arousal Index was 53.4/hour.   RESPIRATORY PARAMETERS  There were a total of 71 respiratory disturbances out of which 48 were apneas ( 47 obstructive, 0 mixed, 1 central) and 23 hypopneas. The apnea/hypopnea index (AHI) was 20.0 events/hour. The central sleep apnea index was 0.3 events/hour. The REM AHI was 10.2  events/hour and NREM AHI was 21.3 events/hour. The supine AHI was 24.8 events/hour and the non supine AHI was 17.6 events/hour. RDI was 28/hr and REM REDI was 13/hr. Respiratory disturbances were associated with oxygen desaturation down to a nadir of 82.0% during sleep. The mean oxygen saturation during the study was 93.1%. The cumulative time under 88% oxygen saturation was 5.5 minutes.  LEG MOVEMENT DATA  The total leg movements were 0 with a resulting leg movement index of 0.0/hr . Associated arousal with leg movement index was 0.0/hr.   CARDIAC DATA  The underlying cardiac rhythm was most consistent with sinus rhythm. Mean heart rate during sleep was 59.5 bpm. Additional rhythm abnormalities include PVCs.   IMPRESSIONS  Moderate Obstructive Sleep apnea(OSA). OSA seen in his 2018 study was much worse than this (AHI 84/hr) No Significant Central Sleep Apnea (CSA)  No significant periodic leg movements(PLMs) during sleep.   DIAGNOSIS  Obstructive Sleep Apnea (327.23 [G47.33 ICD-10])  RECOMMENDATIONS  In-lab CPAP titruation is recommended due to co-morbidities.    Marius Ditch Sleep specialist, [place] Board of Internal Medicine  ELECTRONICALLY SIGNED ON:  09/05/2017, 9:28 PM [place] PH: [phone]   FX: [id number] [place]

## 2017-09-09 DIAGNOSIS — N2581 Secondary hyperparathyroidism of renal origin: Secondary | ICD-10-CM | POA: Diagnosis not present

## 2017-09-09 DIAGNOSIS — N184 Chronic kidney disease, stage 4 (severe): Secondary | ICD-10-CM | POA: Diagnosis not present

## 2017-09-09 DIAGNOSIS — K219 Gastro-esophageal reflux disease without esophagitis: Secondary | ICD-10-CM | POA: Diagnosis not present

## 2017-09-09 DIAGNOSIS — I129 Hypertensive chronic kidney disease with stage 1 through stage 4 chronic kidney disease, or unspecified chronic kidney disease: Secondary | ICD-10-CM | POA: Diagnosis not present

## 2017-09-09 DIAGNOSIS — E1142 Type 2 diabetes mellitus with diabetic polyneuropathy: Secondary | ICD-10-CM | POA: Diagnosis not present

## 2017-09-09 DIAGNOSIS — E785 Hyperlipidemia, unspecified: Secondary | ICD-10-CM | POA: Diagnosis not present

## 2017-09-09 DIAGNOSIS — D631 Anemia in chronic kidney disease: Secondary | ICD-10-CM | POA: Diagnosis not present

## 2017-09-09 DIAGNOSIS — I2510 Atherosclerotic heart disease of native coronary artery without angina pectoris: Secondary | ICD-10-CM | POA: Diagnosis not present

## 2017-09-10 DIAGNOSIS — N184 Chronic kidney disease, stage 4 (severe): Secondary | ICD-10-CM | POA: Diagnosis not present

## 2017-09-22 DIAGNOSIS — Z7984 Long term (current) use of oral hypoglycemic drugs: Secondary | ICD-10-CM | POA: Diagnosis not present

## 2017-09-22 DIAGNOSIS — E1142 Type 2 diabetes mellitus with diabetic polyneuropathy: Secondary | ICD-10-CM | POA: Diagnosis not present

## 2017-09-22 DIAGNOSIS — E1151 Type 2 diabetes mellitus with diabetic peripheral angiopathy without gangrene: Secondary | ICD-10-CM | POA: Diagnosis not present

## 2017-09-22 DIAGNOSIS — Z23 Encounter for immunization: Secondary | ICD-10-CM | POA: Diagnosis not present

## 2017-09-22 DIAGNOSIS — I129 Hypertensive chronic kidney disease with stage 1 through stage 4 chronic kidney disease, or unspecified chronic kidney disease: Secondary | ICD-10-CM | POA: Diagnosis not present

## 2017-09-22 DIAGNOSIS — Z6839 Body mass index (BMI) 39.0-39.9, adult: Secondary | ICD-10-CM | POA: Diagnosis not present

## 2017-09-22 DIAGNOSIS — E1121 Type 2 diabetes mellitus with diabetic nephropathy: Secondary | ICD-10-CM | POA: Diagnosis not present

## 2017-09-22 DIAGNOSIS — N184 Chronic kidney disease, stage 4 (severe): Secondary | ICD-10-CM | POA: Diagnosis not present

## 2017-09-22 DIAGNOSIS — I10 Essential (primary) hypertension: Secondary | ICD-10-CM | POA: Diagnosis not present

## 2017-09-23 ENCOUNTER — Encounter: Payer: Self-pay | Admitting: Podiatry

## 2017-09-23 ENCOUNTER — Ambulatory Visit (INDEPENDENT_AMBULATORY_CARE_PROVIDER_SITE_OTHER): Payer: PPO | Admitting: Podiatry

## 2017-09-23 DIAGNOSIS — M79675 Pain in left toe(s): Secondary | ICD-10-CM

## 2017-09-23 DIAGNOSIS — B351 Tinea unguium: Secondary | ICD-10-CM | POA: Diagnosis not present

## 2017-09-23 DIAGNOSIS — M79674 Pain in right toe(s): Secondary | ICD-10-CM

## 2017-09-23 DIAGNOSIS — D689 Coagulation defect, unspecified: Secondary | ICD-10-CM

## 2017-09-23 NOTE — Progress Notes (Signed)
Subjective: 77 y.o. returns the office today for painful, elongated, thickened toenails which he cannot trim himself. Denies any redness or drainage around the nails. Denies any acute changes since last appointment and no new complaints today. Denies any systemic complaints such as fevers, chills, nausea, vomiting.   PCP: Lajean Manes, MD   He is on plavix  Objective: AAO 3, NAD DP/PT pulses palpable, CRT less than 3 seconds Protective sensation decreased with Simms Weinstein monofilament Nails hypertrophic, dystrophic, elongated, brittle, discolored 10. The right hallux toenail is more ingrown but there are no signs of infection. There is tenderness overlying the nails 1-5 bilaterally. There is no surrounding erythema or drainage along the nail sites. No open lesions or pre-ulcerative lesions are identified. No other areas of tenderness bilateral lower extremities. No overlying edema, erythema, increased warmth. No pain with calf compression, swelling, warmth, erythema.  Assessment: Patient presents with symptomatic onychomycosis on plavix ; ingrown toenail  Plan: -Treatment options including alternatives, risks, complications were discussed -Nails sharply debrided 10 without complication/bleeding. -Discussed daily foot inspection. If there are any changes, to call the office immediately.  -Follow-up in 3 months or sooner if any problems are to arise. In the meantime, encouraged to call the office with any questions, concerns, changes symptoms.  Celesta Gentile, DPM

## 2017-09-28 DIAGNOSIS — I129 Hypertensive chronic kidney disease with stage 1 through stage 4 chronic kidney disease, or unspecified chronic kidney disease: Secondary | ICD-10-CM | POA: Diagnosis not present

## 2017-10-05 DIAGNOSIS — K5900 Constipation, unspecified: Secondary | ICD-10-CM | POA: Diagnosis not present

## 2017-10-12 DIAGNOSIS — I129 Hypertensive chronic kidney disease with stage 1 through stage 4 chronic kidney disease, or unspecified chronic kidney disease: Secondary | ICD-10-CM | POA: Diagnosis not present

## 2017-10-26 DIAGNOSIS — E119 Type 2 diabetes mellitus without complications: Secondary | ICD-10-CM | POA: Diagnosis not present

## 2017-10-26 DIAGNOSIS — H401132 Primary open-angle glaucoma, bilateral, moderate stage: Secondary | ICD-10-CM | POA: Diagnosis not present

## 2017-10-26 DIAGNOSIS — H2513 Age-related nuclear cataract, bilateral: Secondary | ICD-10-CM | POA: Diagnosis not present

## 2017-11-04 DIAGNOSIS — N184 Chronic kidney disease, stage 4 (severe): Secondary | ICD-10-CM | POA: Diagnosis not present

## 2017-11-04 DIAGNOSIS — E1121 Type 2 diabetes mellitus with diabetic nephropathy: Secondary | ICD-10-CM | POA: Diagnosis not present

## 2017-11-04 DIAGNOSIS — I129 Hypertensive chronic kidney disease with stage 1 through stage 4 chronic kidney disease, or unspecified chronic kidney disease: Secondary | ICD-10-CM | POA: Diagnosis not present

## 2017-11-04 DIAGNOSIS — E1142 Type 2 diabetes mellitus with diabetic polyneuropathy: Secondary | ICD-10-CM | POA: Diagnosis not present

## 2017-11-04 DIAGNOSIS — E1151 Type 2 diabetes mellitus with diabetic peripheral angiopathy without gangrene: Secondary | ICD-10-CM | POA: Diagnosis not present

## 2017-11-04 DIAGNOSIS — I10 Essential (primary) hypertension: Secondary | ICD-10-CM | POA: Diagnosis not present

## 2017-11-23 DIAGNOSIS — E1142 Type 2 diabetes mellitus with diabetic polyneuropathy: Secondary | ICD-10-CM | POA: Diagnosis not present

## 2017-11-23 DIAGNOSIS — I129 Hypertensive chronic kidney disease with stage 1 through stage 4 chronic kidney disease, or unspecified chronic kidney disease: Secondary | ICD-10-CM | POA: Diagnosis not present

## 2017-11-23 DIAGNOSIS — D631 Anemia in chronic kidney disease: Secondary | ICD-10-CM | POA: Diagnosis not present

## 2017-11-23 DIAGNOSIS — I739 Peripheral vascular disease, unspecified: Secondary | ICD-10-CM | POA: Diagnosis not present

## 2017-11-23 DIAGNOSIS — E1122 Type 2 diabetes mellitus with diabetic chronic kidney disease: Secondary | ICD-10-CM | POA: Diagnosis not present

## 2017-11-23 DIAGNOSIS — N184 Chronic kidney disease, stage 4 (severe): Secondary | ICD-10-CM | POA: Diagnosis not present

## 2017-11-23 DIAGNOSIS — K219 Gastro-esophageal reflux disease without esophagitis: Secondary | ICD-10-CM | POA: Diagnosis not present

## 2017-11-23 DIAGNOSIS — I2510 Atherosclerotic heart disease of native coronary artery without angina pectoris: Secondary | ICD-10-CM | POA: Diagnosis not present

## 2017-11-23 DIAGNOSIS — E785 Hyperlipidemia, unspecified: Secondary | ICD-10-CM | POA: Diagnosis not present

## 2017-11-23 DIAGNOSIS — N189 Chronic kidney disease, unspecified: Secondary | ICD-10-CM | POA: Diagnosis not present

## 2017-11-23 DIAGNOSIS — N2581 Secondary hyperparathyroidism of renal origin: Secondary | ICD-10-CM | POA: Diagnosis not present

## 2017-11-23 DIAGNOSIS — Z0000 Encounter for general adult medical examination without abnormal findings: Secondary | ICD-10-CM | POA: Diagnosis not present

## 2017-11-25 DIAGNOSIS — N390 Urinary tract infection, site not specified: Secondary | ICD-10-CM | POA: Diagnosis not present

## 2017-11-25 DIAGNOSIS — Z6838 Body mass index (BMI) 38.0-38.9, adult: Secondary | ICD-10-CM | POA: Diagnosis not present

## 2017-11-25 DIAGNOSIS — E1151 Type 2 diabetes mellitus with diabetic peripheral angiopathy without gangrene: Secondary | ICD-10-CM | POA: Diagnosis not present

## 2017-11-25 DIAGNOSIS — E1121 Type 2 diabetes mellitus with diabetic nephropathy: Secondary | ICD-10-CM | POA: Diagnosis not present

## 2017-11-25 DIAGNOSIS — N184 Chronic kidney disease, stage 4 (severe): Secondary | ICD-10-CM | POA: Diagnosis not present

## 2017-11-25 DIAGNOSIS — E1142 Type 2 diabetes mellitus with diabetic polyneuropathy: Secondary | ICD-10-CM | POA: Diagnosis not present

## 2017-11-25 DIAGNOSIS — I129 Hypertensive chronic kidney disease with stage 1 through stage 4 chronic kidney disease, or unspecified chronic kidney disease: Secondary | ICD-10-CM | POA: Diagnosis not present

## 2017-11-25 DIAGNOSIS — I10 Essential (primary) hypertension: Secondary | ICD-10-CM | POA: Diagnosis not present

## 2017-11-29 DIAGNOSIS — E1121 Type 2 diabetes mellitus with diabetic nephropathy: Secondary | ICD-10-CM | POA: Diagnosis not present

## 2017-11-29 DIAGNOSIS — N184 Chronic kidney disease, stage 4 (severe): Secondary | ICD-10-CM | POA: Diagnosis not present

## 2017-11-29 DIAGNOSIS — E1151 Type 2 diabetes mellitus with diabetic peripheral angiopathy without gangrene: Secondary | ICD-10-CM | POA: Diagnosis not present

## 2017-11-29 DIAGNOSIS — I10 Essential (primary) hypertension: Secondary | ICD-10-CM | POA: Diagnosis not present

## 2017-11-29 DIAGNOSIS — E1142 Type 2 diabetes mellitus with diabetic polyneuropathy: Secondary | ICD-10-CM | POA: Diagnosis not present

## 2017-11-29 DIAGNOSIS — I129 Hypertensive chronic kidney disease with stage 1 through stage 4 chronic kidney disease, or unspecified chronic kidney disease: Secondary | ICD-10-CM | POA: Diagnosis not present

## 2017-12-23 ENCOUNTER — Ambulatory Visit: Payer: PPO | Admitting: Podiatry

## 2017-12-23 ENCOUNTER — Encounter: Payer: Self-pay | Admitting: Podiatry

## 2017-12-23 DIAGNOSIS — M79675 Pain in left toe(s): Secondary | ICD-10-CM | POA: Diagnosis not present

## 2017-12-23 DIAGNOSIS — M79674 Pain in right toe(s): Secondary | ICD-10-CM

## 2017-12-23 DIAGNOSIS — B351 Tinea unguium: Secondary | ICD-10-CM

## 2017-12-23 NOTE — Patient Instructions (Signed)

## 2018-01-23 NOTE — Progress Notes (Signed)
Subjective: Daniel Warner is a 78 y.o. y.o. male who presents today with painful, discolored, thick toenails  which interfere with daily activities. Pain is aggravated when wearing enclosed shoe gear. Pain is relieved with periodic professional debridement.  Pt states his toenail on his left 2nd toe has turned brown. He denies any trauma to toe/foot. He is unsure of onset. It is not painful.   Patient's PCP is [place], Christiane Ha, MD .   Current Outpatient Medications:  .  amLODipine (NORVASC) 2.5 MG tablet, , Disp: , Rfl: 1 .  atorvastatin (LIPITOR) 10 MG tablet, Take 1 tablet (10 mg total) by mouth daily., Disp: 30 tablet, Rfl: 1 .  atorvastatin (LIPITOR) 20 MG tablet, Take 20 mg by mouth daily., Disp: , Rfl: 11 .  calcitRIOL (ROCALTROL) 0.25 MCG capsule, Take 0.25 mcg by mouth daily., Disp: , Rfl: 0 .  CARAFATE 1 GM/10ML suspension, SHAKE LQ AND TK 10 ML PO QD B THE EVE MEAL OES, Disp: , Rfl: 2 .  clopidogrel (PLAVIX) 75 MG tablet, Take 1 tablet (75 mg total) by mouth daily with breakfast., Disp: 30 tablet, Rfl: 1 .  furosemide (LASIX) 40 MG tablet, Take 40 mg by mouth daily., Disp: , Rfl: 0 .  glimepiride (AMARYL) 4 MG tablet, Take 4 mg by mouth daily with breakfast., Disp: , Rfl:  .  metFORMIN (GLUCOPHAGE) 850 MG tablet, Take 850 mg by mouth every 8 (eight) hours. , Disp: , Rfl:  .  omeprazole (PRILOSEC) 20 MG capsule, Take 20 mg by mouth daily., Disp: , Rfl:  .  TRULICITY 2.29 NL/8.9QJ SOPN, , Disp: , Rfl:  .  valsartan (DIOVAN) 80 MG tablet, , Disp: , Rfl: 0   No Known Allergies   Objective:  Vascular Examination: Capillary refill time immediate x 10 digits Dorsalis pedis 2/4 b/l  Posterior tibial pulses faintly palpable b/l Digital hair x 10 digits absent Skin temperature gradient WNL b/l  Dermatological Examination: Skin with normal turgor, texture and tone b/l  Toenails 1-5 b/l discolored, thick, dystrophic with subungual debris and pain with palpation to nailbeds due to  thickness of nails.  Subacute subungual hematoma left 2nd digit. Nailplate remains adhered. No erythema, no edema, no drainage. No pain on palpation.  No open wounds.  No interdigital macerations noted b/l  Musculoskeletal: Muscle strength 5/5 to all LE muscle groups  Neurological: Sensation intact with 10 gram monofilament. Vibratory sensation intact.  Assessment: Painful onychomycosis toenails 1-5 b/l in patient on blood thinner. Contusion left 2nd digit, resolving.   Plan: 1. Toenails 1-5 b/l were debrided in length and girth without iatrogenic bleeding. 2. Left 2nd digit stable and no treatment necessary. 3. Patient to continue soft, supportive shoe gear 4. Patient to report any pedal injuries to medical professional immediately. 5. Avoid self trimming due to use of blood thinner. 6. Follow up 3 months. Patient/POA to call should there be a concern in the interim.

## 2018-02-10 DIAGNOSIS — E1151 Type 2 diabetes mellitus with diabetic peripheral angiopathy without gangrene: Secondary | ICD-10-CM | POA: Diagnosis not present

## 2018-02-10 DIAGNOSIS — E1142 Type 2 diabetes mellitus with diabetic polyneuropathy: Secondary | ICD-10-CM | POA: Diagnosis not present

## 2018-02-10 DIAGNOSIS — N184 Chronic kidney disease, stage 4 (severe): Secondary | ICD-10-CM | POA: Diagnosis not present

## 2018-02-10 DIAGNOSIS — I129 Hypertensive chronic kidney disease with stage 1 through stage 4 chronic kidney disease, or unspecified chronic kidney disease: Secondary | ICD-10-CM | POA: Diagnosis not present

## 2018-02-10 DIAGNOSIS — I10 Essential (primary) hypertension: Secondary | ICD-10-CM | POA: Diagnosis not present

## 2018-02-10 DIAGNOSIS — E1121 Type 2 diabetes mellitus with diabetic nephropathy: Secondary | ICD-10-CM | POA: Diagnosis not present

## 2018-02-24 DIAGNOSIS — I739 Peripheral vascular disease, unspecified: Secondary | ICD-10-CM | POA: Diagnosis not present

## 2018-02-24 DIAGNOSIS — N2581 Secondary hyperparathyroidism of renal origin: Secondary | ICD-10-CM | POA: Diagnosis not present

## 2018-02-24 DIAGNOSIS — I129 Hypertensive chronic kidney disease with stage 1 through stage 4 chronic kidney disease, or unspecified chronic kidney disease: Secondary | ICD-10-CM | POA: Diagnosis not present

## 2018-02-24 DIAGNOSIS — E785 Hyperlipidemia, unspecified: Secondary | ICD-10-CM | POA: Diagnosis not present

## 2018-02-24 DIAGNOSIS — K219 Gastro-esophageal reflux disease without esophagitis: Secondary | ICD-10-CM | POA: Diagnosis not present

## 2018-02-24 DIAGNOSIS — N184 Chronic kidney disease, stage 4 (severe): Secondary | ICD-10-CM | POA: Diagnosis not present

## 2018-02-24 DIAGNOSIS — D631 Anemia in chronic kidney disease: Secondary | ICD-10-CM | POA: Diagnosis not present

## 2018-02-24 DIAGNOSIS — E1122 Type 2 diabetes mellitus with diabetic chronic kidney disease: Secondary | ICD-10-CM | POA: Diagnosis not present

## 2018-02-24 DIAGNOSIS — N189 Chronic kidney disease, unspecified: Secondary | ICD-10-CM | POA: Diagnosis not present

## 2018-02-24 DIAGNOSIS — E1142 Type 2 diabetes mellitus with diabetic polyneuropathy: Secondary | ICD-10-CM | POA: Diagnosis not present

## 2018-02-24 DIAGNOSIS — I2510 Atherosclerotic heart disease of native coronary artery without angina pectoris: Secondary | ICD-10-CM | POA: Diagnosis not present

## 2018-02-24 DIAGNOSIS — Z0000 Encounter for general adult medical examination without abnormal findings: Secondary | ICD-10-CM | POA: Diagnosis not present

## 2018-03-09 DIAGNOSIS — E1151 Type 2 diabetes mellitus with diabetic peripheral angiopathy without gangrene: Secondary | ICD-10-CM | POA: Diagnosis not present

## 2018-03-09 DIAGNOSIS — Z794 Long term (current) use of insulin: Secondary | ICD-10-CM | POA: Diagnosis not present

## 2018-03-09 DIAGNOSIS — N184 Chronic kidney disease, stage 4 (severe): Secondary | ICD-10-CM | POA: Diagnosis not present

## 2018-03-09 DIAGNOSIS — E1142 Type 2 diabetes mellitus with diabetic polyneuropathy: Secondary | ICD-10-CM | POA: Diagnosis not present

## 2018-03-09 DIAGNOSIS — E1121 Type 2 diabetes mellitus with diabetic nephropathy: Secondary | ICD-10-CM | POA: Diagnosis not present

## 2018-03-09 DIAGNOSIS — I129 Hypertensive chronic kidney disease with stage 1 through stage 4 chronic kidney disease, or unspecified chronic kidney disease: Secondary | ICD-10-CM | POA: Diagnosis not present

## 2018-03-09 DIAGNOSIS — I10 Essential (primary) hypertension: Secondary | ICD-10-CM | POA: Diagnosis not present

## 2018-03-24 ENCOUNTER — Ambulatory Visit: Payer: PPO | Admitting: Podiatry

## 2018-03-28 ENCOUNTER — Ambulatory Visit
Admission: RE | Admit: 2018-03-28 | Discharge: 2018-03-28 | Disposition: A | Discharge status: Home / Self Care | Payer: Self-pay | Source: Ambulatory Visit | Attending: Geriatric Medicine | Admitting: Geriatric Medicine

## 2018-03-28 ENCOUNTER — Other Ambulatory Visit: Payer: Self-pay | Admitting: Geriatric Medicine

## 2018-03-28 DIAGNOSIS — J3089 Other allergic rhinitis: Secondary | ICD-10-CM | POA: Diagnosis not present

## 2018-03-28 DIAGNOSIS — I10 Essential (primary) hypertension: Secondary | ICD-10-CM | POA: Diagnosis not present

## 2018-03-28 DIAGNOSIS — R059 Cough, unspecified: Secondary | ICD-10-CM

## 2018-03-28 DIAGNOSIS — R05 Cough: Secondary | ICD-10-CM | POA: Diagnosis not present

## 2018-03-30 DIAGNOSIS — I129 Hypertensive chronic kidney disease with stage 1 through stage 4 chronic kidney disease, or unspecified chronic kidney disease: Secondary | ICD-10-CM | POA: Diagnosis not present

## 2018-03-30 DIAGNOSIS — E1121 Type 2 diabetes mellitus with diabetic nephropathy: Secondary | ICD-10-CM | POA: Diagnosis not present

## 2018-03-30 DIAGNOSIS — E1142 Type 2 diabetes mellitus with diabetic polyneuropathy: Secondary | ICD-10-CM | POA: Diagnosis not present

## 2018-03-30 DIAGNOSIS — E1151 Type 2 diabetes mellitus with diabetic peripheral angiopathy without gangrene: Secondary | ICD-10-CM | POA: Diagnosis not present

## 2018-03-30 DIAGNOSIS — N184 Chronic kidney disease, stage 4 (severe): Secondary | ICD-10-CM | POA: Diagnosis not present

## 2018-03-30 DIAGNOSIS — I10 Essential (primary) hypertension: Secondary | ICD-10-CM | POA: Diagnosis not present

## 2018-04-08 ENCOUNTER — Other Ambulatory Visit: Payer: Self-pay | Admitting: *Deleted

## 2018-04-08 DIAGNOSIS — E1122 Type 2 diabetes mellitus with diabetic chronic kidney disease: Secondary | ICD-10-CM

## 2018-04-08 DIAGNOSIS — G4733 Obstructive sleep apnea (adult) (pediatric): Secondary | ICD-10-CM

## 2018-04-08 DIAGNOSIS — K219 Gastro-esophageal reflux disease without esophagitis: Secondary | ICD-10-CM | POA: Insufficient documentation

## 2018-04-08 DIAGNOSIS — E119 Type 2 diabetes mellitus without complications: Secondary | ICD-10-CM | POA: Insufficient documentation

## 2018-04-08 DIAGNOSIS — N184 Chronic kidney disease, stage 4 (severe): Secondary | ICD-10-CM

## 2018-04-08 NOTE — Patient Outreach (Signed)
  [facility]) Care Management Chronic Special Needs Program  04/08/2018  Name: HYDE SIRES DOB: [birth year]  MRN: [id number]  Mr. Mohammedali Bedoy is enrolled in a chronic special needs plan for  Diabetes. A completed health risk assessment has not been received from the client and client has not responded to outreach attempts by their health care concierge.  The client's individualized care plan was developed based on available data.  Plan:  . Send unsuccessful outreach letter with a copy of individualized care plan to client . Send Emergency planning/management officer to client . Send education on blood pressure self management . Send individualized care plan to provider  Chronic care management coordinator, Thea Silversmith,  will attempt outreach in 2-4 months.   Barrington Ellison RN,CCM,CDE [place] Management Coordinator Office Phone [phone] Office Fax [phone]

## 2018-05-02 DIAGNOSIS — E7800 Pure hypercholesterolemia, unspecified: Secondary | ICD-10-CM | POA: Diagnosis not present

## 2018-05-02 DIAGNOSIS — N184 Chronic kidney disease, stage 4 (severe): Secondary | ICD-10-CM | POA: Diagnosis not present

## 2018-05-02 DIAGNOSIS — I10 Essential (primary) hypertension: Secondary | ICD-10-CM | POA: Diagnosis not present

## 2018-05-02 DIAGNOSIS — E1142 Type 2 diabetes mellitus with diabetic polyneuropathy: Secondary | ICD-10-CM | POA: Diagnosis not present

## 2018-05-02 DIAGNOSIS — E1121 Type 2 diabetes mellitus with diabetic nephropathy: Secondary | ICD-10-CM | POA: Diagnosis not present

## 2018-05-02 DIAGNOSIS — E1151 Type 2 diabetes mellitus with diabetic peripheral angiopathy without gangrene: Secondary | ICD-10-CM | POA: Diagnosis not present

## 2018-05-02 DIAGNOSIS — I129 Hypertensive chronic kidney disease with stage 1 through stage 4 chronic kidney disease, or unspecified chronic kidney disease: Secondary | ICD-10-CM | POA: Diagnosis not present

## 2018-05-24 DIAGNOSIS — I129 Hypertensive chronic kidney disease with stage 1 through stage 4 chronic kidney disease, or unspecified chronic kidney disease: Secondary | ICD-10-CM | POA: Diagnosis not present

## 2018-05-24 DIAGNOSIS — N2581 Secondary hyperparathyroidism of renal origin: Secondary | ICD-10-CM | POA: Diagnosis not present

## 2018-05-24 DIAGNOSIS — D631 Anemia in chronic kidney disease: Secondary | ICD-10-CM | POA: Diagnosis not present

## 2018-05-24 DIAGNOSIS — N184 Chronic kidney disease, stage 4 (severe): Secondary | ICD-10-CM | POA: Diagnosis not present

## 2018-05-24 DIAGNOSIS — E1122 Type 2 diabetes mellitus with diabetic chronic kidney disease: Secondary | ICD-10-CM | POA: Diagnosis not present

## 2018-05-30 DIAGNOSIS — E1151 Type 2 diabetes mellitus with diabetic peripheral angiopathy without gangrene: Secondary | ICD-10-CM | POA: Diagnosis not present

## 2018-05-30 DIAGNOSIS — E7800 Pure hypercholesterolemia, unspecified: Secondary | ICD-10-CM | POA: Diagnosis not present

## 2018-05-30 DIAGNOSIS — I129 Hypertensive chronic kidney disease with stage 1 through stage 4 chronic kidney disease, or unspecified chronic kidney disease: Secondary | ICD-10-CM | POA: Diagnosis not present

## 2018-05-30 DIAGNOSIS — E1121 Type 2 diabetes mellitus with diabetic nephropathy: Secondary | ICD-10-CM | POA: Diagnosis not present

## 2018-05-30 DIAGNOSIS — E1142 Type 2 diabetes mellitus with diabetic polyneuropathy: Secondary | ICD-10-CM | POA: Diagnosis not present

## 2018-05-30 DIAGNOSIS — I10 Essential (primary) hypertension: Secondary | ICD-10-CM | POA: Diagnosis not present

## 2018-05-30 DIAGNOSIS — N184 Chronic kidney disease, stage 4 (severe): Secondary | ICD-10-CM | POA: Diagnosis not present

## 2018-06-03 DIAGNOSIS — R05 Cough: Secondary | ICD-10-CM | POA: Diagnosis not present

## 2018-06-03 DIAGNOSIS — Z1389 Encounter for screening for other disorder: Secondary | ICD-10-CM | POA: Diagnosis not present

## 2018-06-03 DIAGNOSIS — Z0000 Encounter for general adult medical examination without abnormal findings: Secondary | ICD-10-CM | POA: Diagnosis not present

## 2018-06-09 ENCOUNTER — Ambulatory Visit: Payer: Self-pay

## 2018-06-22 DIAGNOSIS — E7800 Pure hypercholesterolemia, unspecified: Secondary | ICD-10-CM | POA: Diagnosis not present

## 2018-06-22 DIAGNOSIS — E1121 Type 2 diabetes mellitus with diabetic nephropathy: Secondary | ICD-10-CM | POA: Diagnosis not present

## 2018-06-22 DIAGNOSIS — I10 Essential (primary) hypertension: Secondary | ICD-10-CM | POA: Diagnosis not present

## 2018-06-22 DIAGNOSIS — I129 Hypertensive chronic kidney disease with stage 1 through stage 4 chronic kidney disease, or unspecified chronic kidney disease: Secondary | ICD-10-CM | POA: Diagnosis not present

## 2018-06-22 DIAGNOSIS — E1151 Type 2 diabetes mellitus with diabetic peripheral angiopathy without gangrene: Secondary | ICD-10-CM | POA: Diagnosis not present

## 2018-06-22 DIAGNOSIS — E1142 Type 2 diabetes mellitus with diabetic polyneuropathy: Secondary | ICD-10-CM | POA: Diagnosis not present

## 2018-06-22 DIAGNOSIS — N184 Chronic kidney disease, stage 4 (severe): Secondary | ICD-10-CM | POA: Diagnosis not present

## 2018-07-13 ENCOUNTER — Other Ambulatory Visit: Payer: Self-pay | Admitting: *Deleted

## 2018-07-13 NOTE — Patient Outreach (Signed)
  [place] [facility]) Care Management Chronic Special Needs Program    07/13/2018  Name: Daniel Warner, DOB: 1940/04/13  MRN: [id number]   Daniel Warner is enrolled in a chronic special needs plan for Diabetes.  Reached Daniel Warner via his mobile number after it was provided by his wife from the home number. Explained role of RNCM and purpose of call. Daniel Warner requested a return call tomorrow at 1:00 pm to complete the initial assessment..  Plan: Will call client tomorrow at 1:00 pm.  Barrington Ellison RN,CCM,CDE [place] Management Coordinator Office Phone [phone] Office Fax [phone]

## 2018-07-14 ENCOUNTER — Other Ambulatory Visit: Payer: Self-pay | Admitting: *Deleted

## 2018-07-14 DIAGNOSIS — I10 Essential (primary) hypertension: Secondary | ICD-10-CM

## 2018-07-14 DIAGNOSIS — E1122 Type 2 diabetes mellitus with diabetic chronic kidney disease: Secondary | ICD-10-CM

## 2018-07-14 DIAGNOSIS — N184 Chronic kidney disease, stage 4 (severe): Secondary | ICD-10-CM

## 2018-07-15 ENCOUNTER — Encounter: Payer: Self-pay | Admitting: *Deleted

## 2018-07-15 NOTE — Patient Outreach (Signed)
[facility]) Care Management Chronic Special Needs Program  07/15/2018  Name: TAQUAN BRALLEY DOB: Mar 10, 1940  MRN: [id number]  Mr. Johari Pinney is enrolled in a chronic special needs plan for Diabetes. Chronic Care Management Coordinator telephoned client to review health risk assessment and to develop individualized care plan.  Introduced the chronic care management program, importance of client participation, and taking their care plan to all provider appointments and inpatient facilities.  Reviewed the transition of care process and possible referral to community care management.  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 He says he still goes to the office everyday and observes Covid 19 safety precautions. He reports that he sees Dr. Felipa Eth every 3 months and Roderic Palau, a pharmacist at Dr. Cathlean Sauer office, every 6 months. He says his medications come prepackaged from [place] and this promotes improved medication taking behavior.  He says he wears the [facility] flash glucose monitoring system and scans the sensor 2-3 times daily. He reports an occasional low blood sugar in the 50s but he says he is always able to self treat. He confirms that he always takes his glimepiride after eating his breakfast and does not go for more than 5 hours without eating. He says most of his meals are from fast food and restaurants and he does no regular exercise. He is not able to state his most recent Hgb A1C value nor his Hbg A1C target.  He says he has a home blood pressure monitor, that  his blood pressure is variable and that is usually higher at providers' offices.  He says his chronic cough continues to bother him and he describes it as a cough that is "deep in his chest- under his breastbone" and usually more frequent with eating. He says he has not been trying the chin  tuck maneuver when swallowing. He says when he drank sodas they would "cut" the phlegm and clear his cough but since he was advised not to drink sodas,  he is coughing more.  He says he has daily bowel movements but his stools are sticky and he is asking if there is anything he can do to make them less sticky. He says he does not have an advance directive and would appreciate help from a social worker in completing them.  Goals Addressed            This Visit's Progress    Advanced Care Planning complete by 2021       HEMOGLOBIN A1C < 8        Client is not meeting diabetes self-management goal of hemoglobin A1C of <8% with last reading of 8.2% on 11/25/17, also reports  Hypoglycemia. Reviewed definition of hypoglycemia, common causes (including mechanism of action of glimepiride) and how to treat. Reviewed rationale and how to perform chin tuck when drinking and eating to assess effect on frequency of chronic cough. Reviewed the importance of a high fiber diet and suggested he try a stool softener to make bowel movements less sticky. Reviewed COVID-19 cause, symptoms, precautions (social distancing, stay at home order, hand washing, wear face covering when unable to maintain or ensure 6 foot social distancing), and symptoms requiring provider notification.  Plan:   Send successful outreach letter with a copy of their individualized care plan to client  Send individual care plan to provider    Send educational material  on hypoglycemia- symptoms and how to treat and how to decrease episodes, suggest stool softener and handout on foods high in fiber to promote less sticky bowel movement    Chronic care management coordination will outreach in:  3 -4 Months  Will refer client to:  Social Work for assistance with completing advance directives.   Kelli Churn RN, CCM, [facility] Care Management [phone]

## 2018-07-18 ENCOUNTER — Other Ambulatory Visit: Payer: Self-pay

## 2018-07-18 NOTE — Patient Outreach (Signed)
[facility]) Care Management  07/18/2018  Daniel Warner [birth year] [id number]   Referral received from [facility], Kelli Churn, to assist patient with completion of Advance Directives.  Unsuccessful outreach today; left voicemail message.  Will attempt to reach again within four business days.  Ronn Melena, BSW Social Worker [phone]

## 2018-07-19 ENCOUNTER — Other Ambulatory Visit: Payer: Self-pay

## 2018-07-19 NOTE — Patient Outreach (Signed)
[facility]) Care Management  07/19/2018  Daniel Warner 1941/01/07 [id number]   Successful outreach regarding social work referral from [facility], Kelli Churn, to assist patient with Advance Directives.  Documentation discussed.  Advance Directive EMMI and packet mailed.  Will follow up within the next two weeks to ensure receipt and review.  Ronn Melena, BSW Social Worker [phone]

## 2018-07-21 DIAGNOSIS — N184 Chronic kidney disease, stage 4 (severe): Secondary | ICD-10-CM | POA: Diagnosis not present

## 2018-07-21 DIAGNOSIS — I10 Essential (primary) hypertension: Secondary | ICD-10-CM | POA: Diagnosis not present

## 2018-07-21 DIAGNOSIS — E1121 Type 2 diabetes mellitus with diabetic nephropathy: Secondary | ICD-10-CM | POA: Diagnosis not present

## 2018-07-21 DIAGNOSIS — E1151 Type 2 diabetes mellitus with diabetic peripheral angiopathy without gangrene: Secondary | ICD-10-CM | POA: Diagnosis not present

## 2018-07-21 DIAGNOSIS — I129 Hypertensive chronic kidney disease with stage 1 through stage 4 chronic kidney disease, or unspecified chronic kidney disease: Secondary | ICD-10-CM | POA: Diagnosis not present

## 2018-07-21 DIAGNOSIS — E1142 Type 2 diabetes mellitus with diabetic polyneuropathy: Secondary | ICD-10-CM | POA: Diagnosis not present

## 2018-07-21 DIAGNOSIS — Z7984 Long term (current) use of oral hypoglycemic drugs: Secondary | ICD-10-CM | POA: Diagnosis not present

## 2018-07-21 DIAGNOSIS — E7800 Pure hypercholesterolemia, unspecified: Secondary | ICD-10-CM | POA: Diagnosis not present

## 2018-07-29 DIAGNOSIS — H401132 Primary open-angle glaucoma, bilateral, moderate stage: Secondary | ICD-10-CM | POA: Diagnosis not present

## 2018-08-01 ENCOUNTER — Other Ambulatory Visit: Payer: Self-pay

## 2018-08-01 NOTE — Patient Outreach (Signed)
[facility]) Care Management  08/01/2018  KENDREW PACI Aug 13, 1940 [id number]  Successful follow up call regarding social work referral for assistance with Advance Directives.   Receipt of EMMI and packet was confirmed.  Denied questions or need for additional assistance at this time.  BSW confirmed that patient has contact information in case questions arise.  Closing case at this time.  Ronn Melena, BSW Social Worker [phone]

## 2018-08-05 DIAGNOSIS — Z79899 Other long term (current) drug therapy: Secondary | ICD-10-CM | POA: Diagnosis not present

## 2018-08-05 DIAGNOSIS — E7800 Pure hypercholesterolemia, unspecified: Secondary | ICD-10-CM | POA: Diagnosis not present

## 2018-08-05 DIAGNOSIS — E1151 Type 2 diabetes mellitus with diabetic peripheral angiopathy without gangrene: Secondary | ICD-10-CM | POA: Diagnosis not present

## 2018-08-05 DIAGNOSIS — L509 Urticaria, unspecified: Secondary | ICD-10-CM | POA: Diagnosis not present

## 2018-08-05 DIAGNOSIS — E1121 Type 2 diabetes mellitus with diabetic nephropathy: Secondary | ICD-10-CM | POA: Diagnosis not present

## 2018-08-05 DIAGNOSIS — R05 Cough: Secondary | ICD-10-CM | POA: Diagnosis not present

## 2018-08-05 DIAGNOSIS — E1142 Type 2 diabetes mellitus with diabetic polyneuropathy: Secondary | ICD-10-CM | POA: Diagnosis not present

## 2018-08-05 DIAGNOSIS — N184 Chronic kidney disease, stage 4 (severe): Secondary | ICD-10-CM | POA: Diagnosis not present

## 2018-08-05 DIAGNOSIS — I10 Essential (primary) hypertension: Secondary | ICD-10-CM | POA: Diagnosis not present

## 2018-08-09 DIAGNOSIS — E1121 Type 2 diabetes mellitus with diabetic nephropathy: Secondary | ICD-10-CM | POA: Diagnosis not present

## 2018-08-09 DIAGNOSIS — E7800 Pure hypercholesterolemia, unspecified: Secondary | ICD-10-CM | POA: Diagnosis not present

## 2018-08-09 DIAGNOSIS — Z79899 Other long term (current) drug therapy: Secondary | ICD-10-CM | POA: Diagnosis not present

## 2018-08-09 DIAGNOSIS — I10 Essential (primary) hypertension: Secondary | ICD-10-CM | POA: Diagnosis not present

## 2018-09-07 DIAGNOSIS — D631 Anemia in chronic kidney disease: Secondary | ICD-10-CM | POA: Diagnosis not present

## 2018-09-07 DIAGNOSIS — E1122 Type 2 diabetes mellitus with diabetic chronic kidney disease: Secondary | ICD-10-CM | POA: Diagnosis not present

## 2018-09-07 DIAGNOSIS — N184 Chronic kidney disease, stage 4 (severe): Secondary | ICD-10-CM | POA: Diagnosis not present

## 2018-09-07 DIAGNOSIS — I129 Hypertensive chronic kidney disease with stage 1 through stage 4 chronic kidney disease, or unspecified chronic kidney disease: Secondary | ICD-10-CM | POA: Diagnosis not present

## 2018-09-09 DIAGNOSIS — E1151 Type 2 diabetes mellitus with diabetic peripheral angiopathy without gangrene: Secondary | ICD-10-CM | POA: Diagnosis not present

## 2018-09-09 DIAGNOSIS — N184 Chronic kidney disease, stage 4 (severe): Secondary | ICD-10-CM | POA: Diagnosis not present

## 2018-09-09 DIAGNOSIS — E1121 Type 2 diabetes mellitus with diabetic nephropathy: Secondary | ICD-10-CM | POA: Diagnosis not present

## 2018-09-09 DIAGNOSIS — I10 Essential (primary) hypertension: Secondary | ICD-10-CM | POA: Diagnosis not present

## 2018-09-09 DIAGNOSIS — E1142 Type 2 diabetes mellitus with diabetic polyneuropathy: Secondary | ICD-10-CM | POA: Diagnosis not present

## 2018-09-09 DIAGNOSIS — E7800 Pure hypercholesterolemia, unspecified: Secondary | ICD-10-CM | POA: Diagnosis not present

## 2018-09-09 DIAGNOSIS — I129 Hypertensive chronic kidney disease with stage 1 through stage 4 chronic kidney disease, or unspecified chronic kidney disease: Secondary | ICD-10-CM | POA: Diagnosis not present

## 2018-10-04 DIAGNOSIS — I10 Essential (primary) hypertension: Secondary | ICD-10-CM | POA: Diagnosis not present

## 2018-10-04 DIAGNOSIS — E1151 Type 2 diabetes mellitus with diabetic peripheral angiopathy without gangrene: Secondary | ICD-10-CM | POA: Diagnosis not present

## 2018-10-04 DIAGNOSIS — N184 Chronic kidney disease, stage 4 (severe): Secondary | ICD-10-CM | POA: Diagnosis not present

## 2018-10-04 DIAGNOSIS — E1121 Type 2 diabetes mellitus with diabetic nephropathy: Secondary | ICD-10-CM | POA: Diagnosis not present

## 2018-10-04 DIAGNOSIS — E7800 Pure hypercholesterolemia, unspecified: Secondary | ICD-10-CM | POA: Diagnosis not present

## 2018-10-04 DIAGNOSIS — E1142 Type 2 diabetes mellitus with diabetic polyneuropathy: Secondary | ICD-10-CM | POA: Diagnosis not present

## 2018-10-04 DIAGNOSIS — I129 Hypertensive chronic kidney disease with stage 1 through stage 4 chronic kidney disease, or unspecified chronic kidney disease: Secondary | ICD-10-CM | POA: Diagnosis not present

## 2018-10-12 DIAGNOSIS — Z23 Encounter for immunization: Secondary | ICD-10-CM | POA: Diagnosis not present

## 2018-10-13 ENCOUNTER — Ambulatory Visit: Payer: Self-pay | Admitting: *Deleted

## 2018-10-14 ENCOUNTER — Ambulatory Visit: Payer: Self-pay | Admitting: *Deleted

## 2018-10-17 ENCOUNTER — Encounter: Payer: Self-pay | Admitting: *Deleted

## 2018-10-17 ENCOUNTER — Other Ambulatory Visit: Payer: Self-pay | Admitting: *Deleted

## 2018-10-17 NOTE — Patient Outreach (Addendum)
[facility]) Care Management Chronic Special Needs Program  10/17/2018  Name: Daniel Warner DOB: [birth year]  MRN: [id number]  Daniel Warner is enrolled in a chronic special needs plan for Diabetes. Reviewed and updated care plan.  Subjective: Daniel Warner states he is doing well. No complaints. He says he spoke to someone else from [place] recently about his health status and reiterated that he is doing well.  He says he completed advance directives. He says his blood sugar is in good control, still wearing the [employer] flash glucose monitoring system.  He says his blood pressure still runs higher at the provider's office. He says no changes have been made to his medication regimen since the last [facility]  RNCM assessment in early July.Marland Kitchen He says sometimes his feet like "bricks" but reports no skin impairment. He says he and his wife have remained safe from [place] 19 infection and continue to follow recommended precautions.   Goals Addressed            This Visit's Progress   . COMPLETED:  Acknowledge receipt of Advanced Directive package   On track    Client states he completed advance directives    . COMPLETED: Advanced Care Planning complete by November 2020      . Client understands the importance of follow-up with providers by attending scheduled visits   On track    Client consistently keeps provider appointments    . Client will verbalize knowledge of self management of Hypertension as evidences by BP reading of 140/90 or less; or as defined by provider       Client states blood pressure still running higher at provider's office    . Maintain timely refills of diabetic medication as prescribed within the year .   On track    Client receives prepackaged medications from [facility]    . Obtain annual  Lipid Profile, LDL-C   On track    Lipid profile completed 08/09/18 with improved total cholesterol from 219 to 161 , improved HDL from  28 to 33, improved LDL from 143 to 112, improved triglycerides from 240 to 80    . Obtain Annual Eye (retinal)  Exam    On track    Completed annul eye exam on 07/29/18- client states no evidence of diabetic retinopathy    . Obtain Annual Foot Exam   On track    Annual diabetic foot exam due in November 2020    . Obtain annual screen for micro albuminuria (urine) , nephropathy (kidney problems)   On track    Microalbuminuria completed on 08/09/18- microalbumin/creatinine ratio improved from 92.5 to 50.8    . Obtain Hemoglobin A1C at least 2 times per year   On track    Hgb A1C completed on 08/09/18 improved from 8.2% (on 11/25/17) to 6.3%    . Visit Primary Care Provider or Endocrinologist at least 2 times per year    On track    Client keeps provider appointments, most recent office visit with primary care provider completed 08/09/18      Assessment: Client is meeting diabetes self-management goal of hemoglobin A1C of <7% with most recent reading of 6.3% on 08/09/18 without reports of hypoglycemia . Client has good understanding of:  COVID-19 cause, symptoms, precautions (social distancing, stay at home order, hand washing, wear face covering when unable to maintain or ensure 6 foot social distancing), and symptoms requiring provider notification. No educational materials needed at this time.  Plan:   Congratulated Daniel Warner on the significant improvement in his Hgb A1C. Send successful outreach letter with a copy of their individualized care plan   Send individual care plan to provider. Chronic care management coordinator will outreach in:  5- 6 Months  Kelli Churn RN, CCM, [place] Management Coordinator [place] Management [phone]    .

## 2018-10-18 ENCOUNTER — Ambulatory Visit: Payer: Self-pay | Admitting: *Deleted

## 2018-10-19 ENCOUNTER — Other Ambulatory Visit: Payer: Self-pay

## 2018-10-19 DIAGNOSIS — I779 Disorder of arteries and arterioles, unspecified: Secondary | ICD-10-CM

## 2018-10-21 ENCOUNTER — Encounter: Payer: Self-pay | Admitting: Podiatry

## 2018-10-21 ENCOUNTER — Telehealth (HOSPITAL_COMMUNITY): Payer: Self-pay

## 2018-10-21 ENCOUNTER — Other Ambulatory Visit: Payer: Self-pay

## 2018-10-21 ENCOUNTER — Ambulatory Visit: Payer: HMO | Admitting: Podiatry

## 2018-10-21 DIAGNOSIS — M79675 Pain in left toe(s): Secondary | ICD-10-CM | POA: Diagnosis not present

## 2018-10-21 DIAGNOSIS — B351 Tinea unguium: Secondary | ICD-10-CM | POA: Diagnosis not present

## 2018-10-21 DIAGNOSIS — M79674 Pain in right toe(s): Secondary | ICD-10-CM | POA: Diagnosis not present

## 2018-10-21 NOTE — Patient Instructions (Signed)
Diabetes Mellitus and Foot Care Foot care is an important part of your health, especially when you have diabetes. Diabetes may cause you to have problems because of poor blood flow (circulation) to your feet and legs, which can cause your skin to:  Become thinner and drier.  Break more easily.  Heal more slowly.  Peel and crack. You may also have nerve damage (neuropathy) in your legs and feet, causing decreased feeling in them. This means that you may not notice minor injuries to your feet that could lead to more serious problems. Noticing and addressing any potential problems early is the best way to prevent future foot problems. How to care for your feet Foot hygiene  Wash your feet daily with warm water and mild soap. Do not use hot water. Then, pat your feet and the areas between your toes until they are completely dry. Do not soak your feet as this can dry your skin.  Trim your toenails straight across. Do not dig under them or around the cuticle. File the edges of your nails with an emery board or nail file.  Apply a moisturizing lotion or petroleum jelly to the skin on your feet and to dry, brittle toenails. Use lotion that does not contain alcohol and is unscented. Do not apply lotion between your toes. Shoes and socks  Wear clean socks or stockings every day. Make sure they are not too tight. Do not wear knee-high stockings since they may decrease blood flow to your legs.  Wear shoes that fit properly and have enough cushioning. Always look in your shoes before you put them on to be sure there are no objects inside.  To break in new shoes, wear them for just a few hours a day. This prevents injuries on your feet. Wounds, scrapes, corns, and calluses  Check your feet daily for blisters, cuts, bruises, sores, and redness. If you cannot see the bottom of your feet, use a mirror or ask someone for help.  Do not cut corns or calluses or try to remove them with medicine.  If you  find a minor scrape, cut, or break in the skin on your feet, keep it and the skin around it clean and dry. You may clean these areas with mild soap and water. Do not clean the area with peroxide, alcohol, or iodine.  If you have a wound, scrape, corn, or callus on your foot, look at it several times a day to make sure it is healing and not infected. Check for: ? Redness, swelling, or pain. ? Fluid or blood. ? Warmth. ? Pus or a bad smell. General instructions  Do not cross your legs. This may decrease blood flow to your feet.  Do not use heating pads or hot water bottles on your feet. They may burn your skin. If you have lost feeling in your feet or legs, you may not know this is happening until it is too late.  Protect your feet from hot and cold by wearing shoes, such as at the beach or on hot pavement.  Schedule a complete foot exam at least once a year (annually) or more often if you have foot problems. If you have foot problems, report any cuts, sores, or bruises to your health care provider immediately. Contact a health care provider if:  You have a medical condition that increases your risk of infection and you have any cuts, sores, or bruises on your feet.  You have an injury that is not   healing.  You have redness on your legs or feet.  You feel burning or tingling in your legs or feet.  You have pain or cramps in your legs and feet.  Your legs or feet are numb.  Your feet always feel cold.  You have pain around a toenail. Get help right away if:  You have a wound, scrape, corn, or callus on your foot and: ? You have pain, swelling, or redness that gets worse. ? You have fluid or blood coming from the wound, scrape, corn, or callus. ? Your wound, scrape, corn, or callus feels warm to the touch. ? You have pus or a bad smell coming from the wound, scrape, corn, or callus. ? You have a fever. ? You have a red line going up your leg. Summary  Check your feet every day  for cuts, sores, red spots, swelling, and blisters.  Moisturize feet and legs daily.  Wear shoes that fit properly and have enough cushioning.  If you have foot problems, report any cuts, sores, or bruises to your health care provider immediately.  Schedule a complete foot exam at least once a year (annually) or more often if you have foot problems. This information is not intended to replace advice given to you by your health care provider. Make sure you discuss any questions you have with your health care provider. Document Released: 12/27/1999 Document Revised: 02/10/2017 Document Reviewed: 01/31/2016 Elsevier Patient Education  2020 Elsevier Inc.   Onychomycosis/Fungal Toenails  WHAT IS IT? An infection that lies within the keratin of your nail plate that is caused by a fungus.  WHY ME? Fungal infections affect all ages, sexes, races, and creeds.  There may be many factors that predispose you to a fungal infection such as age, coexisting medical conditions such as diabetes, or an autoimmune disease; stress, medications, fatigue, genetics, etc.  Bottom line: fungus thrives in a warm, moist environment and your shoes offer such a location.  IS IT CONTAGIOUS? Theoretically, yes.  You do not want to share shoes, nail clippers or files with someone who has fungal toenails.  Walking around barefoot in the same room or sleeping in the same bed is unlikely to transfer the organism.  It is important to realize, however, that fungus can spread easily from one nail to the next on the same foot.  HOW DO WE TREAT THIS?  There are several ways to treat this condition.  Treatment may depend on many factors such as age, medications, pregnancy, liver and kidney conditions, etc.  It is best to ask your doctor which options are available to you.  1. No treatment.   Unlike many other medical concerns, you can live with this condition.  However for many people this can be a painful condition and may lead to  ingrown toenails or a bacterial infection.  It is recommended that you keep the nails cut short to help reduce the amount of fungal nail. 2. Topical treatment.  These range from herbal remedies to prescription strength nail lacquers.  About 40-50% effective, topicals require twice daily application for approximately 9 to 12 months or until an entirely new nail has grown out.  The most effective topicals are medical grade medications available through physicians offices. 3. Oral antifungal medications.  With an 80-90% cure rate, the most common oral medication requires 3 to 4 months of therapy and stays in your system for a year as the new nail grows out.  Oral antifungal medications do require   blood work to make sure it is a safe drug for you.  A liver function panel will be performed prior to starting the medication and after the first month of treatment.  It is important to have the blood work performed to avoid any harmful side effects.  In general, this medication safe but blood work is required. 4. Laser Therapy.  This treatment is performed by applying a specialized laser to the affected nail plate.  This therapy is noninvasive, fast, and non-painful.  It is not covered by insurance and is therefore, out of pocket.  The results have been very good with a 80-95% cure rate.  The [facility] is the only practice in the area to offer this therapy. 5. Permanent Nail Avulsion.  Removing the entire nail so that a new nail will not grow back. 

## 2018-10-21 NOTE — Telephone Encounter (Signed)

## 2018-10-24 ENCOUNTER — Ambulatory Visit (HOSPITAL_COMMUNITY)
Admission: RE | Admit: 2018-10-24 | Discharge: 2018-10-24 | Disposition: A | Discharge status: Home / Self Care | Payer: HMO | Source: Ambulatory Visit | Attending: Family | Admitting: Family

## 2018-10-24 ENCOUNTER — Ambulatory Visit (INDEPENDENT_AMBULATORY_CARE_PROVIDER_SITE_OTHER): Payer: HMO | Admitting: Family

## 2018-10-24 ENCOUNTER — Encounter: Payer: Self-pay | Admitting: Family

## 2018-10-24 ENCOUNTER — Ambulatory Visit (INDEPENDENT_AMBULATORY_CARE_PROVIDER_SITE_OTHER)
Admission: RE | Admit: 2018-10-24 | Discharge: 2018-10-24 | Disposition: A | Discharge status: Home / Self Care | Payer: HMO | Source: Ambulatory Visit | Attending: Family | Admitting: Family

## 2018-10-24 ENCOUNTER — Other Ambulatory Visit: Payer: Self-pay | Admitting: Family

## 2018-10-24 ENCOUNTER — Other Ambulatory Visit: Payer: Self-pay

## 2018-10-24 VITALS — BP 180/90 | HR 56 | Temp 96.5°F | Resp 18 | Ht 66.0 in | Wt 235.0 lb

## 2018-10-24 DIAGNOSIS — Z95820 Peripheral vascular angioplasty status with implants and grafts: Secondary | ICD-10-CM

## 2018-10-24 DIAGNOSIS — I779 Disorder of arteries and arterioles, unspecified: Secondary | ICD-10-CM

## 2018-10-24 DIAGNOSIS — I6523 Occlusion and stenosis of bilateral carotid arteries: Secondary | ICD-10-CM

## 2018-10-24 DIAGNOSIS — I739 Peripheral vascular disease, unspecified: Secondary | ICD-10-CM

## 2018-10-24 NOTE — Progress Notes (Signed)
[facility] [place]   CC: Follow up peripheral artery occlusive disease  History of Present Illness Daniel Warner is a 78 y.o. male who is s/p right superficial femoral artery angioplasty and stenting x 2 on 10/16/14 by Dr. Donnetta Hutching. He presented to the emergency department on 10/15/2014 with progressive rest pain in his right foot and superficial tissue loss on the toes of his right foot. He underwent aortogram with runoff by Dr. Oren Binet found to have high-grade stenosis in his right superficial femoral artery. He underwent angioplasty and stenting of 2 different segments. He had an excellent initial result.   He can walks as far as he wants w/o claudication sx's.  He is seeing a nephrologist, pt states his kidney function is at 30%, no serum creatinine result on file since October 2016; was 1.38 at that time with GFR of 57.   Pt states at home his systolic blood pressure [id number], is elevated now.  Diabetic: Yes, 9.4 A1C in October, 2016 (review of records), pt does not recall recent A1C. He is tracking his glucose  Tobacco use: non-smoker  Pt meds include: Statin :Yes Betablocker: No ASA: No Other anticoagulants/antiplatelets: Plavix    Past Medical History:  Diagnosis Date  . Benign hypertension with chronic kidney disease, stage III   . Carotid artery disease ([place])   . CKD (chronic kidney disease), stage III   . Diabetes mellitus type 2 with peripheral artery disease ([place])   . Diabetes mellitus without complication ([place])   . Discolored skin    Right leg, thinks it's from a bag that has rubbed against his leg for so long. Doppler 12/2010  . GERD (gastroesophageal reflux disease)   . Hypercholesterolemia   . Hyperlipidemia   . Numbness of toes    In the morning  . Obesity   . Peripheral arterial disease ([place])     Social History Social History   Tobacco Use  . Smoking status: Never Smoker  . Smokeless tobacco: Never Used  Substance  Use Topics  . Alcohol use: No  . Drug use: No    Family History Family History  Problem Relation Age of Onset  . Stroke Father   . Irritable bowel syndrome Mother   . Heart disease Paternal Aunt        CABG  . Heart disease Cousin        CABG    Past Surgical History:  Procedure Laterality Date  . PERIPHERAL VASCULAR CATHETERIZATION N/A 10/16/2014   Procedure: Abdominal Aortogram w/Lower Extremity;  Surgeon: Serafina Mitchell, MD;  Location: [facility];  Service: Cardiovascular;  Laterality: N/A;  . TONSILLECTOMY    . WISDOM TOOTH EXTRACTION      No Known Allergies  Current Outpatient Medications  Medication Sig Dispense Refill  . ALLERGY RELIEF 10 MG tablet 1 TABLET BY MOUTH ONCE DAILY.    Marland Kitchen amLODipine (NORVASC) 2.5 MG tablet   1  . amLODipine (NORVASC) 5 MG tablet Take 5 mg by mouth daily.    Marland Kitchen atorvastatin (LIPITOR) 40 MG tablet TAKE 1 TABELT BY MOUTH DAILY IN THE MORNING FOR CHOLESTEROL    . calcitRIOL (ROCALTROL) 0.25 MCG capsule Take 0.25 mcg by mouth daily.  0  . CARAFATE 1 GM/10ML suspension SHAKE LQ AND TK 10 ML PO QD B THE EVE MEAL OES  2  . clopidogrel (PLAVIX) 75 MG tablet Take 1 tablet (75 mg total) by mouth daily with breakfast. 30 tablet 1  .  Continuous Blood Gluc Receiver (FREESTYLE LIBRE 14 DAY READER) DEVI     . Continuous Blood Gluc Sensor (FREESTYLE LIBRE 14 DAY SENSOR) MISC     . famotidine (PEPCID) 20 MG tablet TAKE 2 TABLETS ($RemoveBe'40MG'sleBkqJhe$ ) BY MOUTH AT BEDTIME FOR HEARTBURN    . furosemide (LASIX) 40 MG tablet Take 40 mg by mouth daily.  0  . glimepiride (AMARYL) 4 MG tablet Take 4 mg by mouth daily with breakfast.    . telmisartan (MICARDIS) 80 MG tablet     . TRULICITY 1.61 WR/6.0AV SOPN     . atorvastatin (LIPITOR) 20 MG tablet Take 20 mg by mouth daily.  11   No current facility-administered medications for this visit.     ROS: See HPI for pertinent positives and negatives.   Physical Examination  Vitals:   10/24/18 1437  BP: (!) 180/90   Pulse: (!) 56  Resp: 18  Temp: (!) 96.5 F (35.8 C)  TempSrc: Temporal  SpO2: 99%  Weight: 235 lb (106.6 kg)  Height: $Remove'5\' 6"'PZRUEMf$  (1.676 m)   Body mass index is 37.93 kg/m.  General: A&O x 3, WDWN, obese male accompanied by his wife. Gait: normal HENT: No gross abnormalities.  Eyes: PERRLA. Pulmonary: Respirations are non labored, CTAB, good air movement in all fields Cardiac: regular rhythm, no detected murmur.         Carotid Bruits Right Left   Negative Positive   Radial pulses are 2+ palpable bilaterally   Adominal aortic pulse is not palpable                         VASCULAR EXAM: Extremities without ischemic changes, without Gangrene; without open wounds.                                                                                                          LE Pulses Right Left       FEMORAL  2+ palpable  2+ palpable        POPLITEAL  not palpable   not palpable       POSTERIOR TIBIAL  not palpable   not palpable        DORSALIS PEDIS      ANTERIOR TIBIAL not palpable  not palpable    Abdomen: soft, NT, no palpable masses. Skin: no rashes, no cellulitis, no ulcers noted. Musculoskeletal: no muscle wasting or atrophy.  Neurologic: A&O X 3; appropriate affect, Sensation is normal; MOTOR FUNCTION:  moving all extremities equally, motor strength 5/5 throughout. Speech is fluent/normal. CN 2-12 intact. Psychiatric: Thought content is normal, mood appropriate for clinical situation.     DATA  Right LEArterialDuplex (10-24-18): RIGHT     PSV cm/sRatioStenosis       Waveform Comments              +----------+--------+-----+---------------+---------+---------------------+ CFA [id number]                         biphasic                       +----------+--------+-----+---------------+---------+---------------------+  DFA       146                         biphasic                        +----------+--------+-----+---------------+---------+---------------------+ SFA Prox  211                         biphasic see stent             +----------+--------+-----+---------------+---------+---------------------+ SFA Mid   239          30-49% stenosisbiphasic                       +----------+--------+-----+---------------+---------+---------------------+ SFA Distal105                         biphasic stent not visualized. +----------+--------+-----+---------------+---------+---------------------+ POP Prox  67                          triphasic                      +----------+--------+-----+---------------+---------+---------------------+ POP Distal80                          biphasic                       +----------+--------+-----+---------------+---------+---------------------+  A focal velocity elevation of 239 cm/s was obtained at mid SFA with post stenotic turbulence with a VR of 1.4. Findings are characteristic of 30-49% stenosis.   Right Stent(s): +---------------+--------+--------+---------+--------+ proximal SFA   PSV cm/sStenosisWaveform Comments +---------------+--------+--------+---------+--------+ Prox to Stent  206             triphasic         +---------------+--------+--------+---------+--------+ Proximal Stent 223             triphasic         +---------------+--------+--------+---------+--------+ Mid Stent      122             biphasic          +---------------+--------+--------+---------+--------+ Distal Stent   166             triphasic         +---------------+--------+--------+---------+--------+ Distal to Stent157             biphasic          +---------------+--------+--------+---------+--------+ Summary: Right: Patent stents in the right SFA. Distal stent not actually visualized. 30-49% stenosis in the mid SFA.  No significant change since the exams on 01-24-16, 07-24-16, and  04-26-17.   ABI Findings (10-24-18): +---------+------------------+-----+---------+--------+ Right    Rt Pressure (mmHg)IndexWaveform Comment  +---------+------------------+-----+---------+--------+ Brachial 211                                      +---------+------------------+-----+---------+--------+ PTA      203               0.94 triphasic         +---------+------------------+-----+---------+--------+ DP       201               0.93 triphasic         +---------+------------------+-----+---------+--------+  Great Toe124               0.58                   +---------+------------------+-----+---------+--------+  +---------+------------------+-----+---------+-------+ Left     Lt Pressure (mmHg)IndexWaveform Comment +---------+------------------+-----+---------+-------+ Brachial 215                                     +---------+------------------+-----+---------+-------+ PTA      202               0.94 triphasic        +---------+------------------+-----+---------+-------+ DP       203               0.94 triphasic        +---------+------------------+-----+---------+-------+ Great Toe136               0.63                  +---------+------------------+-----+---------+-------+  +-------+-----------+-----------+------------+------------+ ABI/TBIToday's ABIToday's TBIPrevious ABIPrevious TBI +-------+-----------+-----------+------------+------------+ Right  0.94       0.58       0.88        0.55         +-------+-----------+-----------+------------+------------+ Left   0.94       0.63       0.96        0.63         +-------+-----------+-----------+------------+------------+  Bilateral ABIs and TBIs appear essentially unchanged, improved waveforms quality bilaterally.   Summary: Right: Resting right ankle-brachial index indicates mild right lower extremity arterial disease. The right toe-brachial  index is abnormal. RT great toe pressure = 124 mmHg.  Left: Resting left ankle-brachial index indicates mild left lower extremity arterial disease. The left toe-brachial index is abnormal. LT Great toe pressure = 136 mmHg.    CarotidDuplex(01-24-16): <40% bilateral ICA stenosis.  Bilateral vertebral artery flow is antegrade.  Bilateral subclavian artery waveforms are normal.  No significant change from 2013 exam.    ASSESSMENT: Daniel Warner is a 78 y.o. male who is s/p right SFA stenting x 2 on 10/16/14. He can walks as far as he wants w/o claudication sx's.   He has no signs of ischemia in his feet/legs.  ABI's today show mild disease bilaterally with all triphasic waveforms, stable compared to the previous ABI.  Right LE arterial duplex shows no significant stenosis, stable exams since January 2018.  He has a left carotid bruit. Carotid duplex on 01-24-16 showed minimal stenosis in the bilateral ICA, normal bilateral CCA, normal vertebral and subclavian arteries.  His atherosclerotic risk factors include DM and obesity. Fortunately he has never used tobacco. He takes a statin and Plavix.   Pt states at home his systolic blood pressure [id number], is elevated now. He sees a nephrologist.    PLAN:  Based on the patient's vascular studies and examination, pt will return to clinic in 1 yearwith right LE arterial duplex, ABI's, and carotid duplex. I advised him to notify us if he develops concerns re the circulation in his feet of legs.    Walk at least a total of 30 minutes daily in a safe environment.   I discussed in depth with the patient the nature of atherosclerosis, and emphasized the importance of maximal medical management including strict control of blood pressure, blood glucose, and lipid levels, obtaining regular exercise, and continued cessation of  smoking.  The patient is aware that without maximal medical management the underlying atherosclerotic  disease process will progress, limiting the benefit of any interventions.  The patient was given information about PAD including signs, symptoms, treatment, what symptoms should prompt the patient to seek immediate medical care, and risk reduction measures to take.  Clemon Chambers, RN, MSN, FNP-C [facility] Phone: [phone]  Clinic MD: Carlis Abbott on call  10/24/18 3:07 PM

## 2018-10-24 NOTE — Patient Instructions (Signed)
Peripheral Vascular Disease  Peripheral vascular disease (PVD) is a disease of the blood vessels that are not part of your heart and brain. A simple term for PVD is poor circulation. In most cases, PVD narrows the blood vessels that carry blood from your heart to the rest of your body. This can reduce the supply of blood to your arms, legs, and internal organs, like your stomach or kidneys. However, PVD most often affects a person's lower legs and feet. Without treatment, PVD tends to get worse. PVD can also lead to acute ischemic limb. This is when an arm or leg suddenly cannot get enough blood. This is a medical emergency. Follow these instructions at home: Lifestyle  Do not use any products that contain nicotine or tobacco, such as cigarettes and e-cigarettes. If you need help quitting, ask your doctor.  Lose weight if you are overweight. Or, stay at a healthy weight as told by your doctor.  Eat a diet that is low in fat and cholesterol. If you need help, ask your doctor.  Exercise regularly. Ask your doctor for activities that are right for you. General instructions  Take over-the-counter and prescription medicines only as told by your doctor.  Take good care of your feet: ? Wear comfortable shoes that fit well. ? Check your feet often for any cuts or sores.  Keep all follow-up visits as told by your doctor This is important. Contact a doctor if:  You have cramps in your legs when you walk.  You have leg pain when you are at rest.  You have coldness in a leg or foot.  Your skin changes.  You are unable to get or have an erection (erectile dysfunction).  You have cuts or sores on your feet that do not heal. Get help right away if:  Your arm or leg turns cold, numb, and blue.  Your arms or legs become red, warm, swollen, painful, or numb.  You have chest pain.  You have trouble breathing.  You suddenly have weakness in your face, arm, or leg.  You become very  confused or you cannot speak.  You suddenly have a very bad headache.  You suddenly cannot see. Summary  Peripheral vascular disease (PVD) is a disease of the blood vessels.  A simple term for PVD is poor circulation. Without treatment, PVD tends to get worse.  Treatment may include exercise, low fat and low cholesterol diet, and quitting smoking. This information is not intended to replace advice given to you by your health care provider. Make sure you discuss any questions you have with your health care provider. Document Released: 03/25/2009 Document Revised: 12/11/2016 Document Reviewed: 02/06/2016 Elsevier Patient Education  2020 [employer]  

## 2018-10-24 NOTE — Progress Notes (Signed)
Subjective: Daniel Warner Warner is seen today for follow up painful, elongated, thickened toenails 1-5 b/l feet that he cannot cut. Pain interferes with daily activities. Aggravating factor includes wearing enclosed shoe gear and relieved with periodic debridement.  Current Outpatient Medications on File Prior to Visit  Medication Sig  . ALLERGY RELIEF 10 MG tablet 1 TABLET BY MOUTH ONCE DAILY.  Marland Kitchen amLODipine (NORVASC) 2.5 MG tablet   . amLODipine (NORVASC) 5 MG tablet Take 5 mg by mouth daily.  Marland Kitchen atorvastatin (LIPITOR) 20 MG tablet Take 20 mg by mouth daily.  Marland Kitchen atorvastatin (LIPITOR) 40 MG tablet TAKE 1 TABELT BY MOUTH DAILY IN THE MORNING FOR CHOLESTEROL  . calcitRIOL (ROCALTROL) 0.25 MCG capsule Take 0.25 mcg by mouth daily.  Marland Kitchen CARAFATE 1 GM/10ML suspension SHAKE LQ AND TK 10 ML PO QD B THE EVE MEAL OES  . clopidogrel (PLAVIX) 75 MG tablet Take 1 tablet (75 mg total) by mouth daily with breakfast.  . Continuous Blood Gluc Receiver (FREESTYLE LIBRE 14 DAY READER) DEVI   . Continuous Blood Gluc Sensor (FREESTYLE LIBRE 14 DAY SENSOR) MISC   . famotidine (PEPCID) 20 MG tablet TAKE 2 TABLETS ($RemoveBe'40MG'WFHqffoqI$ ) BY MOUTH AT BEDTIME FOR HEARTBURN  . furosemide (LASIX) 40 MG tablet Take 40 mg by mouth daily.  Marland Kitchen glimepiride (AMARYL) 4 MG tablet Take 4 mg by mouth daily with breakfast.  . telmisartan (MICARDIS) 80 MG tablet   . TRULICITY 1.61 WR/6.0AV SOPN    No current facility-administered medications on file prior to visit.      No Known Allergies   Objective:  Vascular Examination: Capillary refill time immediate x 10 digits.  Dorsalis pedis present b/l.  Posterior tibial pulses faintly palpable b/l.  Digital hair absent b/l.  Skin temperature gradient WNL b/l.   Dermatological Examination: Skin with normal turgor, texture and tone b/l.  Toenails 1-5 b/l discolored, thick, dystrophic with subungual debris and pain with palpation to nailbeds due to thickness of  nails.  Musculoskeletal: Muscle strength 5/5 to all LE muscle groups b/l.   No gross bony deformities b/l.  No pain, crepitus or joint limitation noted with ROM.   Neurological Examination: Protective sensation intact 5/5 b/l with 10 gram monofilament.  Epicritic sensation present bilaterally.  Vibratory sensation intact bilaterally.   Assessment: Painful onychomycosis toenails 1-5 b/l   Plan: 1. Toenails 1-5 b/l were debrided in length and girth without iatrogenic bleeding. 2. Patient to continue soft, supportive shoe gear 3. Patient to report any pedal injuries to medical professional immediately. 4. Follow up 3 months.  5. Patient/POA to call should there be a concern in the interim.

## 2018-11-09 ENCOUNTER — Other Ambulatory Visit: Payer: Self-pay | Admitting: Geriatric Medicine

## 2018-11-09 DIAGNOSIS — R05 Cough: Secondary | ICD-10-CM | POA: Diagnosis not present

## 2018-11-09 DIAGNOSIS — R1319 Other dysphagia: Secondary | ICD-10-CM

## 2018-11-09 DIAGNOSIS — E1121 Type 2 diabetes mellitus with diabetic nephropathy: Secondary | ICD-10-CM | POA: Diagnosis not present

## 2018-11-09 DIAGNOSIS — E1151 Type 2 diabetes mellitus with diabetic peripheral angiopathy without gangrene: Secondary | ICD-10-CM | POA: Diagnosis not present

## 2018-11-09 DIAGNOSIS — R1310 Dysphagia, unspecified: Secondary | ICD-10-CM

## 2018-11-09 DIAGNOSIS — N184 Chronic kidney disease, stage 4 (severe): Secondary | ICD-10-CM | POA: Diagnosis not present

## 2018-11-09 DIAGNOSIS — I129 Hypertensive chronic kidney disease with stage 1 through stage 4 chronic kidney disease, or unspecified chronic kidney disease: Secondary | ICD-10-CM | POA: Diagnosis not present

## 2018-11-09 DIAGNOSIS — E1142 Type 2 diabetes mellitus with diabetic polyneuropathy: Secondary | ICD-10-CM | POA: Diagnosis not present

## 2018-11-11 DIAGNOSIS — E1142 Type 2 diabetes mellitus with diabetic polyneuropathy: Secondary | ICD-10-CM | POA: Diagnosis not present

## 2018-11-11 DIAGNOSIS — E7800 Pure hypercholesterolemia, unspecified: Secondary | ICD-10-CM | POA: Diagnosis not present

## 2018-11-11 DIAGNOSIS — I129 Hypertensive chronic kidney disease with stage 1 through stage 4 chronic kidney disease, or unspecified chronic kidney disease: Secondary | ICD-10-CM | POA: Diagnosis not present

## 2018-11-11 DIAGNOSIS — N184 Chronic kidney disease, stage 4 (severe): Secondary | ICD-10-CM | POA: Diagnosis not present

## 2018-11-11 DIAGNOSIS — I10 Essential (primary) hypertension: Secondary | ICD-10-CM | POA: Diagnosis not present

## 2018-11-11 DIAGNOSIS — E1121 Type 2 diabetes mellitus with diabetic nephropathy: Secondary | ICD-10-CM | POA: Diagnosis not present

## 2018-11-11 DIAGNOSIS — E1151 Type 2 diabetes mellitus with diabetic peripheral angiopathy without gangrene: Secondary | ICD-10-CM | POA: Diagnosis not present

## 2018-11-16 ENCOUNTER — Other Ambulatory Visit: Payer: HMO

## 2018-11-25 ENCOUNTER — Ambulatory Visit
Admission: RE | Admit: 2018-11-25 | Discharge: 2018-11-25 | Disposition: A | Discharge status: Home / Self Care | Payer: HMO | Source: Ambulatory Visit | Attending: Geriatric Medicine | Admitting: Geriatric Medicine

## 2018-11-25 DIAGNOSIS — R1310 Dysphagia, unspecified: Secondary | ICD-10-CM

## 2018-11-25 DIAGNOSIS — R1319 Other dysphagia: Secondary | ICD-10-CM

## 2018-12-01 DIAGNOSIS — E1121 Type 2 diabetes mellitus with diabetic nephropathy: Secondary | ICD-10-CM | POA: Diagnosis not present

## 2018-12-01 DIAGNOSIS — E1142 Type 2 diabetes mellitus with diabetic polyneuropathy: Secondary | ICD-10-CM | POA: Diagnosis not present

## 2018-12-01 DIAGNOSIS — N184 Chronic kidney disease, stage 4 (severe): Secondary | ICD-10-CM | POA: Diagnosis not present

## 2018-12-01 DIAGNOSIS — I129 Hypertensive chronic kidney disease with stage 1 through stage 4 chronic kidney disease, or unspecified chronic kidney disease: Secondary | ICD-10-CM | POA: Diagnosis not present

## 2018-12-12 ENCOUNTER — Ambulatory Visit
Admission: RE | Admit: 2018-12-12 | Discharge: 2018-12-12 | Disposition: A | Discharge status: Home / Self Care | Payer: HMO | Source: Ambulatory Visit | Attending: Geriatric Medicine | Admitting: Geriatric Medicine

## 2018-12-12 DIAGNOSIS — E1121 Type 2 diabetes mellitus with diabetic nephropathy: Secondary | ICD-10-CM | POA: Diagnosis not present

## 2018-12-12 DIAGNOSIS — E1151 Type 2 diabetes mellitus with diabetic peripheral angiopathy without gangrene: Secondary | ICD-10-CM | POA: Diagnosis not present

## 2018-12-12 DIAGNOSIS — E1142 Type 2 diabetes mellitus with diabetic polyneuropathy: Secondary | ICD-10-CM | POA: Diagnosis not present

## 2018-12-12 DIAGNOSIS — I10 Essential (primary) hypertension: Secondary | ICD-10-CM | POA: Diagnosis not present

## 2018-12-12 DIAGNOSIS — I129 Hypertensive chronic kidney disease with stage 1 through stage 4 chronic kidney disease, or unspecified chronic kidney disease: Secondary | ICD-10-CM | POA: Diagnosis not present

## 2018-12-12 DIAGNOSIS — E7800 Pure hypercholesterolemia, unspecified: Secondary | ICD-10-CM | POA: Diagnosis not present

## 2018-12-12 DIAGNOSIS — N184 Chronic kidney disease, stage 4 (severe): Secondary | ICD-10-CM | POA: Diagnosis not present

## 2018-12-12 DIAGNOSIS — K449 Diaphragmatic hernia without obstruction or gangrene: Secondary | ICD-10-CM | POA: Diagnosis not present

## 2018-12-20 DIAGNOSIS — N2581 Secondary hyperparathyroidism of renal origin: Secondary | ICD-10-CM | POA: Diagnosis not present

## 2018-12-20 DIAGNOSIS — N189 Chronic kidney disease, unspecified: Secondary | ICD-10-CM | POA: Diagnosis not present

## 2018-12-20 DIAGNOSIS — D631 Anemia in chronic kidney disease: Secondary | ICD-10-CM | POA: Diagnosis not present

## 2018-12-20 DIAGNOSIS — E1122 Type 2 diabetes mellitus with diabetic chronic kidney disease: Secondary | ICD-10-CM | POA: Diagnosis not present

## 2018-12-20 DIAGNOSIS — I129 Hypertensive chronic kidney disease with stage 1 through stage 4 chronic kidney disease, or unspecified chronic kidney disease: Secondary | ICD-10-CM | POA: Diagnosis not present

## 2018-12-20 DIAGNOSIS — N184 Chronic kidney disease, stage 4 (severe): Secondary | ICD-10-CM | POA: Diagnosis not present

## 2019-01-10 DIAGNOSIS — I129 Hypertensive chronic kidney disease with stage 1 through stage 4 chronic kidney disease, or unspecified chronic kidney disease: Secondary | ICD-10-CM | POA: Diagnosis not present

## 2019-01-10 DIAGNOSIS — I10 Essential (primary) hypertension: Secondary | ICD-10-CM | POA: Diagnosis not present

## 2019-01-10 DIAGNOSIS — E1121 Type 2 diabetes mellitus with diabetic nephropathy: Secondary | ICD-10-CM | POA: Diagnosis not present

## 2019-01-10 DIAGNOSIS — N184 Chronic kidney disease, stage 4 (severe): Secondary | ICD-10-CM | POA: Diagnosis not present

## 2019-01-10 DIAGNOSIS — E7800 Pure hypercholesterolemia, unspecified: Secondary | ICD-10-CM | POA: Diagnosis not present

## 2019-01-10 DIAGNOSIS — E1142 Type 2 diabetes mellitus with diabetic polyneuropathy: Secondary | ICD-10-CM | POA: Diagnosis not present

## 2019-01-10 DIAGNOSIS — E1151 Type 2 diabetes mellitus with diabetic peripheral angiopathy without gangrene: Secondary | ICD-10-CM | POA: Diagnosis not present

## 2019-01-20 DIAGNOSIS — E1121 Type 2 diabetes mellitus with diabetic nephropathy: Secondary | ICD-10-CM | POA: Diagnosis not present

## 2019-01-20 DIAGNOSIS — N184 Chronic kidney disease, stage 4 (severe): Secondary | ICD-10-CM | POA: Diagnosis not present

## 2019-01-20 DIAGNOSIS — E7800 Pure hypercholesterolemia, unspecified: Secondary | ICD-10-CM | POA: Diagnosis not present

## 2019-01-20 DIAGNOSIS — E1142 Type 2 diabetes mellitus with diabetic polyneuropathy: Secondary | ICD-10-CM | POA: Diagnosis not present

## 2019-01-20 DIAGNOSIS — I129 Hypertensive chronic kidney disease with stage 1 through stage 4 chronic kidney disease, or unspecified chronic kidney disease: Secondary | ICD-10-CM | POA: Diagnosis not present

## 2019-01-20 DIAGNOSIS — E1151 Type 2 diabetes mellitus with diabetic peripheral angiopathy without gangrene: Secondary | ICD-10-CM | POA: Diagnosis not present

## 2019-01-20 DIAGNOSIS — I10 Essential (primary) hypertension: Secondary | ICD-10-CM | POA: Diagnosis not present

## 2019-01-23 DIAGNOSIS — R1310 Dysphagia, unspecified: Secondary | ICD-10-CM | POA: Diagnosis not present

## 2019-01-23 DIAGNOSIS — K5900 Constipation, unspecified: Secondary | ICD-10-CM | POA: Diagnosis not present

## 2019-01-23 DIAGNOSIS — K219 Gastro-esophageal reflux disease without esophagitis: Secondary | ICD-10-CM | POA: Diagnosis not present

## 2019-01-23 DIAGNOSIS — K222 Esophageal obstruction: Secondary | ICD-10-CM | POA: Diagnosis not present

## 2019-01-27 ENCOUNTER — Ambulatory Visit: Payer: HMO | Admitting: Podiatry

## 2019-01-27 ENCOUNTER — Encounter: Payer: Self-pay | Admitting: Podiatry

## 2019-01-27 ENCOUNTER — Other Ambulatory Visit: Payer: Self-pay

## 2019-01-27 DIAGNOSIS — B351 Tinea unguium: Secondary | ICD-10-CM | POA: Diagnosis not present

## 2019-01-27 DIAGNOSIS — M79674 Pain in right toe(s): Secondary | ICD-10-CM

## 2019-01-27 DIAGNOSIS — M79675 Pain in left toe(s): Secondary | ICD-10-CM | POA: Diagnosis not present

## 2019-01-27 DIAGNOSIS — L600 Ingrowing nail: Secondary | ICD-10-CM

## 2019-01-27 NOTE — Patient Instructions (Addendum)
Diabetes Mellitus and Foot Care Foot care is an important part of your health, especially when you have diabetes. Diabetes may cause you to have problems because of poor blood flow (circulation) to your feet and legs, which can cause your skin to:  Become thinner and drier.  Break more easily.  Heal more slowly.  Peel and crack. You may also have nerve damage (neuropathy) in your legs and feet, causing decreased feeling in them. This means that you may not notice minor injuries to your feet that could lead to more serious problems. Noticing and addressing any potential problems early is the best way to prevent future foot problems. How to care for your feet Foot hygiene  Wash your feet daily with warm water and mild soap. Do not use hot water. Then, pat your feet and the areas between your toes until they are completely dry. Do not soak your feet as this can dry your skin.  Trim your toenails straight across. Do not dig under them or around the cuticle. File the edges of your nails with an emery board or nail file.  Apply a moisturizing lotion or petroleum jelly to the skin on your feet and to dry, brittle toenails. Use lotion that does not contain alcohol and is unscented. Do not apply lotion between your toes. Shoes and socks  Wear clean socks or stockings every day. Make sure they are not too tight. Do not wear knee-high stockings since they may decrease blood flow to your legs.  Wear shoes that fit properly and have enough cushioning. Always look in your shoes before you put them on to be sure there are no objects inside.  To break in new shoes, wear them for just a few hours a day. This prevents injuries on your feet. Wounds, scrapes, corns, and calluses  Check your feet daily for blisters, cuts, bruises, sores, and redness. If you cannot see the bottom of your feet, use a mirror or ask someone for help.  Do not cut corns or calluses or try to remove them with medicine.  If you  find a minor scrape, cut, or break in the skin on your feet, keep it and the skin around it clean and dry. You may clean these areas with mild soap and water. Do not clean the area with peroxide, alcohol, or iodine.  If you have a wound, scrape, corn, or callus on your foot, look at it several times a day to make sure it is healing and not infected. Check for: ? Redness, swelling, or pain. ? Fluid or blood. ? Warmth. ? Pus or a bad smell. General instructions  Do not cross your legs. This may decrease blood flow to your feet.  Do not use heating pads or hot water bottles on your feet. They may burn your skin. If you have lost feeling in your feet or legs, you may not know this is happening until it is too late.  Protect your feet from hot and cold by wearing shoes, such as at the beach or on hot pavement.  Schedule a complete foot exam at least once a year (annually) or more often if you have foot problems. If you have foot problems, report any cuts, sores, or bruises to your health care provider immediately. Contact a health care provider if:  You have a medical condition that increases your risk of infection and you have any cuts, sores, or bruises on your feet.  You have an injury that is not   healing.  You have redness on your legs or feet.  You feel burning or tingling in your legs or feet.  You have pain or cramps in your legs and feet.  Your legs or feet are numb.  Your feet always feel cold.  You have pain around a toenail. Get help right away if:  You have a wound, scrape, corn, or callus on your foot and: ? You have pain, swelling, or redness that gets worse. ? You have fluid or blood coming from the wound, scrape, corn, or callus. ? Your wound, scrape, corn, or callus feels warm to the touch. ? You have pus or a bad smell coming from the wound, scrape, corn, or callus. ? You have a fever. ? You have a red line going up your leg. Summary  Check your feet every day  for cuts, sores, red spots, swelling, and blisters.  Moisturize feet and legs daily.  Wear shoes that fit properly and have enough cushioning.  If you have foot problems, report any cuts, sores, or bruises to your health care provider immediately.  Schedule a complete foot exam at least once a year (annually) or more often if you have foot problems. This information is not intended to replace advice given to you by your health care provider. Make sure you discuss any questions you have with your health care provider. Document Revised: 09/21/2018 Document Reviewed: 01/31/2016 Elsevier Patient Education  2020 [employer]  Onychomycosis/Fungal Toenails  WHAT IS IT? An infection that lies within the keratin of your nail plate that is caused by a fungus.  WHY ME? Fungal infections affect all ages, sexes, races, and creeds.  There may be many factors that predispose you to a fungal infection such as age, coexisting medical conditions such as diabetes, or an autoimmune disease; stress, medications, fatigue, genetics, etc.  Bottom line: fungus thrives in a warm, moist environment and your shoes offer such a location.  IS IT CONTAGIOUS? Theoretically, yes.  You do not want to share shoes, nail clippers or files with someone who has fungal toenails.  Walking around barefoot in the same room or sleeping in the same bed is unlikely to transfer the organism.  It is important to realize, however, that fungus can spread easily from one nail to the next on the same foot.  HOW DO WE TREAT THIS?  There are several ways to treat this condition.  Treatment may depend on many factors such as age, medications, pregnancy, liver and kidney conditions, etc.  It is best to ask your doctor which options are available to you.  5. No treatment.   Unlike many other medical concerns, you can live with this condition.  However for many people this can be a painful condition and may lead to ingrown toenails or a bacterial  infection.  It is recommended that you keep the nails cut short to help reduce the amount of fungal nail. 6. Topical treatment.  These range from herbal remedies to prescription strength nail lacquers.  About 40-50% effective, topicals require twice daily application for approximately 9 to 12 months or until an entirely new nail has grown out.  The most effective topicals are medical grade medications available through physicians offices. 7. Oral antifungal medications.  With an 80-90% cure rate, the most common oral medication requires 3 to 4 months of therapy and stays in your system for a year as the new nail grows out.  Oral antifungal medications do require blood work to make   sure it is a safe drug for you.  A liver function panel will be performed prior to starting the medication and after the first month of treatment.  It is important to have the blood work performed to avoid any harmful side effects.  In general, this medication safe but blood work is required. 8. Laser Therapy.  This treatment is performed by applying a specialized laser to the affected nail plate.  This therapy is noninvasive, fast, and non-painful.  It is not covered by insurance and is therefore, out of pocket.  The results have been very good with a 80-95% cure rate.  [facility] is the only practice in the area to offer this therapy. 9. Permanent Nail Avulsion.  Removing the entire nail so that a new nail will not grow back. 

## 2019-02-02 NOTE — Progress Notes (Signed)
Subjective: ARISTIDE WAGGLE presents today for follow up of painful mycotic nails b/l that are difficult to trim. Pain interferes with ambulation. Aggravating factors include wearing enclosed shoe gear. Pain is relieved with periodic professional debridement. and ingrown toenail to the left, right, hallux.   No Known Allergies   Objective: There were no vitals filed for this visit.  Vascular Examination:  Palpable DP pulses b/l PT pulses faintly palpable b/l Capillary refill time to digits immediate b/l Pedal hair absent b/l  Skin temperature gradient within normal limits  Dermatological Examination: normal texture, turgor, and tone bilaterally, no open wounds bilaterally and no interdigital macerations bilaterally onychomycosis of the toenails x 10: discolored, thickened, subungual debris and pain to palpation of nail beds.  ingrown nail at medial and lateral borders b/l great toes: no erythema, no edema, no drainage, no flocculence.  Musculoskeletal: normal muscle strength 5/5 to all lower extremity muscle groups bilaterally, no gross bony deformities bilaterally and no pain crepitus or joint limitation noted with ROM  Neurological: sensation intact 5/5 intact bilaterally with 10g monofilament, vibratory sensation intact and proprioception intact bilaterally  Assessment: 1. Pain due to onychomycosis of toenails of both feet   2. Pain in toes of both feet   3. Ingrown toenail without infection     Plan: Toenails 1-5 b/l were debrided in length and girth without iatrogenic bleeding. Patient to continue soft, supportive shoe gear daily. Patient to report any pedal injuries to medical professional immediately. Continue diabetic shoes daily Patient to report any pedal injuries to medical professional immediately. Patient/POA to call should there be question/concern in the interim. Offending nail borders debrided and curretaged b/l great toes. Borders cleansed with alcohol.  Antibiotic ointment applied. No further treatment required by patient.  Return in about 3 months (around 04/27/2019).

## 2019-02-14 DIAGNOSIS — H401132 Primary open-angle glaucoma, bilateral, moderate stage: Secondary | ICD-10-CM | POA: Diagnosis not present

## 2019-02-14 DIAGNOSIS — E113293 Type 2 diabetes mellitus with mild nonproliferative diabetic retinopathy without macular edema, bilateral: Secondary | ICD-10-CM | POA: Diagnosis not present

## 2019-02-27 DIAGNOSIS — E1142 Type 2 diabetes mellitus with diabetic polyneuropathy: Secondary | ICD-10-CM | POA: Diagnosis not present

## 2019-02-27 DIAGNOSIS — I129 Hypertensive chronic kidney disease with stage 1 through stage 4 chronic kidney disease, or unspecified chronic kidney disease: Secondary | ICD-10-CM | POA: Diagnosis not present

## 2019-02-27 DIAGNOSIS — E7800 Pure hypercholesterolemia, unspecified: Secondary | ICD-10-CM | POA: Diagnosis not present

## 2019-02-27 DIAGNOSIS — E1121 Type 2 diabetes mellitus with diabetic nephropathy: Secondary | ICD-10-CM | POA: Diagnosis not present

## 2019-02-27 DIAGNOSIS — I10 Essential (primary) hypertension: Secondary | ICD-10-CM | POA: Diagnosis not present

## 2019-02-27 DIAGNOSIS — N184 Chronic kidney disease, stage 4 (severe): Secondary | ICD-10-CM | POA: Diagnosis not present

## 2019-02-27 DIAGNOSIS — E1151 Type 2 diabetes mellitus with diabetic peripheral angiopathy without gangrene: Secondary | ICD-10-CM | POA: Diagnosis not present

## 2019-03-29 ENCOUNTER — Ambulatory Visit: Payer: Self-pay

## 2019-03-29 DIAGNOSIS — E1121 Type 2 diabetes mellitus with diabetic nephropathy: Secondary | ICD-10-CM | POA: Diagnosis not present

## 2019-03-29 DIAGNOSIS — I129 Hypertensive chronic kidney disease with stage 1 through stage 4 chronic kidney disease, or unspecified chronic kidney disease: Secondary | ICD-10-CM | POA: Diagnosis not present

## 2019-03-29 DIAGNOSIS — N184 Chronic kidney disease, stage 4 (severe): Secondary | ICD-10-CM | POA: Diagnosis not present

## 2019-03-29 DIAGNOSIS — E7800 Pure hypercholesterolemia, unspecified: Secondary | ICD-10-CM | POA: Diagnosis not present

## 2019-03-29 DIAGNOSIS — E1142 Type 2 diabetes mellitus with diabetic polyneuropathy: Secondary | ICD-10-CM | POA: Diagnosis not present

## 2019-03-29 DIAGNOSIS — E1151 Type 2 diabetes mellitus with diabetic peripheral angiopathy without gangrene: Secondary | ICD-10-CM | POA: Diagnosis not present

## 2019-04-03 DIAGNOSIS — E1142 Type 2 diabetes mellitus with diabetic polyneuropathy: Secondary | ICD-10-CM | POA: Diagnosis not present

## 2019-04-03 DIAGNOSIS — Z79899 Other long term (current) drug therapy: Secondary | ICD-10-CM | POA: Diagnosis not present

## 2019-04-03 DIAGNOSIS — E1121 Type 2 diabetes mellitus with diabetic nephropathy: Secondary | ICD-10-CM | POA: Diagnosis not present

## 2019-04-03 DIAGNOSIS — I129 Hypertensive chronic kidney disease with stage 1 through stage 4 chronic kidney disease, or unspecified chronic kidney disease: Secondary | ICD-10-CM | POA: Diagnosis not present

## 2019-04-03 DIAGNOSIS — E1151 Type 2 diabetes mellitus with diabetic peripheral angiopathy without gangrene: Secondary | ICD-10-CM | POA: Diagnosis not present

## 2019-04-03 DIAGNOSIS — Z6838 Body mass index (BMI) 38.0-38.9, adult: Secondary | ICD-10-CM | POA: Diagnosis not present

## 2019-04-03 DIAGNOSIS — N184 Chronic kidney disease, stage 4 (severe): Secondary | ICD-10-CM | POA: Diagnosis not present

## 2019-04-05 ENCOUNTER — Ambulatory Visit: Payer: Self-pay

## 2019-04-19 DIAGNOSIS — D631 Anemia in chronic kidney disease: Secondary | ICD-10-CM | POA: Diagnosis not present

## 2019-04-19 DIAGNOSIS — N189 Chronic kidney disease, unspecified: Secondary | ICD-10-CM | POA: Diagnosis not present

## 2019-04-19 DIAGNOSIS — N2581 Secondary hyperparathyroidism of renal origin: Secondary | ICD-10-CM | POA: Diagnosis not present

## 2019-04-19 DIAGNOSIS — N184 Chronic kidney disease, stage 4 (severe): Secondary | ICD-10-CM | POA: Diagnosis not present

## 2019-04-19 DIAGNOSIS — I129 Hypertensive chronic kidney disease with stage 1 through stage 4 chronic kidney disease, or unspecified chronic kidney disease: Secondary | ICD-10-CM | POA: Diagnosis not present

## 2019-04-19 DIAGNOSIS — E1122 Type 2 diabetes mellitus with diabetic chronic kidney disease: Secondary | ICD-10-CM | POA: Diagnosis not present

## 2019-04-21 ENCOUNTER — Ambulatory Visit (INDEPENDENT_AMBULATORY_CARE_PROVIDER_SITE_OTHER): Payer: HMO | Admitting: Podiatry

## 2019-04-21 ENCOUNTER — Other Ambulatory Visit: Payer: Self-pay

## 2019-04-21 VITALS — Temp 98.1°F

## 2019-04-21 DIAGNOSIS — M79675 Pain in left toe(s): Secondary | ICD-10-CM

## 2019-04-21 DIAGNOSIS — B351 Tinea unguium: Secondary | ICD-10-CM | POA: Diagnosis not present

## 2019-04-21 DIAGNOSIS — M79674 Pain in right toe(s): Secondary | ICD-10-CM | POA: Diagnosis not present

## 2019-04-21 DIAGNOSIS — E1122 Type 2 diabetes mellitus with diabetic chronic kidney disease: Secondary | ICD-10-CM | POA: Diagnosis not present

## 2019-04-21 DIAGNOSIS — N184 Chronic kidney disease, stage 4 (severe): Secondary | ICD-10-CM

## 2019-04-21 NOTE — Patient Instructions (Signed)
Diabetes Mellitus and Foot Care Foot care is an important part of your health, especially when you have diabetes. Diabetes may cause you to have problems because of poor blood flow (circulation) to your feet and legs, which can cause your skin to:  Become thinner and drier.  Break more easily.  Heal more slowly.  Peel and crack. You may also have nerve damage (neuropathy) in your legs and feet, causing decreased feeling in them. This means that you may not notice minor injuries to your feet that could lead to more serious problems. Noticing and addressing any potential problems early is the best way to prevent future foot problems. How to care for your feet Foot hygiene  Wash your feet daily with warm water and mild soap. Do not use hot water. Then, pat your feet and the areas between your toes until they are completely dry. Do not soak your feet as this can dry your skin.  Trim your toenails straight across. Do not dig under them or around the cuticle. File the edges of your nails with an emery board or nail file.  Apply a moisturizing lotion or petroleum jelly to the skin on your feet and to dry, brittle toenails. Use lotion that does not contain alcohol and is unscented. Do not apply lotion between your toes. Shoes and socks  Wear clean socks or stockings every day. Make sure they are not too tight. Do not wear knee-high stockings since they may decrease blood flow to your legs.  Wear shoes that fit properly and have enough cushioning. Always look in your shoes before you put them on to be sure there are no objects inside.  To break in new shoes, wear them for just a few hours a day. This prevents injuries on your feet. Wounds, scrapes, corns, and calluses  Check your feet daily for blisters, cuts, bruises, sores, and redness. If you cannot see the bottom of your feet, use a mirror or ask someone for help.  Do not cut corns or calluses or try to remove them with medicine.  If you  find a minor scrape, cut, or break in the skin on your feet, keep it and the skin around it clean and dry. You may clean these areas with mild soap and water. Do not clean the area with peroxide, alcohol, or iodine.  If you have a wound, scrape, corn, or callus on your foot, look at it several times a day to make sure it is healing and not infected. Check for: ? Redness, swelling, or pain. ? Fluid or blood. ? Warmth. ? Pus or a bad smell. General instructions  Do not cross your legs. This may decrease blood flow to your feet.  Do not use heating pads or hot water bottles on your feet. They may burn your skin. If you have lost feeling in your feet or legs, you may not know this is happening until it is too late.  Protect your feet from hot and cold by wearing shoes, such as at the beach or on hot pavement.  Schedule a complete foot exam at least once a year (annually) or more often if you have foot problems. If you have foot problems, report any cuts, sores, or bruises to your health care provider immediately. Contact a health care provider if:  You have a medical condition that increases your risk of infection and you have any cuts, sores, or bruises on your feet.  You have an injury that is not   healing.  You have redness on your legs or feet.  You feel burning or tingling in your legs or feet.  You have pain or cramps in your legs and feet.  Your legs or feet are numb.  Your feet always feel cold.  You have pain around a toenail. Get help right away if:  You have a wound, scrape, corn, or callus on your foot and: ? You have pain, swelling, or redness that gets worse. ? You have fluid or blood coming from the wound, scrape, corn, or callus. ? Your wound, scrape, corn, or callus feels warm to the touch. ? You have pus or a bad smell coming from the wound, scrape, corn, or callus. ? You have a fever. ? You have a red line going up your leg. Summary  Check your feet every day  for cuts, sores, red spots, swelling, and blisters.  Moisturize feet and legs daily.  Wear shoes that fit properly and have enough cushioning.  If you have foot problems, report any cuts, sores, or bruises to your health care provider immediately.  Schedule a complete foot exam at least once a year (annually) or more often if you have foot problems. This information is not intended to replace advice given to you by your health care provider. Make sure you discuss any questions you have with your health care provider. Document Revised: 09/21/2018 Document Reviewed: 01/31/2016 Elsevier Patient Education  2020 Elsevier Inc.  

## 2019-04-24 ENCOUNTER — Encounter: Payer: Self-pay | Admitting: Podiatry

## 2019-04-24 NOTE — Progress Notes (Signed)
Subjective: Daniel Warner presents today for follow up of at risk foot care. Pt has h/o NIDDM with chronic kidney disease and painful mycotic nails b/l that are difficult to trim. Pain interferes with ambulation. Aggravating factors include wearing enclosed shoe gear. Pain is relieved with periodic professional debridement.   He voices no new pedal problems on today's visit.  No Known Allergies   Objective: Vitals:   04/21/19 1509  Temp: 98.1 F (36.7 C)    Pt 79 y.o. year old male  in NAD. AAO x 3.   Vascular Examination:  Capillary refill time to digits immediate b/l. Palpable DP pulses b/l. Faintly palpable PT pulses b/l. Pedal hair absent b/l Skin temperature gradient within normal limits b/l.  Dermatological Examination: Pedal skin with normal turgor, texture and tone bilaterally. No open wounds bilaterally. No interdigital macerations bilaterally. Toenails 1-5 b/l elongated, dystrophic, thickened, crumbly with subungual debris and tenderness to dorsal palpation.  Musculoskeletal: Normal muscle strength 5/5 to all lower extremity muscle groups bilaterally, no gross bony deformities bilaterally and no pain crepitus or joint limitation noted with ROM b/l  Neurological: Protective sensation intact 5/5 intact bilaterally with 10g monofilament b/l Vibratory sensation intact b/l  Assessment: 1. Pain due to onychomycosis of toenails of both feet   2. Type 2 diabetes mellitus with stage 4 chronic kidney disease, without long-term current use of insulin ([place])    Plan: -Continue diabetic foot care principles. Literature dispensed on today.  -Toenails 1-5 b/l were debrided in length and girth with sterile nail nippers and dremel without iatrogenic bleeding.  -Patient to continue soft, supportive shoe gear daily. -Patient to report any pedal injuries to medical professional immediately. -Patient/POA to call should there be question/concern in the interim.  Return in about 3 months  (around 07/21/2019).

## 2019-04-26 DIAGNOSIS — I129 Hypertensive chronic kidney disease with stage 1 through stage 4 chronic kidney disease, or unspecified chronic kidney disease: Secondary | ICD-10-CM | POA: Diagnosis not present

## 2019-04-26 DIAGNOSIS — E1142 Type 2 diabetes mellitus with diabetic polyneuropathy: Secondary | ICD-10-CM | POA: Diagnosis not present

## 2019-04-26 DIAGNOSIS — E1151 Type 2 diabetes mellitus with diabetic peripheral angiopathy without gangrene: Secondary | ICD-10-CM | POA: Diagnosis not present

## 2019-04-26 DIAGNOSIS — E1121 Type 2 diabetes mellitus with diabetic nephropathy: Secondary | ICD-10-CM | POA: Diagnosis not present

## 2019-04-26 DIAGNOSIS — E7800 Pure hypercholesterolemia, unspecified: Secondary | ICD-10-CM | POA: Diagnosis not present

## 2019-04-26 DIAGNOSIS — N184 Chronic kidney disease, stage 4 (severe): Secondary | ICD-10-CM | POA: Diagnosis not present

## 2019-06-06 DIAGNOSIS — E1142 Type 2 diabetes mellitus with diabetic polyneuropathy: Secondary | ICD-10-CM | POA: Diagnosis not present

## 2019-06-06 DIAGNOSIS — E1121 Type 2 diabetes mellitus with diabetic nephropathy: Secondary | ICD-10-CM | POA: Diagnosis not present

## 2019-06-06 DIAGNOSIS — E1151 Type 2 diabetes mellitus with diabetic peripheral angiopathy without gangrene: Secondary | ICD-10-CM | POA: Diagnosis not present

## 2019-06-06 DIAGNOSIS — N184 Chronic kidney disease, stage 4 (severe): Secondary | ICD-10-CM | POA: Diagnosis not present

## 2019-06-06 DIAGNOSIS — E7800 Pure hypercholesterolemia, unspecified: Secondary | ICD-10-CM | POA: Diagnosis not present

## 2019-06-06 DIAGNOSIS — I129 Hypertensive chronic kidney disease with stage 1 through stage 4 chronic kidney disease, or unspecified chronic kidney disease: Secondary | ICD-10-CM | POA: Diagnosis not present

## 2019-06-20 DIAGNOSIS — E1151 Type 2 diabetes mellitus with diabetic peripheral angiopathy without gangrene: Secondary | ICD-10-CM | POA: Diagnosis not present

## 2019-06-20 DIAGNOSIS — N184 Chronic kidney disease, stage 4 (severe): Secondary | ICD-10-CM | POA: Diagnosis not present

## 2019-06-20 DIAGNOSIS — E7800 Pure hypercholesterolemia, unspecified: Secondary | ICD-10-CM | POA: Diagnosis not present

## 2019-06-20 DIAGNOSIS — E1142 Type 2 diabetes mellitus with diabetic polyneuropathy: Secondary | ICD-10-CM | POA: Diagnosis not present

## 2019-06-20 DIAGNOSIS — I129 Hypertensive chronic kidney disease with stage 1 through stage 4 chronic kidney disease, or unspecified chronic kidney disease: Secondary | ICD-10-CM | POA: Diagnosis not present

## 2019-06-20 DIAGNOSIS — E1121 Type 2 diabetes mellitus with diabetic nephropathy: Secondary | ICD-10-CM | POA: Diagnosis not present

## 2019-07-04 ENCOUNTER — Other Ambulatory Visit: Payer: Self-pay | Admitting: *Deleted

## 2019-07-04 ENCOUNTER — Encounter: Payer: Self-pay | Admitting: *Deleted

## 2019-07-04 NOTE — Patient Outreach (Signed)
[facility]) Care Management Chronic Special Needs Program  07/04/2019  Name: Daniel Warner DOB: 08/20/1940  MRN: [id number]  Daniel Warner is enrolled in a chronic special needs plan for Diabetes. Reviewed and updated care plan.  Subjective: Client reports he continues to live with his spouse, remains independent, continues to drive and is attending all health care provider appointments, continues using Freestyle Libre continuous glucose monitor, walks daily and has goal "get stamina back up", client feels walking will help him with this.  Client reports he checks blood pressure approximately every 4 days and "readings are good", reports fasting blood sugar average 90-102 and client pleased with results.  Client reports he does not have HTA calendar or 24 hour nurse line magnet and requests these be mailed.  Goals Addressed            This Visit's Progress   . "get my stamina back up"       Congratulations on your exercising, continue to walk Any activity is helpful RN care manager mailed EMMI education articles "Walking to Fitness"  "Exercise and Aging" HTA has health coaching resource, if you are ever interested, please let RN care manager know at [phone]     . Client understands the importance of follow-up with providers by attending scheduled visits   On track    Client consistently keeps provider appointments    . Client will verbalize knowledge of self management of Hypertension as evidences by BP reading of 140/90 or less; or as defined by provider       Plan to check blood pressure regularly.  If you do not have a B/P monitor (cuff), one can be provided to you.  Write results in your [employer] calendar (in the back section). Reviewed blood pressure medication from EMR. Take B/P medications as ordered.  Some may cause you to use the bathroom more. Plan to eat low salt and heart healthy meals full of fruits, vegetables, whole grains,  lean protein and limit fat and sugars. Increase activity as tolerated. Reviewed lifestyle modification- smoking cessation, weight control and reducing stress. Please use 24 hour nurse advice line as needed at [phone]     . HEMOGLOBIN A1C < 7       Congratulations, your AIC is below goal at 6.3 Your last documented AIC is  08/09/18.  Have your [facility] checked every 6 months if you are at goal or every 3 months if you are not at goal. Check blood sugars daily before eating with goal of 80-130.  You can also check 1 1/2 hours after eating with goal of 180 or less. Plan to eat low carbohydrate and low salt meals, watch portion sizes and avoid sugar sweetened drinks.  Discussed carbohydrate control meals. Reviewed signs and symptoms of hyperglycemia (high blood sugar) and hypoglycemia (low blood sugar) and actions to take. Review [employer] calendar (sent in the mail) for diabetes action plan in the back. Increase activity only if you are able to do it.  Follow doctor recommendations. EMMI education provided on "Diabetes and Diet".  Review and plan to discuss with RN during next telephonic assessment.       . Obtain annual  Lipid Profile, LDL-C       Per medical record review, Lipid profile completed on 04/03/19 LDL= 99 The goal for LDL is less than $RemoveB'70mg'[id number]$ /dl as you are at high risk for complications. Try to avoid saturated fats, trans-fats and eat more fiber. Plan  to take statin (cholesterol) medicine as ordered.      . Obtain Annual Eye (retinal)  Exam    On track    Your last documented eye exam was on 04/21/19 Diabetes can affect your vision.  Plan to have a dilated eye exam every year. Advised client to keep and/ or schedule appointment with eye doctor. Continue to use your eye drops as prescribed.      . Obtain Annual Foot Exam   On track    Your doctor should check your bare feet at each visit. Diabetes can affect the nerves in your feet, causing decreased feeling or  numbness. Check your feet and in-between toes daily for cuts, bruises, redness, blisters or sores.  If you cannot reach them, use a mirror. Wash feet with soap and water, dry feet well especially between toes.  Don't use too much lotion. Wear shoes that are not too tight and don't walk barefoot.      . Obtain annual screen for micro albuminuria (urine) , nephropathy (kidney problems)   On track    Diabetes can affect your kidneys. It is important for your doctor to check your urine at least once a year  These tests show how your kidneys are working.     . Obtain Hemoglobin A1C at least 2 times per year       Hgb A1C completed on 08/09/18   6.3,  improved from 8.2 (on 11/25/17)     . Visit Primary Care Provider or Endocrinologist at least 2 times per year    On track    Client keeps provider appointments, most recent office visit with primary care provider completed May 2021       Plan:    RN care manager faxed today's note with updated individualized care plan to primary care provider, mailed updated individualized care plan to client along with education materials, 24 hour nurse line magnet, HTA calendar.  Chronic care management coordinator will outreach in:  9-12 months    Kassie Mends Nursing/RN [place] Case Manager, C-SNP  [phone]  .

## 2019-07-13 DIAGNOSIS — I129 Hypertensive chronic kidney disease with stage 1 through stage 4 chronic kidney disease, or unspecified chronic kidney disease: Secondary | ICD-10-CM | POA: Diagnosis not present

## 2019-07-13 DIAGNOSIS — E1121 Type 2 diabetes mellitus with diabetic nephropathy: Secondary | ICD-10-CM | POA: Diagnosis not present

## 2019-07-13 DIAGNOSIS — E1142 Type 2 diabetes mellitus with diabetic polyneuropathy: Secondary | ICD-10-CM | POA: Diagnosis not present

## 2019-07-13 DIAGNOSIS — E1151 Type 2 diabetes mellitus with diabetic peripheral angiopathy without gangrene: Secondary | ICD-10-CM | POA: Diagnosis not present

## 2019-07-13 DIAGNOSIS — E7800 Pure hypercholesterolemia, unspecified: Secondary | ICD-10-CM | POA: Diagnosis not present

## 2019-07-13 DIAGNOSIS — N184 Chronic kidney disease, stage 4 (severe): Secondary | ICD-10-CM | POA: Diagnosis not present

## 2019-07-24 ENCOUNTER — Encounter: Payer: Self-pay | Admitting: Podiatry

## 2019-07-24 ENCOUNTER — Other Ambulatory Visit: Payer: Self-pay

## 2019-07-24 ENCOUNTER — Ambulatory Visit: Payer: HMO | Admitting: Podiatry

## 2019-07-24 DIAGNOSIS — B351 Tinea unguium: Secondary | ICD-10-CM

## 2019-07-24 DIAGNOSIS — M79675 Pain in left toe(s): Secondary | ICD-10-CM

## 2019-07-24 DIAGNOSIS — M79674 Pain in right toe(s): Secondary | ICD-10-CM | POA: Diagnosis not present

## 2019-07-24 DIAGNOSIS — N184 Chronic kidney disease, stage 4 (severe): Secondary | ICD-10-CM

## 2019-07-24 DIAGNOSIS — E1122 Type 2 diabetes mellitus with diabetic chronic kidney disease: Secondary | ICD-10-CM

## 2019-07-28 NOTE — Progress Notes (Signed)
Subjective:  Patient ID: Daniel Warner, male    DOB: [birth year],  MRN: [id number]  Daniel Warner presents to clinic today for at risk foot care. Pt has h/o NIDDM with chronic kidney disease and painful thick toenails that are difficult to trim. Pain interferes with ambulation. Aggravating factors include wearing enclosed shoe gear. Pain is relieved with periodic professional debridement.  79 y.o. male presents with the above complaint.  Reports painfully elongated nails to both feet.  Review of Systems: Negative except as noted in the HPI. Past Medical History:  Diagnosis Date  . Benign hypertension with chronic kidney disease, stage III   . Carotid artery disease ([place])   . CKD (chronic kidney disease), stage III   . Diabetes mellitus type 2 with peripheral artery disease ([place])   . Diabetes mellitus without complication ([place])   . Discolored skin    Right leg, thinks it's from a bag that has rubbed against his leg for so long. Doppler 12/2010  . GERD (gastroesophageal reflux disease)   . Hypercholesterolemia   . Hyperlipidemia   . Numbness of toes    In the morning  . Obesity   . Peripheral arterial disease [facility])    Past Surgical History:  Procedure Laterality Date  . PERIPHERAL VASCULAR CATHETERIZATION N/A 10/16/2014   Procedure: Abdominal Aortogram w/Lower Extremity;  Surgeon: Serafina Mitchell, MD;  Location: [place] [facility];  Service: Cardiovascular;  Laterality: N/A;  . TONSILLECTOMY    . WISDOM TOOTH EXTRACTION      Current Outpatient Medications:  .  ALLERGY RELIEF 10 MG tablet, 1 TABLET BY MOUTH ONCE DAILY., Disp: , Rfl:  .  amLODipine (NORVASC) 10 MG tablet, Take 10 mg by mouth every morning., Disp: , Rfl:  .  atorvastatin (LIPITOR) 20 MG tablet, Take 20 mg by mouth daily. (Patient not taking: Reported on 07/04/2019), Disp: , Rfl: 11 .  atorvastatin (LIPITOR) 40 MG tablet, TAKE 1 TABELT BY MOUTH DAILY IN THE MORNING FOR CHOLESTEROL, Disp: , Rfl:  .  calcitRIOL  (ROCALTROL) 0.25 MCG capsule, Take 0.25 mcg by mouth daily., Disp: , Rfl: 0 .  CARAFATE 1 GM/10ML suspension, SHAKE LQ AND TK 10 ML PO QD B THE EVE MEAL OES (Patient not taking: Reported on 07/04/2019), Disp: , Rfl: 2 .  clopidogrel (PLAVIX) 75 MG tablet, Take 1 tablet (75 mg total) by mouth daily with breakfast., Disp: 30 tablet, Rfl: 1 .  Continuous Blood Gluc Receiver (FREESTYLE LIBRE 14 DAY READER) DEVI, , Disp: , Rfl:  .  Continuous Blood Gluc Sensor (FREESTYLE LIBRE 14 DAY SENSOR) MISC, , Disp: , Rfl:  .  famotidine (PEPCID) 20 MG tablet, TAKE 2 TABLETS ($RemoveBe'40MG'fTLHtfydg$ ) BY MOUTH AT BEDTIME FOR HEARTBURN, Disp: , Rfl:  .  furosemide (LASIX) 40 MG tablet, Take 40 mg by mouth daily., Disp: , Rfl: 0 .  glimepiride (AMARYL) 4 MG tablet, Take 4 mg by mouth daily with breakfast., Disp: , Rfl:  .  telmisartan (MICARDIS) 80 MG tablet, , Disp: , Rfl:  .  TRULICITY 1.5 OM/7.6HM SOPN, INJECT 1 SUBCUTANEOUSLY ONCE WEEKLY, Disp: , Rfl:  No Known Allergies Social History   Occupational History  . Not on file  Tobacco Use  . Smoking status: Never Smoker  . Smokeless tobacco: Never Used  Substance and Sexual Activity  . Alcohol use: No  . Drug use: No  . Sexual activity: Not on file    Objective:   Constitutional Daniel Warner is a pleasant 79  y.o. [place] American male, WD, WN in NAD.Marland Kitchen AAO x 3.   Vascular Dorsalis pedis pulses palpable bilaterally. Posterior tibial pulses palpable bilaterally. Capillary refill normal to all digits.  No cyanosis or clubbing noted. Pedal hair growth diminished b/l.  Neurologic Normal speech. Oriented to person, place, and time. Epicritic sensation to light touch grossly present bilaterally. Protective sensation intact 5/5 intact bilaterally with 10g monofilament b/l. Proprioception intact bilaterally. Clonus negative b/l.  Dermatologic Pedal skin with normal turgor, texture and tone b/l.  Toenails are discolored, thick, elongated, dystrophic with pain on palpation x  10 No open wounds. No skin lesions.  Orthopedic: Normal muscle strength 5/5 to all lower extremity muscle groups bilaterally. No pain crepitus or joint limitation noted with ROM b/l. No gross bony deformities bilaterally. No bony tenderness.    Radiographs: None Assessment:  No diagnosis found. Plan:  Patient was evaluated and treated and all questions answered.  Onychomycosis with pain -Nails palliatively debridement as below -Educated on self-care  Procedure: Nail Debridement Rationale: Pain Type of Debridement: manual, sharp debridement. Instrumentation: Nail nipper, rotary burr. Number of Nails: 10 -No new findings. No new orders. -Continue diabetic foot care principles. -Toenails 1-5 b/l were debrided in length and girth with sterile nail nippers and dremel without iatrogenic bleeding.  -Patient to report any pedal injuries to medical professional immediately. -Patient to continue soft, supportive shoe gear daily. -Patient/POA to call should there be question/concern in the interim.  Return in about 3 months (around 10/24/2019) for diabetic nail trim, Plavix.  Marzetta Board, DPM

## 2019-08-04 DIAGNOSIS — E1151 Type 2 diabetes mellitus with diabetic peripheral angiopathy without gangrene: Secondary | ICD-10-CM | POA: Diagnosis not present

## 2019-08-04 DIAGNOSIS — Z79899 Other long term (current) drug therapy: Secondary | ICD-10-CM | POA: Diagnosis not present

## 2019-08-04 DIAGNOSIS — E1142 Type 2 diabetes mellitus with diabetic polyneuropathy: Secondary | ICD-10-CM | POA: Diagnosis not present

## 2019-08-04 DIAGNOSIS — E1121 Type 2 diabetes mellitus with diabetic nephropathy: Secondary | ICD-10-CM | POA: Diagnosis not present

## 2019-08-04 DIAGNOSIS — E7800 Pure hypercholesterolemia, unspecified: Secondary | ICD-10-CM | POA: Diagnosis not present

## 2019-08-04 DIAGNOSIS — G4733 Obstructive sleep apnea (adult) (pediatric): Secondary | ICD-10-CM | POA: Diagnosis not present

## 2019-08-04 DIAGNOSIS — Z7984 Long term (current) use of oral hypoglycemic drugs: Secondary | ICD-10-CM | POA: Diagnosis not present

## 2019-08-04 DIAGNOSIS — Z0000 Encounter for general adult medical examination without abnormal findings: Secondary | ICD-10-CM | POA: Diagnosis not present

## 2019-08-04 DIAGNOSIS — N184 Chronic kidney disease, stage 4 (severe): Secondary | ICD-10-CM | POA: Diagnosis not present

## 2019-08-04 DIAGNOSIS — I129 Hypertensive chronic kidney disease with stage 1 through stage 4 chronic kidney disease, or unspecified chronic kidney disease: Secondary | ICD-10-CM | POA: Diagnosis not present

## 2019-08-04 DIAGNOSIS — Z1389 Encounter for screening for other disorder: Secondary | ICD-10-CM | POA: Diagnosis not present

## 2019-08-07 DIAGNOSIS — E1151 Type 2 diabetes mellitus with diabetic peripheral angiopathy without gangrene: Secondary | ICD-10-CM | POA: Diagnosis not present

## 2019-08-07 DIAGNOSIS — Z79899 Other long term (current) drug therapy: Secondary | ICD-10-CM | POA: Diagnosis not present

## 2019-08-14 DIAGNOSIS — H401132 Primary open-angle glaucoma, bilateral, moderate stage: Secondary | ICD-10-CM | POA: Diagnosis not present

## 2019-08-14 DIAGNOSIS — I129 Hypertensive chronic kidney disease with stage 1 through stage 4 chronic kidney disease, or unspecified chronic kidney disease: Secondary | ICD-10-CM | POA: Diagnosis not present

## 2019-08-14 DIAGNOSIS — E1142 Type 2 diabetes mellitus with diabetic polyneuropathy: Secondary | ICD-10-CM | POA: Diagnosis not present

## 2019-08-14 DIAGNOSIS — E7800 Pure hypercholesterolemia, unspecified: Secondary | ICD-10-CM | POA: Diagnosis not present

## 2019-08-14 DIAGNOSIS — E1151 Type 2 diabetes mellitus with diabetic peripheral angiopathy without gangrene: Secondary | ICD-10-CM | POA: Diagnosis not present

## 2019-08-14 DIAGNOSIS — N184 Chronic kidney disease, stage 4 (severe): Secondary | ICD-10-CM | POA: Diagnosis not present

## 2019-08-14 DIAGNOSIS — E1121 Type 2 diabetes mellitus with diabetic nephropathy: Secondary | ICD-10-CM | POA: Diagnosis not present

## 2019-08-31 DIAGNOSIS — E1122 Type 2 diabetes mellitus with diabetic chronic kidney disease: Secondary | ICD-10-CM | POA: Diagnosis not present

## 2019-08-31 DIAGNOSIS — I2510 Atherosclerotic heart disease of native coronary artery without angina pectoris: Secondary | ICD-10-CM | POA: Diagnosis not present

## 2019-08-31 DIAGNOSIS — K219 Gastro-esophageal reflux disease without esophagitis: Secondary | ICD-10-CM | POA: Diagnosis not present

## 2019-08-31 DIAGNOSIS — Z0000 Encounter for general adult medical examination without abnormal findings: Secondary | ICD-10-CM | POA: Diagnosis not present

## 2019-08-31 DIAGNOSIS — N184 Chronic kidney disease, stage 4 (severe): Secondary | ICD-10-CM | POA: Diagnosis not present

## 2019-08-31 DIAGNOSIS — D631 Anemia in chronic kidney disease: Secondary | ICD-10-CM | POA: Diagnosis not present

## 2019-08-31 DIAGNOSIS — N189 Chronic kidney disease, unspecified: Secondary | ICD-10-CM | POA: Diagnosis not present

## 2019-08-31 DIAGNOSIS — I129 Hypertensive chronic kidney disease with stage 1 through stage 4 chronic kidney disease, or unspecified chronic kidney disease: Secondary | ICD-10-CM | POA: Diagnosis not present

## 2019-08-31 DIAGNOSIS — N2581 Secondary hyperparathyroidism of renal origin: Secondary | ICD-10-CM | POA: Diagnosis not present

## 2019-08-31 DIAGNOSIS — E785 Hyperlipidemia, unspecified: Secondary | ICD-10-CM | POA: Diagnosis not present

## 2019-08-31 DIAGNOSIS — E1142 Type 2 diabetes mellitus with diabetic polyneuropathy: Secondary | ICD-10-CM | POA: Diagnosis not present

## 2019-08-31 DIAGNOSIS — I739 Peripheral vascular disease, unspecified: Secondary | ICD-10-CM | POA: Diagnosis not present

## 2019-09-20 DIAGNOSIS — E7800 Pure hypercholesterolemia, unspecified: Secondary | ICD-10-CM | POA: Diagnosis not present

## 2019-09-20 DIAGNOSIS — N184 Chronic kidney disease, stage 4 (severe): Secondary | ICD-10-CM | POA: Diagnosis not present

## 2019-09-20 DIAGNOSIS — E1121 Type 2 diabetes mellitus with diabetic nephropathy: Secondary | ICD-10-CM | POA: Diagnosis not present

## 2019-09-20 DIAGNOSIS — E1142 Type 2 diabetes mellitus with diabetic polyneuropathy: Secondary | ICD-10-CM | POA: Diagnosis not present

## 2019-09-20 DIAGNOSIS — I129 Hypertensive chronic kidney disease with stage 1 through stage 4 chronic kidney disease, or unspecified chronic kidney disease: Secondary | ICD-10-CM | POA: Diagnosis not present

## 2019-09-20 DIAGNOSIS — E1151 Type 2 diabetes mellitus with diabetic peripheral angiopathy without gangrene: Secondary | ICD-10-CM | POA: Diagnosis not present

## 2019-10-09 ENCOUNTER — Other Ambulatory Visit (HOSPITAL_BASED_OUTPATIENT_CLINIC_OR_DEPARTMENT_OTHER): Payer: Self-pay

## 2019-10-09 DIAGNOSIS — R0681 Apnea, not elsewhere classified: Secondary | ICD-10-CM

## 2019-10-09 DIAGNOSIS — G4710 Hypersomnia, unspecified: Secondary | ICD-10-CM

## 2019-10-09 DIAGNOSIS — R0683 Snoring: Secondary | ICD-10-CM

## 2019-10-27 DIAGNOSIS — E1142 Type 2 diabetes mellitus with diabetic polyneuropathy: Secondary | ICD-10-CM | POA: Diagnosis not present

## 2019-10-27 DIAGNOSIS — E1151 Type 2 diabetes mellitus with diabetic peripheral angiopathy without gangrene: Secondary | ICD-10-CM | POA: Diagnosis not present

## 2019-10-27 DIAGNOSIS — E1121 Type 2 diabetes mellitus with diabetic nephropathy: Secondary | ICD-10-CM | POA: Diagnosis not present

## 2019-10-27 DIAGNOSIS — I129 Hypertensive chronic kidney disease with stage 1 through stage 4 chronic kidney disease, or unspecified chronic kidney disease: Secondary | ICD-10-CM | POA: Diagnosis not present

## 2019-10-27 DIAGNOSIS — E7800 Pure hypercholesterolemia, unspecified: Secondary | ICD-10-CM | POA: Diagnosis not present

## 2019-10-27 DIAGNOSIS — N184 Chronic kidney disease, stage 4 (severe): Secondary | ICD-10-CM | POA: Diagnosis not present

## 2019-10-30 ENCOUNTER — Ambulatory Visit: Payer: HMO | Admitting: Podiatry

## 2019-10-30 ENCOUNTER — Encounter: Payer: Self-pay | Admitting: Podiatry

## 2019-10-30 ENCOUNTER — Other Ambulatory Visit: Payer: Self-pay

## 2019-10-30 DIAGNOSIS — M79675 Pain in left toe(s): Secondary | ICD-10-CM

## 2019-10-30 DIAGNOSIS — B351 Tinea unguium: Secondary | ICD-10-CM

## 2019-10-30 DIAGNOSIS — E1122 Type 2 diabetes mellitus with diabetic chronic kidney disease: Secondary | ICD-10-CM

## 2019-10-30 DIAGNOSIS — N184 Chronic kidney disease, stage 4 (severe): Secondary | ICD-10-CM

## 2019-10-30 DIAGNOSIS — M79674 Pain in right toe(s): Secondary | ICD-10-CM | POA: Diagnosis not present

## 2019-11-05 NOTE — Progress Notes (Signed)
Subjective:  Patient ID: Daniel Warner, male    DOB: [birth year],  MRN: [id number]  Daniel Warner presents to clinic today for at risk foot care. Pt has h/o NIDDM with chronic kidney disease and painful thick toenails that are difficult to trim. Pain interferes with ambulation. Aggravating factors include wearing enclosed shoe gear. Pain is relieved with periodic professional debridement.  He voices no new pedal concerns on today's visit.  Review of Systems: Negative except as noted in the HPI. Past Medical History:  Diagnosis Date  . Benign hypertension with chronic kidney disease, stage III ([place])   . Carotid artery disease ([place])   . CKD (chronic kidney disease), stage III ([place])   . Diabetes mellitus type 2 with peripheral artery disease ([place])   . Diabetes mellitus without complication ([place])   . Discolored skin    Right leg, thinks it's from a bag that has rubbed against his leg for so long. Doppler 12/2010  . GERD (gastroesophageal reflux disease)   . Hypercholesterolemia   . Hyperlipidemia   . Numbness of toes    In the morning  . Obesity   . Peripheral arterial disease [facility])    Past Surgical History:  Procedure Laterality Date  . PERIPHERAL VASCULAR CATHETERIZATION N/A 10/16/2014   Procedure: Abdominal Aortogram w/Lower Extremity;  Surgeon: Serafina Mitchell, MD;  Location: [place] [facility];  Service: Cardiovascular;  Laterality: N/A;  . TONSILLECTOMY    . WISDOM TOOTH EXTRACTION      Current Outpatient Medications:  .  ALLERGY RELIEF 10 MG tablet, 1 TABLET BY MOUTH ONCE DAILY., Disp: , Rfl:  .  amLODipine (NORVASC) 10 MG tablet, Take 10 mg by mouth every morning., Disp: , Rfl:  .  atorvastatin (LIPITOR) 20 MG tablet, Take 20 mg by mouth daily. (Patient not taking: Reported on 07/04/2019), Disp: , Rfl: 11 .  atorvastatin (LIPITOR) 40 MG tablet, TAKE 1 TABELT BY MOUTH DAILY IN THE MORNING FOR CHOLESTEROL, Disp: , Rfl:  .  calcitRIOL (ROCALTROL) 0.25 MCG capsule, Take  0.25 mcg by mouth daily., Disp: , Rfl: 0 .  CARAFATE 1 GM/10ML suspension, SHAKE LQ AND TK 10 ML PO QD B THE EVE MEAL OES (Patient not taking: Reported on 07/04/2019), Disp: , Rfl: 2 .  clopidogrel (PLAVIX) 75 MG tablet, Take 1 tablet (75 mg total) by mouth daily with breakfast., Disp: 30 tablet, Rfl: 1 .  Continuous Blood Gluc Receiver (FREESTYLE LIBRE 14 DAY READER) DEVI, , Disp: , Rfl:  .  Continuous Blood Gluc Sensor (FREESTYLE LIBRE 14 DAY SENSOR) MISC, , Disp: , Rfl:  .  famotidine (PEPCID) 20 MG tablet, TAKE 2 TABLETS ($RemoveBe'40MG'ShyLUkPUv$ ) BY MOUTH AT BEDTIME FOR HEARTBURN, Disp: , Rfl:  .  furosemide (LASIX) 40 MG tablet, Take 40 mg by mouth daily., Disp: , Rfl: 0 .  glimepiride (AMARYL) 4 MG tablet, Take 4 mg by mouth daily with breakfast., Disp: , Rfl:  .  telmisartan (MICARDIS) 80 MG tablet, , Disp: , Rfl:  .  TRULICITY 1.5 CM/0.3KJ SOPN, INJECT 1 SUBCUTANEOUSLY ONCE WEEKLY, Disp: , Rfl:  No Known Allergies Social History   Occupational History  . Not on file  Tobacco Use  . Smoking status: Never Smoker  . Smokeless tobacco: Never Used  Substance and Sexual Activity  . Alcohol use: No  . Drug use: No  . Sexual activity: Not on file    Objective:   Constitutional Daniel Warner is a pleasant 79 y.o. African American male, WD,  WN in NAD.Marland Kitchen AAO x 3.   Vascular Dorsalis pedis pulses palpable bilaterally. Posterior tibial pulses palpable bilaterally. Capillary refill normal to all digits.  No cyanosis or clubbing noted. Pedal hair growth diminished b/l.  Neurologic Normal speech. Oriented to person, place, and time. Epicritic sensation to light touch grossly present bilaterally. Protective sensation intact 5/5 intact bilaterally with 10g monofilament b/l. Proprioception intact bilaterally. Clonus negative b/l.  Dermatologic Pedal skin with normal turgor, texture and tone b/l.  Toenails are discolored, thick, elongated, dystrophic with pain on palpation x 10.No open wounds. No skin lesions.   Orthopedic: Normal muscle strength 5/5 to all lower extremity muscle groups bilaterally. No pain crepitus or joint limitation noted with ROM b/l. No gross bony deformities bilaterally. No bony tenderness.    Radiographs: None Assessment:   1. Pain due to onychomycosis of toenails of both feet   2. Type 2 diabetes mellitus with stage 4 chronic kidney disease, without long-term current use of insulin ([place])    Plan:  Patient was evaluated and treated and all questions answered.  Onychomycosis with pain -Nails palliatively debridement as below -Educated on self-care  Procedure: Nail Debridement Rationale: Pain Type of Debridement: manual, sharp debridement. Instrumentation: Nail nipper, rotary burr. Number of Nails: 10 -No new findings. No new orders. -Continue diabetic foot care principles. -Toenails 1-5 b/l were debrided in length and girth with sterile nail nippers and dremel without iatrogenic bleeding.  -Patient to report any pedal injuries to medical professional immediately. -Patient to continue soft, supportive shoe gear daily. -Patient/POA to call should there be question/concern in the interim.  Return in about 3 months (around 01/30/2020).  Marzetta Board, DPM

## 2019-11-17 DIAGNOSIS — E1151 Type 2 diabetes mellitus with diabetic peripheral angiopathy without gangrene: Secondary | ICD-10-CM | POA: Diagnosis not present

## 2019-11-17 DIAGNOSIS — E1121 Type 2 diabetes mellitus with diabetic nephropathy: Secondary | ICD-10-CM | POA: Diagnosis not present

## 2019-11-17 DIAGNOSIS — E7800 Pure hypercholesterolemia, unspecified: Secondary | ICD-10-CM | POA: Diagnosis not present

## 2019-11-17 DIAGNOSIS — N184 Chronic kidney disease, stage 4 (severe): Secondary | ICD-10-CM | POA: Diagnosis not present

## 2019-11-17 DIAGNOSIS — I129 Hypertensive chronic kidney disease with stage 1 through stage 4 chronic kidney disease, or unspecified chronic kidney disease: Secondary | ICD-10-CM | POA: Diagnosis not present

## 2019-11-17 DIAGNOSIS — E1142 Type 2 diabetes mellitus with diabetic polyneuropathy: Secondary | ICD-10-CM | POA: Diagnosis not present

## 2019-11-20 ENCOUNTER — Other Ambulatory Visit: Payer: Self-pay | Admitting: *Deleted

## 2019-11-20 NOTE — Patient Outreach (Signed)
  [facility]) Care Management Chronic Special Needs Program    11/20/2019  Name: Daniel Warner, DOB: 11/10/1940  MRN: [id number]   [name] is enrolled in a chronic special needs plan for Diabetes.  [facility] care management will continue to provide services for this member through 01/12/20.  [facility] care management team will assume care 01/13/20.   Jacqlyn Larsen [facility], BSN [place], [place]

## 2019-11-29 ENCOUNTER — Encounter (HOSPITAL_BASED_OUTPATIENT_CLINIC_OR_DEPARTMENT_OTHER): Payer: HMO | Admitting: Internal Medicine

## 2019-12-18 DIAGNOSIS — I129 Hypertensive chronic kidney disease with stage 1 through stage 4 chronic kidney disease, or unspecified chronic kidney disease: Secondary | ICD-10-CM | POA: Diagnosis not present

## 2019-12-18 DIAGNOSIS — N184 Chronic kidney disease, stage 4 (severe): Secondary | ICD-10-CM | POA: Diagnosis not present

## 2019-12-18 DIAGNOSIS — E1151 Type 2 diabetes mellitus with diabetic peripheral angiopathy without gangrene: Secondary | ICD-10-CM | POA: Diagnosis not present

## 2019-12-18 DIAGNOSIS — E1121 Type 2 diabetes mellitus with diabetic nephropathy: Secondary | ICD-10-CM | POA: Diagnosis not present

## 2019-12-18 DIAGNOSIS — E1142 Type 2 diabetes mellitus with diabetic polyneuropathy: Secondary | ICD-10-CM | POA: Diagnosis not present

## 2019-12-18 DIAGNOSIS — E7800 Pure hypercholesterolemia, unspecified: Secondary | ICD-10-CM | POA: Diagnosis not present

## 2020-01-17 ENCOUNTER — Other Ambulatory Visit: Payer: Self-pay | Admitting: *Deleted

## 2020-01-25 DIAGNOSIS — K219 Gastro-esophageal reflux disease without esophagitis: Secondary | ICD-10-CM | POA: Diagnosis not present

## 2020-01-25 DIAGNOSIS — I739 Peripheral vascular disease, unspecified: Secondary | ICD-10-CM | POA: Diagnosis not present

## 2020-01-25 DIAGNOSIS — E1142 Type 2 diabetes mellitus with diabetic polyneuropathy: Secondary | ICD-10-CM | POA: Diagnosis not present

## 2020-01-25 DIAGNOSIS — Z0000 Encounter for general adult medical examination without abnormal findings: Secondary | ICD-10-CM | POA: Diagnosis not present

## 2020-01-25 DIAGNOSIS — E1122 Type 2 diabetes mellitus with diabetic chronic kidney disease: Secondary | ICD-10-CM | POA: Diagnosis not present

## 2020-01-25 DIAGNOSIS — I129 Hypertensive chronic kidney disease with stage 1 through stage 4 chronic kidney disease, or unspecified chronic kidney disease: Secondary | ICD-10-CM | POA: Diagnosis not present

## 2020-01-25 DIAGNOSIS — N2581 Secondary hyperparathyroidism of renal origin: Secondary | ICD-10-CM | POA: Diagnosis not present

## 2020-01-25 DIAGNOSIS — I2510 Atherosclerotic heart disease of native coronary artery without angina pectoris: Secondary | ICD-10-CM | POA: Diagnosis not present

## 2020-01-25 DIAGNOSIS — D631 Anemia in chronic kidney disease: Secondary | ICD-10-CM | POA: Diagnosis not present

## 2020-01-25 DIAGNOSIS — E785 Hyperlipidemia, unspecified: Secondary | ICD-10-CM | POA: Diagnosis not present

## 2020-01-25 DIAGNOSIS — N184 Chronic kidney disease, stage 4 (severe): Secondary | ICD-10-CM | POA: Diagnosis not present

## 2020-01-25 DIAGNOSIS — N189 Chronic kidney disease, unspecified: Secondary | ICD-10-CM | POA: Diagnosis not present

## 2020-01-29 ENCOUNTER — Encounter (HOSPITAL_BASED_OUTPATIENT_CLINIC_OR_DEPARTMENT_OTHER): Payer: HMO | Admitting: Internal Medicine

## 2020-02-02 ENCOUNTER — Encounter: Payer: Self-pay | Admitting: Podiatry

## 2020-02-02 ENCOUNTER — Ambulatory Visit: Payer: HMO | Admitting: Podiatry

## 2020-02-02 ENCOUNTER — Other Ambulatory Visit: Payer: Self-pay

## 2020-02-02 DIAGNOSIS — E1122 Type 2 diabetes mellitus with diabetic chronic kidney disease: Secondary | ICD-10-CM

## 2020-02-02 DIAGNOSIS — B351 Tinea unguium: Secondary | ICD-10-CM

## 2020-02-02 DIAGNOSIS — M79675 Pain in left toe(s): Secondary | ICD-10-CM

## 2020-02-02 DIAGNOSIS — M79674 Pain in right toe(s): Secondary | ICD-10-CM

## 2020-02-02 DIAGNOSIS — M2141 Flat foot [pes planus] (acquired), right foot: Secondary | ICD-10-CM

## 2020-02-02 DIAGNOSIS — N184 Chronic kidney disease, stage 4 (severe): Secondary | ICD-10-CM

## 2020-02-02 DIAGNOSIS — M2142 Flat foot [pes planus] (acquired), left foot: Secondary | ICD-10-CM

## 2020-02-02 NOTE — Progress Notes (Signed)
Subjective:  Patient ID: Daniel Warner, male    DOB: [birth year],  MRN: [id number]  Daniel Warner presents to clinic today for at risk foot care. Pt has h/o NIDDM with chronic kidney disease and painful thick toenails that are difficult to trim. Pain interferes with ambulation. Aggravating factors include wearing enclosed shoe gear. Pain is relieved with periodic professional debridement.  He states his blood glucose was 80 mg/dl this morning. He is inquiring about orthotics on today's visit.  Review of Systems: Negative except as noted in the HPI. Past Medical History:  Diagnosis Date   Benign hypertension with chronic kidney disease, stage III (HCC)    Carotid artery disease (HCC)    CKD (chronic kidney disease), stage III (HCC)    Diabetes mellitus type 2 with peripheral artery disease ([place])    Diabetes mellitus without complication ([place])    Discolored skin    Right leg, thinks it's from a bag that has rubbed against his leg for so long. Doppler 12/2010   GERD (gastroesophageal reflux disease)    Hypercholesterolemia    Hyperlipidemia    Numbness of toes    In the morning   Obesity    Peripheral arterial disease ([place])    Past Surgical History:  Procedure Laterality Date   PERIPHERAL VASCULAR CATHETERIZATION N/A 10/16/2014   Procedure: Abdominal Aortogram w/Lower Extremity;  Surgeon: Serafina Mitchell, MD;  Location: [place] [facility];  Service: Cardiovascular;  Laterality: N/A;   TONSILLECTOMY     WISDOM TOOTH EXTRACTION      Current Outpatient Medications:    ALLERGY RELIEF 10 MG tablet, 1 TABLET BY MOUTH ONCE DAILY., Disp: , Rfl:    amLODipine (NORVASC) 10 MG tablet, Take 10 mg by mouth every morning., Disp: , Rfl:    atorvastatin (LIPITOR) 20 MG tablet, Take 20 mg by mouth daily. (Patient not taking: Reported on 07/04/2019), Disp: , Rfl: 11   atorvastatin (LIPITOR) 40 MG tablet, TAKE 1 TABELT BY MOUTH DAILY IN THE MORNING FOR CHOLESTEROL, Disp: ,  Rfl:    calcitRIOL (ROCALTROL) 0.25 MCG capsule, Take 0.25 mcg by mouth daily., Disp: , Rfl: 0   CARAFATE 1 GM/10ML suspension, SHAKE LQ AND TK 10 ML PO QD B THE EVE MEAL OES (Patient not taking: Reported on 07/04/2019), Disp: , Rfl: 2   clopidogrel (PLAVIX) 75 MG tablet, Take 1 tablet (75 mg total) by mouth daily with breakfast., Disp: 30 tablet, Rfl: 1   Continuous Blood Gluc Receiver (FREESTYLE LIBRE 14 DAY READER) DEVI, , Disp: , Rfl:    Continuous Blood Gluc Sensor (FREESTYLE LIBRE 14 DAY SENSOR) MISC, , Disp: , Rfl:    famotidine (PEPCID) 20 MG tablet, TAKE 2 TABLETS ($RemoveBe'40MG'ohiozokUW$ ) BY MOUTH AT BEDTIME FOR HEARTBURN, Disp: , Rfl:    furosemide (LASIX) 40 MG tablet, Take 40 mg by mouth daily., Disp: , Rfl: 0   glimepiride (AMARYL) 4 MG tablet, Take 4 mg by mouth daily with breakfast., Disp: , Rfl:    telmisartan (MICARDIS) 80 MG tablet, , Disp: , Rfl:    TRULICITY 1.5 LN/9.8XQ SOPN, INJECT 1 SUBCUTANEOUSLY ONCE WEEKLY, Disp: , Rfl:  No Known Allergies Social History   Occupational History   Not on file  Tobacco Use   Smoking status: Never Smoker   Smokeless tobacco: Never Used  Substance and Sexual Activity   Alcohol use: No   Drug use: No   Sexual activity: Not on file    Objective:   Constitutional Daniel Warner  is a pleasant 80 y.o. African American male, WD, WN in NAD.Marland Kitchen AAO x 3.   Vascular Dorsalis pedis pulses palpable bilaterally. Posterior tibial pulses palpable bilaterally. Capillary refill normal to all digits.  No cyanosis or clubbing noted. Pedal hair growth diminished b/l.  Neurologic Normal speech. Oriented to person, place, and time. Epicritic sensation to light touch grossly present bilaterally. Protective sensation intact 5/5 intact bilaterally with 10g monofilament b/l. Proprioception intact bilaterally. Clonus negative b/l.  Dermatologic Pedal skin with normal turgor, texture and tone b/l.  Toenails are discolored, thick, elongated, dystrophic with pain  on palpation x 10.No open wounds. No skin lesions.  Orthopedic: Normal muscle strength 5/5 to all lower extremity muscle groups bilaterally. No pain crepitus or joint limitation noted with ROM b/l. Pes planus deformity noted b/l.  No bony tenderness.    Radiographs: None Assessment:   1. Pain due to onychomycosis of toenails of both feet   2. Pes planus of both feet   3. Type 2 diabetes mellitus with stage 4 chronic kidney disease, without long-term current use of insulin ([place])    Plan:  Patient was evaluated and treated and all questions answered.  Onychomycosis with pain -Nails palliatively debridement as below -Educated on self-care  Procedure: Nail Debridement Rationale: Pain Type of Debridement: manual, sharp debridement. Instrumentation: Nail nipper, rotary burr. Number of Nails: 10 -Will have Dawn check benefits for orthotics. -Continue diabetic foot care principles. -Toenails 1-5 b/l were debrided in length and girth with sterile nail nippers and dremel without iatrogenic bleeding.  -Patient to report any pedal injuries to medical professional immediately. -Patient to call if there is a question/concern in the interim.   Return in about 3 months (around 05/02/2020).  Marzetta Board, DPM

## 2020-02-05 DIAGNOSIS — E1151 Type 2 diabetes mellitus with diabetic peripheral angiopathy without gangrene: Secondary | ICD-10-CM | POA: Diagnosis not present

## 2020-02-05 DIAGNOSIS — N184 Chronic kidney disease, stage 4 (severe): Secondary | ICD-10-CM | POA: Diagnosis not present

## 2020-02-05 DIAGNOSIS — I129 Hypertensive chronic kidney disease with stage 1 through stage 4 chronic kidney disease, or unspecified chronic kidney disease: Secondary | ICD-10-CM | POA: Diagnosis not present

## 2020-02-05 DIAGNOSIS — E1142 Type 2 diabetes mellitus with diabetic polyneuropathy: Secondary | ICD-10-CM | POA: Diagnosis not present

## 2020-02-05 DIAGNOSIS — R600 Localized edema: Secondary | ICD-10-CM | POA: Diagnosis not present

## 2020-02-05 DIAGNOSIS — E1121 Type 2 diabetes mellitus with diabetic nephropathy: Secondary | ICD-10-CM | POA: Diagnosis not present

## 2020-02-05 DIAGNOSIS — Z7984 Long term (current) use of oral hypoglycemic drugs: Secondary | ICD-10-CM | POA: Diagnosis not present

## 2020-02-05 DIAGNOSIS — R1319 Other dysphagia: Secondary | ICD-10-CM | POA: Diagnosis not present

## 2020-02-09 NOTE — Progress Notes (Signed)
I can send an auth to [employer] to try to get authorization for orthotics and go from there. We have had only 1 denial for the orthotics from [employer] that I am aware of

## 2020-02-12 ENCOUNTER — Telehealth: Payer: Self-pay | Admitting: Podiatry

## 2020-02-12 DIAGNOSIS — N184 Chronic kidney disease, stage 4 (severe): Secondary | ICD-10-CM | POA: Diagnosis not present

## 2020-02-12 DIAGNOSIS — Z111 Encounter for screening for respiratory tuberculosis: Secondary | ICD-10-CM | POA: Diagnosis not present

## 2020-02-12 DIAGNOSIS — E1121 Type 2 diabetes mellitus with diabetic nephropathy: Secondary | ICD-10-CM | POA: Diagnosis not present

## 2020-02-12 DIAGNOSIS — E7800 Pure hypercholesterolemia, unspecified: Secondary | ICD-10-CM | POA: Diagnosis not present

## 2020-02-12 DIAGNOSIS — E1151 Type 2 diabetes mellitus with diabetic peripheral angiopathy without gangrene: Secondary | ICD-10-CM | POA: Diagnosis not present

## 2020-02-12 DIAGNOSIS — E1142 Type 2 diabetes mellitus with diabetic polyneuropathy: Secondary | ICD-10-CM | POA: Diagnosis not present

## 2020-02-12 DIAGNOSIS — I129 Hypertensive chronic kidney disease with stage 1 through stage 4 chronic kidney disease, or unspecified chronic kidney disease: Secondary | ICD-10-CM | POA: Diagnosis not present

## 2020-02-12 NOTE — Telephone Encounter (Signed)
pts wife called back and pt is scheduled to see Rick [phone] for orthotics

## 2020-02-12 NOTE — Telephone Encounter (Signed)
We received auth from [employer] for the orthotics.  I called and left message with pts wife to have him call to schedule an appt to get the orthotics.

## 2020-02-13 ENCOUNTER — Ambulatory Visit: Payer: HMO | Admitting: Orthotics

## 2020-02-13 ENCOUNTER — Other Ambulatory Visit: Payer: Self-pay

## 2020-02-13 DIAGNOSIS — M79674 Pain in right toe(s): Secondary | ICD-10-CM

## 2020-02-13 DIAGNOSIS — E1122 Type 2 diabetes mellitus with diabetic chronic kidney disease: Secondary | ICD-10-CM

## 2020-02-13 DIAGNOSIS — B351 Tinea unguium: Secondary | ICD-10-CM

## 2020-02-13 DIAGNOSIS — M2141 Flat foot [pes planus] (acquired), right foot: Secondary | ICD-10-CM

## 2020-02-13 DIAGNOSIS — M2142 Flat foot [pes planus] (acquired), left foot: Secondary | ICD-10-CM

## 2020-02-13 DIAGNOSIS — N184 Chronic kidney disease, stage 4 (severe): Secondary | ICD-10-CM

## 2020-02-13 NOTE — Progress Notes (Signed)

## 2020-02-16 DIAGNOSIS — H2513 Age-related nuclear cataract, bilateral: Secondary | ICD-10-CM | POA: Diagnosis not present

## 2020-02-16 DIAGNOSIS — H401132 Primary open-angle glaucoma, bilateral, moderate stage: Secondary | ICD-10-CM | POA: Diagnosis not present

## 2020-02-16 DIAGNOSIS — H524 Presbyopia: Secondary | ICD-10-CM | POA: Diagnosis not present

## 2020-02-16 DIAGNOSIS — E113393 Type 2 diabetes mellitus with moderate nonproliferative diabetic retinopathy without macular edema, bilateral: Secondary | ICD-10-CM | POA: Diagnosis not present

## 2020-02-26 DIAGNOSIS — R0602 Shortness of breath: Secondary | ICD-10-CM | POA: Diagnosis not present

## 2020-02-26 DIAGNOSIS — J069 Acute upper respiratory infection, unspecified: Secondary | ICD-10-CM | POA: Diagnosis not present

## 2020-02-26 DIAGNOSIS — Z20822 Contact with and (suspected) exposure to covid-19: Secondary | ICD-10-CM | POA: Diagnosis not present

## 2020-02-29 ENCOUNTER — Other Ambulatory Visit: Payer: Self-pay | Admitting: Geriatric Medicine

## 2020-02-29 ENCOUNTER — Ambulatory Visit
Admission: RE | Admit: 2020-02-29 | Discharge: 2020-02-29 | Disposition: A | Discharge status: Home / Self Care | Payer: HMO | Source: Ambulatory Visit | Attending: Geriatric Medicine | Admitting: Geriatric Medicine

## 2020-02-29 ENCOUNTER — Other Ambulatory Visit: Payer: Self-pay

## 2020-02-29 DIAGNOSIS — E1142 Type 2 diabetes mellitus with diabetic polyneuropathy: Secondary | ICD-10-CM | POA: Diagnosis not present

## 2020-02-29 DIAGNOSIS — R0600 Dyspnea, unspecified: Secondary | ICD-10-CM

## 2020-02-29 DIAGNOSIS — Z7984 Long term (current) use of oral hypoglycemic drugs: Secondary | ICD-10-CM | POA: Diagnosis not present

## 2020-02-29 DIAGNOSIS — R0602 Shortness of breath: Secondary | ICD-10-CM | POA: Diagnosis not present

## 2020-02-29 DIAGNOSIS — I129 Hypertensive chronic kidney disease with stage 1 through stage 4 chronic kidney disease, or unspecified chronic kidney disease: Secondary | ICD-10-CM | POA: Diagnosis not present

## 2020-02-29 DIAGNOSIS — E1121 Type 2 diabetes mellitus with diabetic nephropathy: Secondary | ICD-10-CM | POA: Diagnosis not present

## 2020-02-29 DIAGNOSIS — N184 Chronic kidney disease, stage 4 (severe): Secondary | ICD-10-CM | POA: Diagnosis not present

## 2020-03-01 ENCOUNTER — Telehealth: Payer: Self-pay | Admitting: Podiatry

## 2020-03-01 DIAGNOSIS — R0600 Dyspnea, unspecified: Secondary | ICD-10-CM | POA: Diagnosis not present

## 2020-03-01 NOTE — Telephone Encounter (Signed)
pts wife(Daniel Warner) left message asking for a call back about ordering another pair of shoes for pt. Pts sister is asking for possible gift for pt.  I returned call and told pts wife the diabetic shoes by themself are 150.00 and if pt has a pair in mind we could order them for him and they would pay and we would call when they come in for pt to pick up. She said thank you and will let us know .

## 2020-03-11 DIAGNOSIS — E7800 Pure hypercholesterolemia, unspecified: Secondary | ICD-10-CM | POA: Diagnosis not present

## 2020-03-11 DIAGNOSIS — E1121 Type 2 diabetes mellitus with diabetic nephropathy: Secondary | ICD-10-CM | POA: Diagnosis not present

## 2020-03-11 DIAGNOSIS — I129 Hypertensive chronic kidney disease with stage 1 through stage 4 chronic kidney disease, or unspecified chronic kidney disease: Secondary | ICD-10-CM | POA: Diagnosis not present

## 2020-03-11 DIAGNOSIS — E1142 Type 2 diabetes mellitus with diabetic polyneuropathy: Secondary | ICD-10-CM | POA: Diagnosis not present

## 2020-03-11 DIAGNOSIS — N184 Chronic kidney disease, stage 4 (severe): Secondary | ICD-10-CM | POA: Diagnosis not present

## 2020-03-11 DIAGNOSIS — E1151 Type 2 diabetes mellitus with diabetic peripheral angiopathy without gangrene: Secondary | ICD-10-CM | POA: Diagnosis not present

## 2020-03-19 DIAGNOSIS — E7800 Pure hypercholesterolemia, unspecified: Secondary | ICD-10-CM | POA: Diagnosis not present

## 2020-03-19 DIAGNOSIS — I5030 Unspecified diastolic (congestive) heart failure: Secondary | ICD-10-CM | POA: Diagnosis not present

## 2020-03-19 DIAGNOSIS — I129 Hypertensive chronic kidney disease with stage 1 through stage 4 chronic kidney disease, or unspecified chronic kidney disease: Secondary | ICD-10-CM | POA: Diagnosis not present

## 2020-03-19 DIAGNOSIS — N184 Chronic kidney disease, stage 4 (severe): Secondary | ICD-10-CM | POA: Diagnosis not present

## 2020-03-19 DIAGNOSIS — E1142 Type 2 diabetes mellitus with diabetic polyneuropathy: Secondary | ICD-10-CM | POA: Diagnosis not present

## 2020-03-19 DIAGNOSIS — E1121 Type 2 diabetes mellitus with diabetic nephropathy: Secondary | ICD-10-CM | POA: Diagnosis not present

## 2020-03-19 DIAGNOSIS — E1151 Type 2 diabetes mellitus with diabetic peripheral angiopathy without gangrene: Secondary | ICD-10-CM | POA: Diagnosis not present

## 2020-03-20 ENCOUNTER — Ambulatory Visit (INDEPENDENT_AMBULATORY_CARE_PROVIDER_SITE_OTHER): Payer: HMO | Admitting: Podiatrist

## 2020-03-20 ENCOUNTER — Other Ambulatory Visit: Payer: Self-pay

## 2020-03-20 DIAGNOSIS — M2141 Flat foot [pes planus] (acquired), right foot: Secondary | ICD-10-CM

## 2020-03-20 DIAGNOSIS — M2142 Flat foot [pes planus] (acquired), left foot: Secondary | ICD-10-CM

## 2020-03-20 DIAGNOSIS — N184 Chronic kidney disease, stage 4 (severe): Secondary | ICD-10-CM

## 2020-03-20 DIAGNOSIS — E1122 Type 2 diabetes mellitus with diabetic chronic kidney disease: Secondary | ICD-10-CM

## 2020-03-20 NOTE — Progress Notes (Signed)
Patient presents to pick up diabetic shoes-  The shoes 1252m are too tight in the midfoot region and too short for the inserts.  We ordered a different shoe (B3000) in size 10.5 xwide.   We will notify him when shoes are in and ready to be picked up.

## 2020-03-21 DIAGNOSIS — E1151 Type 2 diabetes mellitus with diabetic peripheral angiopathy without gangrene: Secondary | ICD-10-CM | POA: Diagnosis not present

## 2020-03-21 DIAGNOSIS — N184 Chronic kidney disease, stage 4 (severe): Secondary | ICD-10-CM | POA: Diagnosis not present

## 2020-03-21 DIAGNOSIS — I5030 Unspecified diastolic (congestive) heart failure: Secondary | ICD-10-CM | POA: Diagnosis not present

## 2020-03-21 DIAGNOSIS — I129 Hypertensive chronic kidney disease with stage 1 through stage 4 chronic kidney disease, or unspecified chronic kidney disease: Secondary | ICD-10-CM | POA: Diagnosis not present

## 2020-04-01 ENCOUNTER — Ambulatory Visit (INDEPENDENT_AMBULATORY_CARE_PROVIDER_SITE_OTHER): Payer: HMO | Admitting: *Deleted

## 2020-04-01 ENCOUNTER — Other Ambulatory Visit: Payer: Self-pay

## 2020-04-01 DIAGNOSIS — E1122 Type 2 diabetes mellitus with diabetic chronic kidney disease: Secondary | ICD-10-CM

## 2020-04-01 DIAGNOSIS — M2142 Flat foot [pes planus] (acquired), left foot: Secondary | ICD-10-CM

## 2020-04-01 DIAGNOSIS — M2141 Flat foot [pes planus] (acquired), right foot: Secondary | ICD-10-CM | POA: Diagnosis not present

## 2020-04-01 DIAGNOSIS — N184 Chronic kidney disease, stage 4 (severe): Secondary | ICD-10-CM

## 2020-04-01 NOTE — Progress Notes (Signed)
Patient presents today to pick up diabetic shoes and insoles.  Patient was dispensed 1pair diabetic shoes and 3 pairs of foam casted diabetic insoles. Fit was satisfactory. Instructions for break-in and wear was reviewed and a copy was given to the patient.   Re-appointment for regularly scheduled diabetic foot care visits or if they should experience any trouble with the shoes or insoles.

## 2020-04-03 DIAGNOSIS — I5030 Unspecified diastolic (congestive) heart failure: Secondary | ICD-10-CM | POA: Diagnosis not present

## 2020-04-03 DIAGNOSIS — N184 Chronic kidney disease, stage 4 (severe): Secondary | ICD-10-CM | POA: Diagnosis not present

## 2020-04-03 DIAGNOSIS — I129 Hypertensive chronic kidney disease with stage 1 through stage 4 chronic kidney disease, or unspecified chronic kidney disease: Secondary | ICD-10-CM | POA: Diagnosis not present

## 2020-04-12 DIAGNOSIS — N184 Chronic kidney disease, stage 4 (severe): Secondary | ICD-10-CM | POA: Diagnosis not present

## 2020-04-12 DIAGNOSIS — E1142 Type 2 diabetes mellitus with diabetic polyneuropathy: Secondary | ICD-10-CM | POA: Diagnosis not present

## 2020-04-12 DIAGNOSIS — E1151 Type 2 diabetes mellitus with diabetic peripheral angiopathy without gangrene: Secondary | ICD-10-CM | POA: Diagnosis not present

## 2020-04-12 DIAGNOSIS — I129 Hypertensive chronic kidney disease with stage 1 through stage 4 chronic kidney disease, or unspecified chronic kidney disease: Secondary | ICD-10-CM | POA: Diagnosis not present

## 2020-04-12 DIAGNOSIS — E1121 Type 2 diabetes mellitus with diabetic nephropathy: Secondary | ICD-10-CM | POA: Diagnosis not present

## 2020-04-12 DIAGNOSIS — I5030 Unspecified diastolic (congestive) heart failure: Secondary | ICD-10-CM | POA: Diagnosis not present

## 2020-04-12 DIAGNOSIS — E7800 Pure hypercholesterolemia, unspecified: Secondary | ICD-10-CM | POA: Diagnosis not present

## 2020-05-15 ENCOUNTER — Ambulatory Visit: Payer: HMO | Admitting: *Deleted

## 2020-05-15 DIAGNOSIS — I739 Peripheral vascular disease, unspecified: Secondary | ICD-10-CM | POA: Diagnosis not present

## 2020-05-15 DIAGNOSIS — N2581 Secondary hyperparathyroidism of renal origin: Secondary | ICD-10-CM | POA: Diagnosis not present

## 2020-05-15 DIAGNOSIS — E1122 Type 2 diabetes mellitus with diabetic chronic kidney disease: Secondary | ICD-10-CM | POA: Diagnosis not present

## 2020-05-15 DIAGNOSIS — D631 Anemia in chronic kidney disease: Secondary | ICD-10-CM | POA: Diagnosis not present

## 2020-05-15 DIAGNOSIS — I2510 Atherosclerotic heart disease of native coronary artery without angina pectoris: Secondary | ICD-10-CM | POA: Diagnosis not present

## 2020-05-15 DIAGNOSIS — N184 Chronic kidney disease, stage 4 (severe): Secondary | ICD-10-CM | POA: Diagnosis not present

## 2020-05-15 DIAGNOSIS — I129 Hypertensive chronic kidney disease with stage 1 through stage 4 chronic kidney disease, or unspecified chronic kidney disease: Secondary | ICD-10-CM | POA: Diagnosis not present

## 2020-05-21 ENCOUNTER — Ambulatory Visit: Payer: HMO | Admitting: Podiatry

## 2020-05-21 ENCOUNTER — Encounter: Payer: Self-pay | Admitting: Podiatry

## 2020-05-21 ENCOUNTER — Other Ambulatory Visit: Payer: Self-pay

## 2020-05-21 DIAGNOSIS — M79675 Pain in left toe(s): Secondary | ICD-10-CM | POA: Diagnosis not present

## 2020-05-21 DIAGNOSIS — B351 Tinea unguium: Secondary | ICD-10-CM

## 2020-05-21 DIAGNOSIS — M79674 Pain in right toe(s): Secondary | ICD-10-CM | POA: Diagnosis not present

## 2020-05-21 DIAGNOSIS — I700 Atherosclerosis of aorta: Secondary | ICD-10-CM | POA: Insufficient documentation

## 2020-05-21 DIAGNOSIS — D649 Anemia, unspecified: Secondary | ICD-10-CM | POA: Insufficient documentation

## 2020-05-21 DIAGNOSIS — E1121 Type 2 diabetes mellitus with diabetic nephropathy: Secondary | ICD-10-CM | POA: Insufficient documentation

## 2020-05-21 DIAGNOSIS — E7800 Pure hypercholesterolemia, unspecified: Secondary | ICD-10-CM | POA: Insufficient documentation

## 2020-05-21 DIAGNOSIS — N184 Chronic kidney disease, stage 4 (severe): Secondary | ICD-10-CM | POA: Diagnosis not present

## 2020-05-21 DIAGNOSIS — M2142 Flat foot [pes planus] (acquired), left foot: Secondary | ICD-10-CM

## 2020-05-21 DIAGNOSIS — E1122 Type 2 diabetes mellitus with diabetic chronic kidney disease: Secondary | ICD-10-CM | POA: Diagnosis not present

## 2020-05-21 DIAGNOSIS — M2141 Flat foot [pes planus] (acquired), right foot: Secondary | ICD-10-CM

## 2020-05-21 DIAGNOSIS — K222 Esophageal obstruction: Secondary | ICD-10-CM | POA: Insufficient documentation

## 2020-05-21 DIAGNOSIS — E1142 Type 2 diabetes mellitus with diabetic polyneuropathy: Secondary | ICD-10-CM | POA: Insufficient documentation

## 2020-05-21 DIAGNOSIS — I5032 Chronic diastolic (congestive) heart failure: Secondary | ICD-10-CM | POA: Insufficient documentation

## 2020-05-23 DIAGNOSIS — N184 Chronic kidney disease, stage 4 (severe): Secondary | ICD-10-CM | POA: Diagnosis not present

## 2020-05-23 DIAGNOSIS — I129 Hypertensive chronic kidney disease with stage 1 through stage 4 chronic kidney disease, or unspecified chronic kidney disease: Secondary | ICD-10-CM | POA: Diagnosis not present

## 2020-05-26 NOTE — Progress Notes (Signed)
  Subjective:  Patient ID: Daniel Warner, male    DOB: 1940-03-15,  MRN: [id number]  80 y.o. male presents at risk foot care. Pt has h/o NIDDM with chronic kidney disease and painful thick toenails that are difficult to trim. Pain interferes with ambulation. Aggravating factors include wearing enclosed shoe gear. Pain is relieved with periodic professional debridement.   He voices no new pedal problems on today's visit.  PCP is Dr. Lajean Manes and last visit was 05/14/2020.  Allergies  Allergen Reactions  . Losartan Potassium     Other reaction(s): itching and hand swelling  . Lisinopril     Other reaction(s): Cough    Review of Systems: Negative except as noted in the HPI.   Objective:   Constitutional Pt is a pleasant 80 y.o. African American male in NAD. AAO x 3.   Vascular Capillary refill time to digits immediate b/l. Palpable pedal pulses b/l LE. Pedal hair absent. Lower extremity skin temperature gradient within normal limits.  Neurologic Protective sensation intact 5/5 intact bilaterally with 10g monofilament b/l. Vibratory sensation intact b/l.  Dermatologic Pedal skin with normal turgor, texture and tone bilaterally. No open wounds bilaterally. No interdigital macerations bilaterally. Toenails 1-5 b/l elongated, discolored, dystrophic, thickened, crumbly with subungual debris and tenderness to dorsal palpation.  Orthopedic: Normal muscle strength 5/5 to all lower extremity muscle groups bilaterally. No pain crepitus or joint limitation noted with ROM b/l. Pes planus deformity noted b/l.    Radiographs: None Assessment:   1. Pain due to onychomycosis of toenails of both feet   2. Pes planus of both feet   3. Type 2 diabetes mellitus with stage 4 chronic kidney disease, without long-term current use of insulin ([place])    Plan:  Patient was evaluated and treated and all questions answered.  Onychomycosis with pain -Nails palliatively debridement as below. -Educated on  self-care  Procedure: Nail Debridement Rationale: Pain Type of Debridement: manual, sharp debridement. Instrumentation: Nail nipper, rotary burr. Number of Nails: 10  -Examined patient. -Continue diabetic foot care principles. -Patient to continue soft, supportive shoe gear daily. -Toenails 1-5 b/l were debrided in length and girth with sterile nail nippers and dremel without iatrogenic bleeding.  -Patient to report any pedal injuries to medical professional immediately. -Patient/POA to call should there be question/concern in the interim.  Return in about 3 months (around 08/21/2020).  Marzetta Board, DPM

## 2020-06-11 DIAGNOSIS — E1142 Type 2 diabetes mellitus with diabetic polyneuropathy: Secondary | ICD-10-CM | POA: Diagnosis not present

## 2020-06-11 DIAGNOSIS — E1121 Type 2 diabetes mellitus with diabetic nephropathy: Secondary | ICD-10-CM | POA: Diagnosis not present

## 2020-06-11 DIAGNOSIS — E1151 Type 2 diabetes mellitus with diabetic peripheral angiopathy without gangrene: Secondary | ICD-10-CM | POA: Diagnosis not present

## 2020-06-11 DIAGNOSIS — I5030 Unspecified diastolic (congestive) heart failure: Secondary | ICD-10-CM | POA: Diagnosis not present

## 2020-06-11 DIAGNOSIS — E7800 Pure hypercholesterolemia, unspecified: Secondary | ICD-10-CM | POA: Diagnosis not present

## 2020-06-11 DIAGNOSIS — I129 Hypertensive chronic kidney disease with stage 1 through stage 4 chronic kidney disease, or unspecified chronic kidney disease: Secondary | ICD-10-CM | POA: Diagnosis not present

## 2020-06-11 DIAGNOSIS — N184 Chronic kidney disease, stage 4 (severe): Secondary | ICD-10-CM | POA: Diagnosis not present

## 2020-07-09 DIAGNOSIS — E7800 Pure hypercholesterolemia, unspecified: Secondary | ICD-10-CM | POA: Diagnosis not present

## 2020-07-09 DIAGNOSIS — I129 Hypertensive chronic kidney disease with stage 1 through stage 4 chronic kidney disease, or unspecified chronic kidney disease: Secondary | ICD-10-CM | POA: Diagnosis not present

## 2020-07-09 DIAGNOSIS — I5030 Unspecified diastolic (congestive) heart failure: Secondary | ICD-10-CM | POA: Diagnosis not present

## 2020-07-09 DIAGNOSIS — E1121 Type 2 diabetes mellitus with diabetic nephropathy: Secondary | ICD-10-CM | POA: Diagnosis not present

## 2020-07-09 DIAGNOSIS — E1142 Type 2 diabetes mellitus with diabetic polyneuropathy: Secondary | ICD-10-CM | POA: Diagnosis not present

## 2020-07-09 DIAGNOSIS — N184 Chronic kidney disease, stage 4 (severe): Secondary | ICD-10-CM | POA: Diagnosis not present

## 2020-07-09 DIAGNOSIS — E1151 Type 2 diabetes mellitus with diabetic peripheral angiopathy without gangrene: Secondary | ICD-10-CM | POA: Diagnosis not present

## 2020-08-08 DIAGNOSIS — E1121 Type 2 diabetes mellitus with diabetic nephropathy: Secondary | ICD-10-CM | POA: Diagnosis not present

## 2020-08-08 DIAGNOSIS — E1151 Type 2 diabetes mellitus with diabetic peripheral angiopathy without gangrene: Secondary | ICD-10-CM | POA: Diagnosis not present

## 2020-08-08 DIAGNOSIS — E7800 Pure hypercholesterolemia, unspecified: Secondary | ICD-10-CM | POA: Diagnosis not present

## 2020-08-08 DIAGNOSIS — N184 Chronic kidney disease, stage 4 (severe): Secondary | ICD-10-CM | POA: Diagnosis not present

## 2020-08-08 DIAGNOSIS — I5030 Unspecified diastolic (congestive) heart failure: Secondary | ICD-10-CM | POA: Diagnosis not present

## 2020-08-08 DIAGNOSIS — I129 Hypertensive chronic kidney disease with stage 1 through stage 4 chronic kidney disease, or unspecified chronic kidney disease: Secondary | ICD-10-CM | POA: Diagnosis not present

## 2020-08-08 DIAGNOSIS — E1142 Type 2 diabetes mellitus with diabetic polyneuropathy: Secondary | ICD-10-CM | POA: Diagnosis not present

## 2020-08-20 DIAGNOSIS — H401132 Primary open-angle glaucoma, bilateral, moderate stage: Secondary | ICD-10-CM | POA: Diagnosis not present

## 2020-08-21 ENCOUNTER — Other Ambulatory Visit: Payer: Self-pay

## 2020-08-21 ENCOUNTER — Ambulatory Visit: Payer: HMO | Admitting: Podiatry

## 2020-08-21 ENCOUNTER — Encounter: Payer: Self-pay | Admitting: Podiatry

## 2020-08-21 DIAGNOSIS — M79674 Pain in right toe(s): Secondary | ICD-10-CM | POA: Diagnosis not present

## 2020-08-21 DIAGNOSIS — E1122 Type 2 diabetes mellitus with diabetic chronic kidney disease: Secondary | ICD-10-CM

## 2020-08-21 DIAGNOSIS — B351 Tinea unguium: Secondary | ICD-10-CM

## 2020-08-21 DIAGNOSIS — M79675 Pain in left toe(s): Secondary | ICD-10-CM

## 2020-08-21 DIAGNOSIS — N184 Chronic kidney disease, stage 4 (severe): Secondary | ICD-10-CM | POA: Diagnosis not present

## 2020-08-23 DIAGNOSIS — E1121 Type 2 diabetes mellitus with diabetic nephropathy: Secondary | ICD-10-CM | POA: Diagnosis not present

## 2020-08-23 DIAGNOSIS — Z1389 Encounter for screening for other disorder: Secondary | ICD-10-CM | POA: Diagnosis not present

## 2020-08-23 DIAGNOSIS — E1151 Type 2 diabetes mellitus with diabetic peripheral angiopathy without gangrene: Secondary | ICD-10-CM | POA: Diagnosis not present

## 2020-08-23 DIAGNOSIS — Z0000 Encounter for general adult medical examination without abnormal findings: Secondary | ICD-10-CM | POA: Diagnosis not present

## 2020-08-23 DIAGNOSIS — E7800 Pure hypercholesterolemia, unspecified: Secondary | ICD-10-CM | POA: Diagnosis not present

## 2020-08-23 DIAGNOSIS — I5030 Unspecified diastolic (congestive) heart failure: Secondary | ICD-10-CM | POA: Diagnosis not present

## 2020-08-23 DIAGNOSIS — I700 Atherosclerosis of aorta: Secondary | ICD-10-CM | POA: Diagnosis not present

## 2020-08-23 DIAGNOSIS — Z79899 Other long term (current) drug therapy: Secondary | ICD-10-CM | POA: Diagnosis not present

## 2020-08-23 DIAGNOSIS — N184 Chronic kidney disease, stage 4 (severe): Secondary | ICD-10-CM | POA: Diagnosis not present

## 2020-08-23 DIAGNOSIS — E1142 Type 2 diabetes mellitus with diabetic polyneuropathy: Secondary | ICD-10-CM | POA: Diagnosis not present

## 2020-08-23 DIAGNOSIS — I129 Hypertensive chronic kidney disease with stage 1 through stage 4 chronic kidney disease, or unspecified chronic kidney disease: Secondary | ICD-10-CM | POA: Diagnosis not present

## 2020-08-25 NOTE — Progress Notes (Signed)
Subjective: Daniel Warner is a pleasant 80 y.o. male patient seen today painful thick toenails that are difficult to trim. Pain interferes with ambulation. Aggravating factors include wearing enclosed shoe gear. Pain is relieved with periodic professional debridement.  He is diabetic and states his blood glucose was 156 mg/dl on today. He voices no new pedal problems on today's visit.  PCP is Lajean Manes, MD. Last visit was: 2-3 months ago.  Allergies  Allergen Reactions   Losartan Potassium     Other reaction(s): itching and hand swelling   Lisinopril     Other reaction(s): Cough    Objective: Physical Exam  General: Daniel Warner is a pleasant 80 y.o. African American male, in NAD. AAO x 3.   Vascular:  Capillary refill time to digits immediate b/l. Palpable DP pulse(s) b/l lower extremities Palpable PT pulse(s) b/l lower extremities Pedal hair absent. Lower extremity skin temperature gradient within normal limits.  Dermatological:  Pedal skin with normal turgor, texture and tone b/l lower extremities. No open wounds b/l lower extremities. No interdigital macerations b/l lower extremities. Toenails 1-5 b/l elongated, discolored, dystrophic, thickened, crumbly with subungual debris and tenderness to dorsal palpation.  Musculoskeletal:  Normal muscle strength 5/5 to all lower extremity muscle groups bilaterally. Pes planus deformity noted b/l lower extremities.  Neurological:  Protective sensation intact 5/5 intact bilaterally with 10g monofilament b/l. Vibratory sensation intact b/l.  Assessment and Plan:  1. Pain due to onychomycosis of toenails of both feet   2. Type 2 diabetes mellitus with stage 4 chronic kidney disease, without long-term current use of insulin ([place])      -Examined patient. -No new findings. No new orders. -Continue diabetic foot care principles: inspect feet daily, monitor glucose as recommended by PCP and/or Endocrinologist, and follow  prescribed diet per PCP, Endocrinologist and/or dietician. -Patient to continue soft, supportive shoe gear daily. -Toenails 1-5 b/l were debrided in length and girth with sterile nail nippers and dremel without iatrogenic bleeding.  -Patient to report any pedal injuries to medical professional immediately. -Patient/POA to call should there be question/concern in the interim.  Return in about 3 months (around 11/21/2020).  Marzetta Board, DPM

## 2020-09-10 DIAGNOSIS — I5030 Unspecified diastolic (congestive) heart failure: Secondary | ICD-10-CM | POA: Diagnosis not present

## 2020-09-10 DIAGNOSIS — E7800 Pure hypercholesterolemia, unspecified: Secondary | ICD-10-CM | POA: Diagnosis not present

## 2020-09-10 DIAGNOSIS — E1151 Type 2 diabetes mellitus with diabetic peripheral angiopathy without gangrene: Secondary | ICD-10-CM | POA: Diagnosis not present

## 2020-09-10 DIAGNOSIS — N184 Chronic kidney disease, stage 4 (severe): Secondary | ICD-10-CM | POA: Diagnosis not present

## 2020-09-10 DIAGNOSIS — E1142 Type 2 diabetes mellitus with diabetic polyneuropathy: Secondary | ICD-10-CM | POA: Diagnosis not present

## 2020-09-10 DIAGNOSIS — E1121 Type 2 diabetes mellitus with diabetic nephropathy: Secondary | ICD-10-CM | POA: Diagnosis not present

## 2020-09-10 DIAGNOSIS — I129 Hypertensive chronic kidney disease with stage 1 through stage 4 chronic kidney disease, or unspecified chronic kidney disease: Secondary | ICD-10-CM | POA: Diagnosis not present

## 2020-10-08 DIAGNOSIS — E7800 Pure hypercholesterolemia, unspecified: Secondary | ICD-10-CM | POA: Diagnosis not present

## 2020-10-08 DIAGNOSIS — N189 Chronic kidney disease, unspecified: Secondary | ICD-10-CM | POA: Diagnosis not present

## 2020-10-08 DIAGNOSIS — D631 Anemia in chronic kidney disease: Secondary | ICD-10-CM | POA: Diagnosis not present

## 2020-10-08 DIAGNOSIS — E1122 Type 2 diabetes mellitus with diabetic chronic kidney disease: Secondary | ICD-10-CM | POA: Diagnosis not present

## 2020-10-08 DIAGNOSIS — E1142 Type 2 diabetes mellitus with diabetic polyneuropathy: Secondary | ICD-10-CM | POA: Diagnosis not present

## 2020-10-08 DIAGNOSIS — N184 Chronic kidney disease, stage 4 (severe): Secondary | ICD-10-CM | POA: Diagnosis not present

## 2020-10-08 DIAGNOSIS — E1151 Type 2 diabetes mellitus with diabetic peripheral angiopathy without gangrene: Secondary | ICD-10-CM | POA: Diagnosis not present

## 2020-10-08 DIAGNOSIS — K219 Gastro-esophageal reflux disease without esophagitis: Secondary | ICD-10-CM | POA: Diagnosis not present

## 2020-10-08 DIAGNOSIS — I129 Hypertensive chronic kidney disease with stage 1 through stage 4 chronic kidney disease, or unspecified chronic kidney disease: Secondary | ICD-10-CM | POA: Diagnosis not present

## 2020-10-08 DIAGNOSIS — Z0000 Encounter for general adult medical examination without abnormal findings: Secondary | ICD-10-CM | POA: Diagnosis not present

## 2020-10-08 DIAGNOSIS — E1121 Type 2 diabetes mellitus with diabetic nephropathy: Secondary | ICD-10-CM | POA: Diagnosis not present

## 2020-10-08 DIAGNOSIS — I2510 Atherosclerotic heart disease of native coronary artery without angina pectoris: Secondary | ICD-10-CM | POA: Diagnosis not present

## 2020-10-08 DIAGNOSIS — I5030 Unspecified diastolic (congestive) heart failure: Secondary | ICD-10-CM | POA: Diagnosis not present

## 2020-10-08 DIAGNOSIS — E785 Hyperlipidemia, unspecified: Secondary | ICD-10-CM | POA: Diagnosis not present

## 2020-10-08 DIAGNOSIS — N2581 Secondary hyperparathyroidism of renal origin: Secondary | ICD-10-CM | POA: Diagnosis not present

## 2020-10-08 DIAGNOSIS — I739 Peripheral vascular disease, unspecified: Secondary | ICD-10-CM | POA: Diagnosis not present

## 2020-11-05 DIAGNOSIS — N184 Chronic kidney disease, stage 4 (severe): Secondary | ICD-10-CM | POA: Diagnosis not present

## 2020-11-05 DIAGNOSIS — E1121 Type 2 diabetes mellitus with diabetic nephropathy: Secondary | ICD-10-CM | POA: Diagnosis not present

## 2020-11-05 DIAGNOSIS — E7800 Pure hypercholesterolemia, unspecified: Secondary | ICD-10-CM | POA: Diagnosis not present

## 2020-11-05 DIAGNOSIS — I129 Hypertensive chronic kidney disease with stage 1 through stage 4 chronic kidney disease, or unspecified chronic kidney disease: Secondary | ICD-10-CM | POA: Diagnosis not present

## 2020-11-05 DIAGNOSIS — E1151 Type 2 diabetes mellitus with diabetic peripheral angiopathy without gangrene: Secondary | ICD-10-CM | POA: Diagnosis not present

## 2020-11-05 DIAGNOSIS — I5030 Unspecified diastolic (congestive) heart failure: Secondary | ICD-10-CM | POA: Diagnosis not present

## 2020-11-05 DIAGNOSIS — E1142 Type 2 diabetes mellitus with diabetic polyneuropathy: Secondary | ICD-10-CM | POA: Diagnosis not present

## 2020-11-18 DIAGNOSIS — I129 Hypertensive chronic kidney disease with stage 1 through stage 4 chronic kidney disease, or unspecified chronic kidney disease: Secondary | ICD-10-CM | POA: Diagnosis not present

## 2020-11-18 DIAGNOSIS — G4733 Obstructive sleep apnea (adult) (pediatric): Secondary | ICD-10-CM | POA: Diagnosis not present

## 2020-11-18 DIAGNOSIS — N184 Chronic kidney disease, stage 4 (severe): Secondary | ICD-10-CM | POA: Diagnosis not present

## 2020-11-18 DIAGNOSIS — E1151 Type 2 diabetes mellitus with diabetic peripheral angiopathy without gangrene: Secondary | ICD-10-CM | POA: Diagnosis not present

## 2020-11-18 DIAGNOSIS — Z23 Encounter for immunization: Secondary | ICD-10-CM | POA: Diagnosis not present

## 2020-11-27 ENCOUNTER — Ambulatory Visit: Payer: HMO | Admitting: Podiatry

## 2020-11-27 ENCOUNTER — Other Ambulatory Visit: Payer: Self-pay

## 2020-11-27 DIAGNOSIS — M79675 Pain in left toe(s): Secondary | ICD-10-CM | POA: Diagnosis not present

## 2020-11-27 DIAGNOSIS — N184 Chronic kidney disease, stage 4 (severe): Secondary | ICD-10-CM

## 2020-11-27 DIAGNOSIS — B351 Tinea unguium: Secondary | ICD-10-CM | POA: Diagnosis not present

## 2020-11-27 DIAGNOSIS — E1122 Type 2 diabetes mellitus with diabetic chronic kidney disease: Secondary | ICD-10-CM | POA: Diagnosis not present

## 2020-11-27 DIAGNOSIS — E119 Type 2 diabetes mellitus without complications: Secondary | ICD-10-CM

## 2020-11-27 DIAGNOSIS — M79674 Pain in right toe(s): Secondary | ICD-10-CM

## 2020-11-27 DIAGNOSIS — M2141 Flat foot [pes planus] (acquired), right foot: Secondary | ICD-10-CM

## 2020-11-27 DIAGNOSIS — M2142 Flat foot [pes planus] (acquired), left foot: Secondary | ICD-10-CM

## 2020-12-01 ENCOUNTER — Encounter: Payer: Self-pay | Admitting: Podiatry

## 2020-12-01 NOTE — Progress Notes (Signed)
ANNUAL DIABETIC FOOT EXAM  Subjective: Daniel Warner presents today for for annual diabetic foot examination.  Patient relates 10 year h/o diabetes.  Patient denies any h/o foot wounds.  Patient endorses symptoms of foot numbness.  Patient's blood sugar was 118 mg/dl today.   Lajean Manes, MD is patient's PCP. Last visit was 11/18/2020.  Past Medical History:  Diagnosis Date   Benign hypertension with chronic kidney disease, stage III (HCC)    Carotid artery disease (HCC)    CKD (chronic kidney disease), stage III (HCC)    Diabetes mellitus type 2 with peripheral artery disease ([place])    Diabetes mellitus without complication ([place])    Discolored skin    Right leg, thinks it's from a bag that has rubbed against his leg for so long. Doppler 12/2010   GERD (gastroesophageal reflux disease)    Hypercholesterolemia    Hyperlipidemia    Numbness of toes    In the morning   Obesity    Peripheral arterial disease ([place])    Patient Active Problem List   Diagnosis Date Noted   Anemia 05/21/2020   Chronic diastolic heart failure ([place]) 05/21/2020   Diabetic nephropathy ([place]) 05/21/2020   Hardening of the aorta (main artery of the heart) ([place]) 05/21/2020   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[place]) 05/21/2020   Pure hypercholesterolemia 05/21/2020   Stricture and stenosis of esophagus 05/21/2020   Diabetes ([place]) 04/08/2018   OSA (obstructive sleep apnea) 04/08/2018   Chronic kidney disease (CKD), stage IV (severe) (HCC) 04/08/2018   GERD (gastroesophageal reflux disease) 04/08/2018   PAD (peripheral artery disease) ([place]) 10/16/2014   Essential hypertension 04/11/2013   Mixed hyperlipidemia 04/11/2013   Occlusion and stenosis of carotid artery without mention of cerebral infarction 04/11/2013   Obesity, unspecified 04/11/2013   Nonspecific abnormal electrocardiogram (ECG) (EKG) 04/11/2013   Past Surgical History:  Procedure Laterality Date   PERIPHERAL VASCULAR  CATHETERIZATION N/A 10/16/2014   Procedure: Abdominal Aortogram w/Lower Extremity;  Surgeon: Serafina Mitchell, MD;  Location: [facility];  Service: Cardiovascular;  Laterality: N/A;   TONSILLECTOMY     WISDOM TOOTH EXTRACTION     Current Outpatient Medications on File Prior to Visit  Medication Sig Dispense Refill   ALLERGY RELIEF 10 MG tablet 1 TABLET BY MOUTH ONCE DAILY.     amLODipine (NORVASC) 10 MG tablet Take 10 mg by mouth every morning.     atorvastatin (LIPITOR) 20 MG tablet Take 20 mg by mouth daily. (Patient not taking: Reported on 07/04/2019)  11   atorvastatin (LIPITOR) 40 MG tablet TAKE 1 TABELT BY MOUTH DAILY IN THE MORNING FOR CHOLESTEROL     atorvastatin (LIPITOR) 80 MG tablet Take 80 mg by mouth every morning.     calcitRIOL (ROCALTROL) 0.25 MCG capsule Take 0.25 mcg by mouth daily.  0   CARAFATE 1 GM/10ML suspension SHAKE LQ AND TK 10 ML PO QD B THE EVE MEAL OES (Patient not taking: Reported on 07/04/2019)  2   clopidogrel (PLAVIX) 75 MG tablet Take 1 tablet (75 mg total) by mouth daily with breakfast. 30 tablet 1   Continuous Blood Gluc Receiver (FREESTYLE LIBRE 14 DAY READER) DEVI      Continuous Blood Gluc Sensor (FREESTYLE LIBRE 14 DAY SENSOR) MISC      famotidine (PEPCID) 20 MG tablet TAKE 2 TABLETS ($RemoveBe'40MG'EScYpJtMw$ ) BY MOUTH AT BEDTIME FOR HEARTBURN     furosemide (LASIX) 20 MG tablet Take 20 mg by mouth 2 (two)  times daily.     glimepiride (AMARYL) 4 MG tablet Take 4 mg by mouth daily with breakfast.     levofloxacin (LEVAQUIN) 250 MG tablet Take by mouth.     telmisartan (MICARDIS) 80 MG tablet      TRULICITY 1.5 MW/4.1LK SOPN INJECT 1 SUBCUTANEOUSLY ONCE WEEKLY     No current facility-administered medications on file prior to visit.    Allergies  Allergen Reactions   Losartan Potassium     Other reaction(s): itching and hand swelling   Lisinopril     Other reaction(s): Cough   Social History   Occupational History   Not on file  Tobacco Use   Smoking status:  Never   Smokeless tobacco: Never  Substance and Sexual Activity   Alcohol use: No   Drug use: No   Sexual activity: Not on file   Family History  Problem Relation Age of Onset   Stroke Father    Irritable bowel syndrome Mother    Heart disease Paternal Aunt        CABG   Heart disease Cousin        CABG    There is no immunization history on file for this patient.   Review of Systems: Negative except as noted in the HPI.   Objective: There were no vitals filed for this visit.  Daniel Warner is a pleasant 80 y.o. male in NAD. AAO X 3.  Vascular Examination: CFT <3 seconds b/l LE. Faintly palpable DP pulses b/l LE. Faintly palpable PT pulse(s) b/l LE. Pedal hair absent. No pain with calf compression RLE. +1 pitting edema noted BLE. No cyanosis or clubbing noted b/l LE.  Dermatological Examination: Pedal skin is warm and supple b/l LE. No open wounds b/l LE. No interdigital macerations noted b/l LE. Toenails 1-5 b/l elongated, discolored, dystrophic, thickened, crumbly with subungual debris and tenderness to dorsal palpation. No hyperkeratotic nor porokeratotic lesions present on today's visit.  Musculoskeletal Examination: Muscle strength 5/5 to all lower extremity muscle groups bilaterally. Pes planus deformity noted bilateral LE.  Footwear Assessment: Does the patient wear appropriate shoes? Yes. Does the patient need inserts/orthotics? Yes.  Neurological Examination: Protective sensation intact 5/5 intact bilaterally with 10g monofilament b/l. Vibratory sensation intact b/l.  Assessment: 1. Pain due to onychomycosis of toenails of both feet   2. Pes planus of both feet   3. Type 2 diabetes mellitus with stage 4 chronic kidney disease, without long-term current use of insulin ([place])   4. Encounter for diabetic foot exam ([place])     ADA Risk Categorization: Low Risk :  Patient has all of the following: Intact protective sensation No prior foot ulcer  No severe  deformity Pedal pulses present  Plan: -Examined patient. -Diabetic foot examination performed today. -Continue foot and shoe inspections daily. Monitor blood glucose per PCP/Endocrinologist's recommendations. -Mycotic toenails 1-5 bilaterally were debrided in length and girth with sterile nail nippers and dremel without incident. -Patient/POA to call should there be question/concern in the interim.  Return in about 3 months (around 02/27/2021).  Marzetta Board, DPM

## 2020-12-10 DIAGNOSIS — E7800 Pure hypercholesterolemia, unspecified: Secondary | ICD-10-CM | POA: Diagnosis not present

## 2020-12-10 DIAGNOSIS — E1151 Type 2 diabetes mellitus with diabetic peripheral angiopathy without gangrene: Secondary | ICD-10-CM | POA: Diagnosis not present

## 2020-12-10 DIAGNOSIS — E1142 Type 2 diabetes mellitus with diabetic polyneuropathy: Secondary | ICD-10-CM | POA: Diagnosis not present

## 2020-12-10 DIAGNOSIS — N184 Chronic kidney disease, stage 4 (severe): Secondary | ICD-10-CM | POA: Diagnosis not present

## 2020-12-10 DIAGNOSIS — E1121 Type 2 diabetes mellitus with diabetic nephropathy: Secondary | ICD-10-CM | POA: Diagnosis not present

## 2020-12-10 DIAGNOSIS — I129 Hypertensive chronic kidney disease with stage 1 through stage 4 chronic kidney disease, or unspecified chronic kidney disease: Secondary | ICD-10-CM | POA: Diagnosis not present

## 2020-12-10 DIAGNOSIS — I5030 Unspecified diastolic (congestive) heart failure: Secondary | ICD-10-CM | POA: Diagnosis not present

## 2021-01-10 DIAGNOSIS — E1121 Type 2 diabetes mellitus with diabetic nephropathy: Secondary | ICD-10-CM | POA: Diagnosis not present

## 2021-01-10 DIAGNOSIS — N184 Chronic kidney disease, stage 4 (severe): Secondary | ICD-10-CM | POA: Diagnosis not present

## 2021-01-10 DIAGNOSIS — E1151 Type 2 diabetes mellitus with diabetic peripheral angiopathy without gangrene: Secondary | ICD-10-CM | POA: Diagnosis not present

## 2021-01-10 DIAGNOSIS — E1142 Type 2 diabetes mellitus with diabetic polyneuropathy: Secondary | ICD-10-CM | POA: Diagnosis not present

## 2021-01-10 DIAGNOSIS — E7800 Pure hypercholesterolemia, unspecified: Secondary | ICD-10-CM | POA: Diagnosis not present

## 2021-01-10 DIAGNOSIS — I5030 Unspecified diastolic (congestive) heart failure: Secondary | ICD-10-CM | POA: Diagnosis not present

## 2021-01-10 DIAGNOSIS — I129 Hypertensive chronic kidney disease with stage 1 through stage 4 chronic kidney disease, or unspecified chronic kidney disease: Secondary | ICD-10-CM | POA: Diagnosis not present

## 2021-01-24 DIAGNOSIS — N2581 Secondary hyperparathyroidism of renal origin: Secondary | ICD-10-CM | POA: Diagnosis not present

## 2021-01-24 DIAGNOSIS — R600 Localized edema: Secondary | ICD-10-CM | POA: Diagnosis not present

## 2021-01-24 DIAGNOSIS — Z6841 Body Mass Index (BMI) 40.0 and over, adult: Secondary | ICD-10-CM | POA: Diagnosis not present

## 2021-01-24 DIAGNOSIS — E1142 Type 2 diabetes mellitus with diabetic polyneuropathy: Secondary | ICD-10-CM | POA: Diagnosis not present

## 2021-01-24 DIAGNOSIS — I872 Venous insufficiency (chronic) (peripheral): Secondary | ICD-10-CM | POA: Diagnosis not present

## 2021-01-24 DIAGNOSIS — I129 Hypertensive chronic kidney disease with stage 1 through stage 4 chronic kidney disease, or unspecified chronic kidney disease: Secondary | ICD-10-CM | POA: Diagnosis not present

## 2021-01-24 DIAGNOSIS — E1151 Type 2 diabetes mellitus with diabetic peripheral angiopathy without gangrene: Secondary | ICD-10-CM | POA: Diagnosis not present

## 2021-01-24 DIAGNOSIS — N184 Chronic kidney disease, stage 4 (severe): Secondary | ICD-10-CM | POA: Diagnosis not present

## 2021-01-24 DIAGNOSIS — E1121 Type 2 diabetes mellitus with diabetic nephropathy: Secondary | ICD-10-CM | POA: Diagnosis not present

## 2021-02-01 IMAGING — RF DG ESOPHAGUS
7 series · 14 of 24 positions shown · non-contrast
Comparison: None.

CLINICAL DATA: Esophageal dysphagia

EXAM:
ESOPHOGRAM / BARIUM SWALLOW / BARIUM TABLET STUDY
TECHNIQUE: Combined double contrast and single contrast examination performed
using effervescent crystals, thick barium liquid, and thin barium
liquid. The patient was observed with fluoroscopy swallowing a 13 mm
barium sulphate tablet.
FLUOROSCOPY TIME:  Fluoroscopy Time:  2 minutes 18 seconds
Radiation Exposure Index (if provided by the fluoroscopic device):
277 mGy
Number of Acquired Spot Images: 0

[Series 1: one shot · 2 of 4 slices shown (1 of 3)]
[im 1/4]
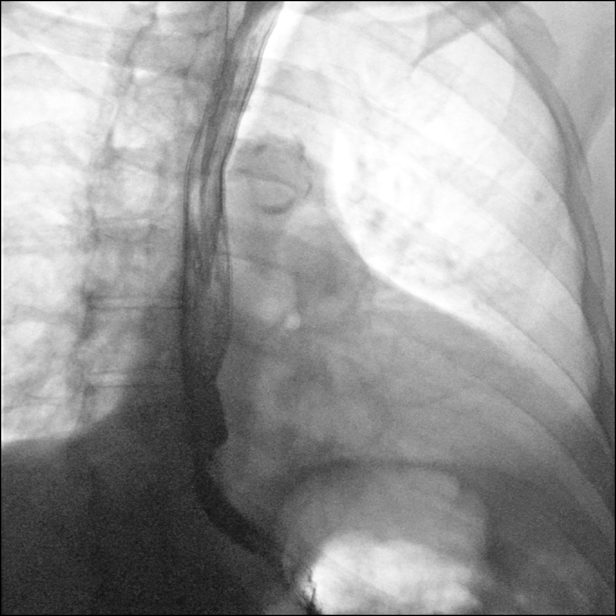
[im 4/4]
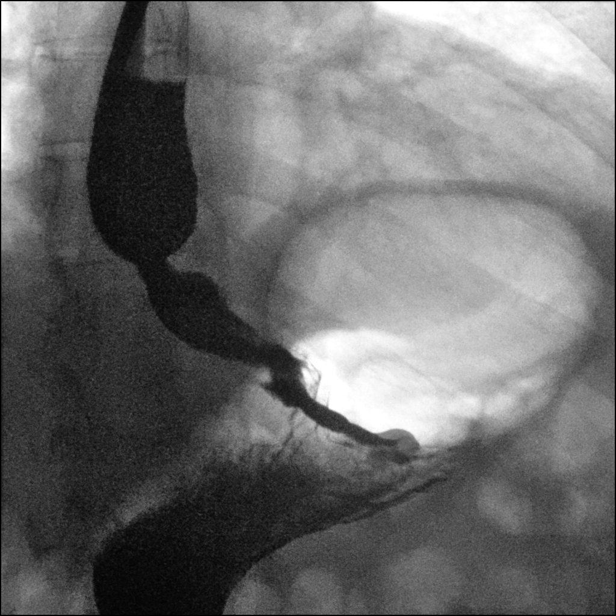

[Series 2: sequence · 1 of 76 frames shown (1 of 4)]
[frame 39/76]
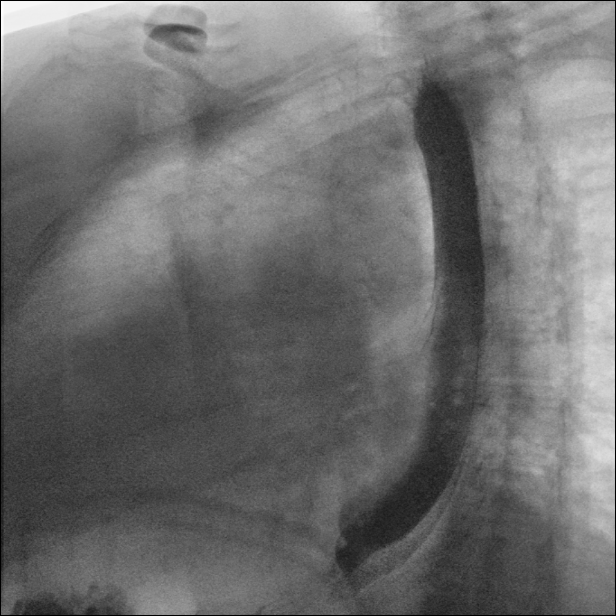

[Series 3: sequence · 2 of 11 frames shown (2 of 4)]
[frame 2/11]
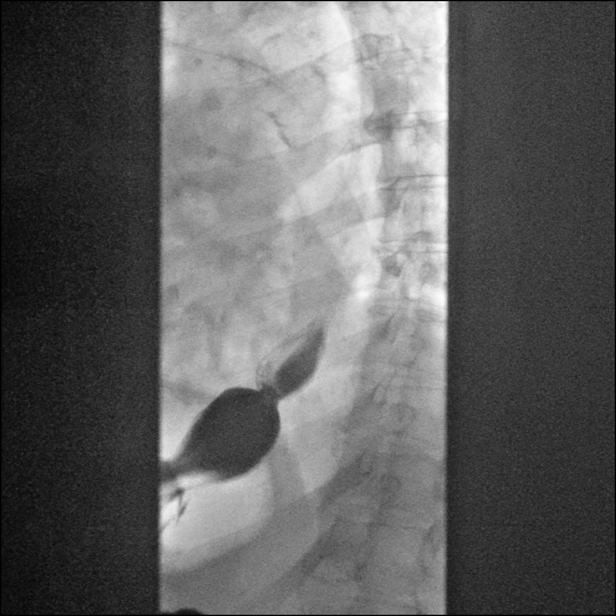
[frame 6/11]
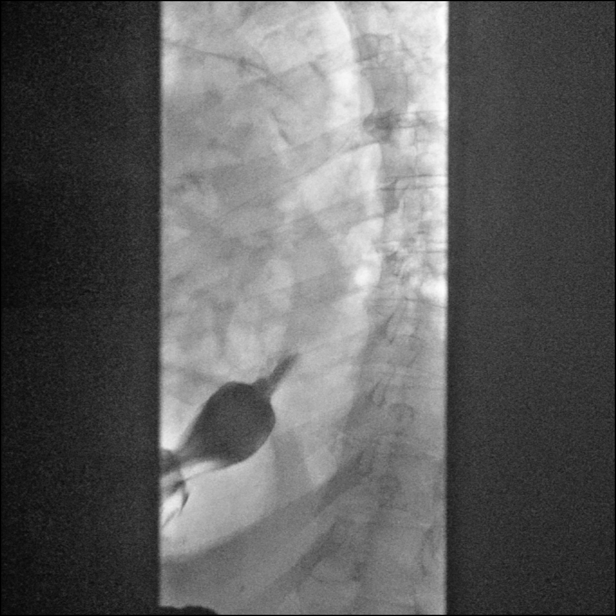

[Series 4: one shot · 2 of 3 slices shown (2 of 3)]
[im 1/3]
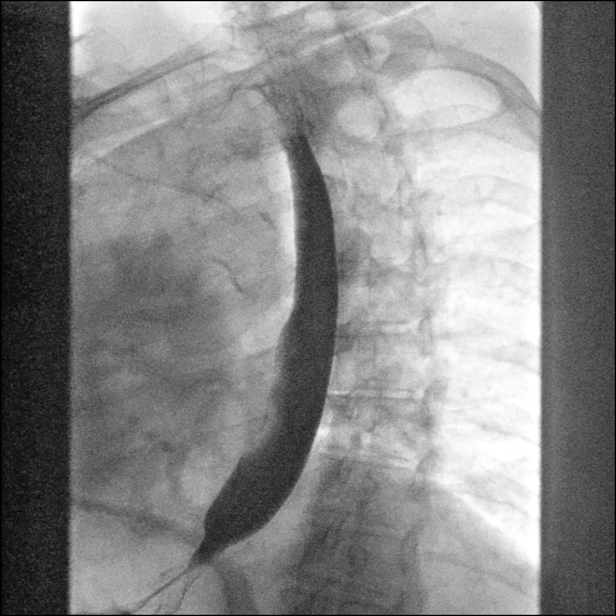
[im 3/3]
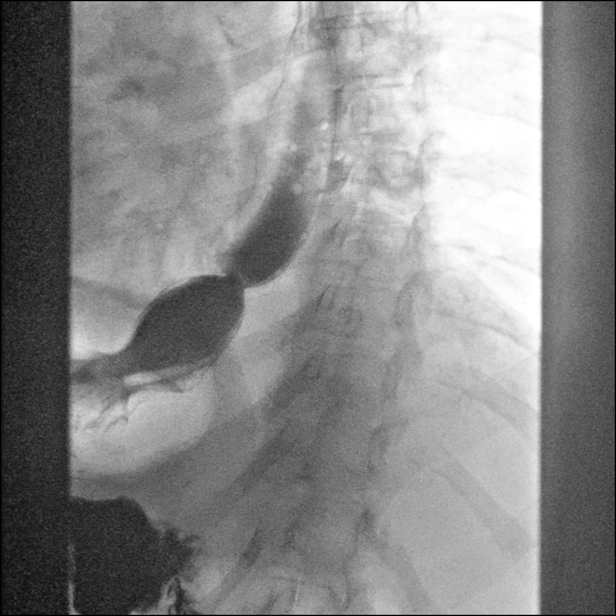

[Series 5: sequence · 2 of 11 frames shown (3 of 4)]
[frame 2/11]
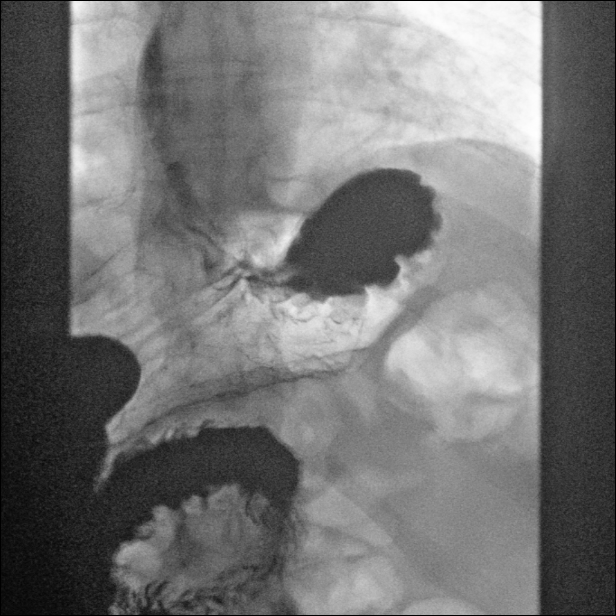
[frame 10/11]
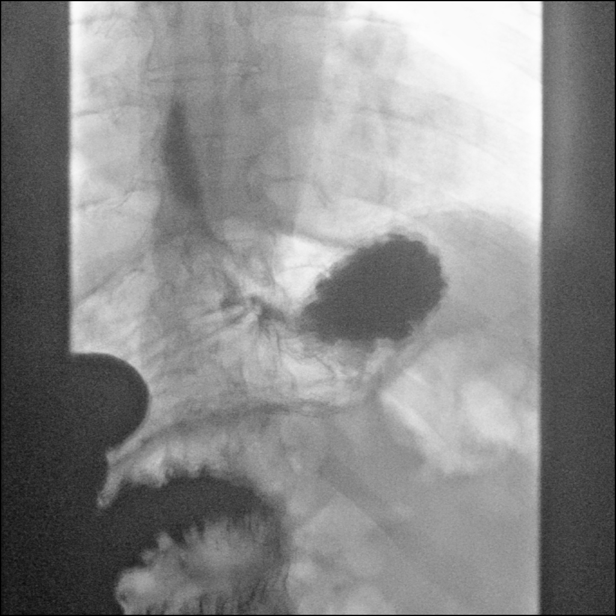

[Series 6: one shot · 3 of 8 slices shown (3 of 3)]
[im 3/8]
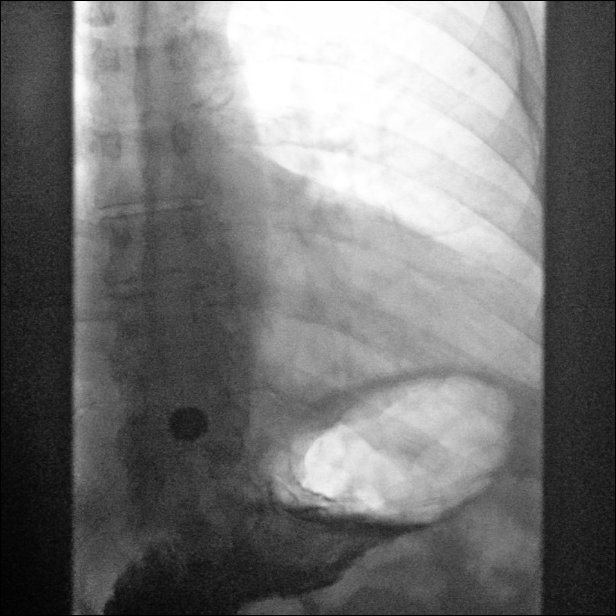
[im 5/8]
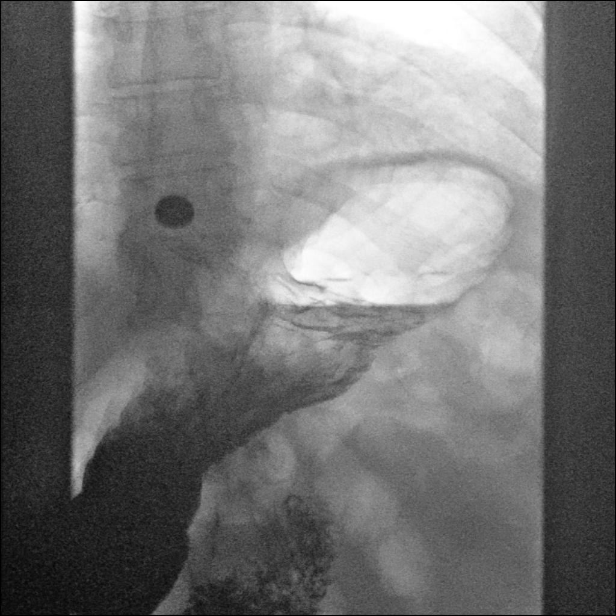
[im 7/8]
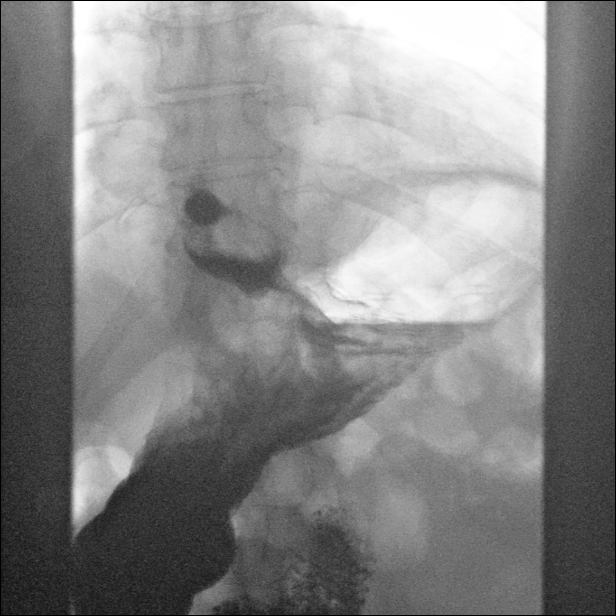

[Series 7: sequence · 2 of 50 frames shown (4 of 4)]
[frame 1/50]
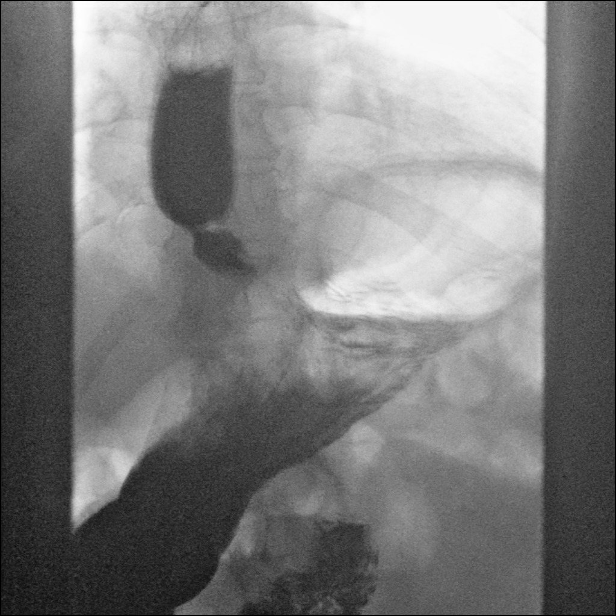
[frame 43/50]
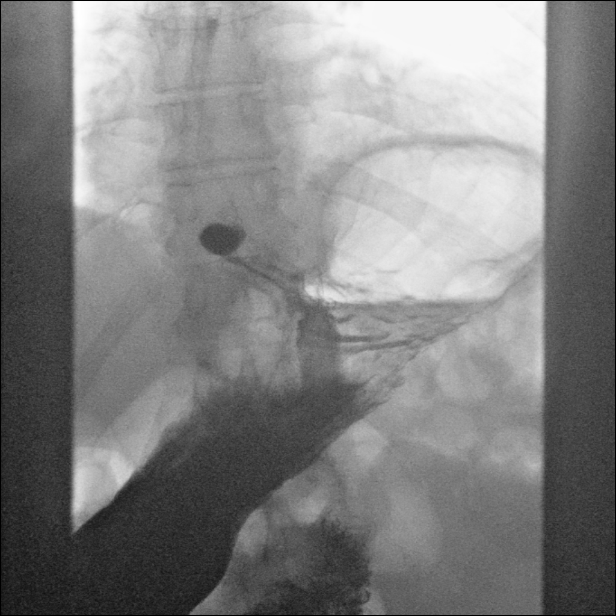

[14 of 24 positions shown; findings below may reference images not displayed]

FINDINGS: A small hiatal hernia is identified. Just above the hiatal hernia
there is a focal area of smoothly marginated luminal narrowing. A 13
mm barium sulfate tablet was ingested which was unable to pass
through this area of narrowing. The remainder of the esophagus
appears normal. The motility of the esophagus is unremarkable.
IMPRESSION: Benign appearing distal esophageal stricture identified through
which a 13 mm barium tablet was unable to pass.

Small hiatal hernia.

## 2021-02-03 DIAGNOSIS — D631 Anemia in chronic kidney disease: Secondary | ICD-10-CM | POA: Diagnosis not present

## 2021-02-03 DIAGNOSIS — N189 Chronic kidney disease, unspecified: Secondary | ICD-10-CM | POA: Diagnosis not present

## 2021-02-03 DIAGNOSIS — N2581 Secondary hyperparathyroidism of renal origin: Secondary | ICD-10-CM | POA: Diagnosis not present

## 2021-02-03 DIAGNOSIS — I129 Hypertensive chronic kidney disease with stage 1 through stage 4 chronic kidney disease, or unspecified chronic kidney disease: Secondary | ICD-10-CM | POA: Diagnosis not present

## 2021-02-03 DIAGNOSIS — E1122 Type 2 diabetes mellitus with diabetic chronic kidney disease: Secondary | ICD-10-CM | POA: Diagnosis not present

## 2021-02-03 DIAGNOSIS — N184 Chronic kidney disease, stage 4 (severe): Secondary | ICD-10-CM | POA: Diagnosis not present

## 2021-02-04 DIAGNOSIS — Z79899 Other long term (current) drug therapy: Secondary | ICD-10-CM | POA: Diagnosis not present

## 2021-02-06 DIAGNOSIS — N184 Chronic kidney disease, stage 4 (severe): Secondary | ICD-10-CM | POA: Diagnosis not present

## 2021-02-06 DIAGNOSIS — E1151 Type 2 diabetes mellitus with diabetic peripheral angiopathy without gangrene: Secondary | ICD-10-CM | POA: Diagnosis not present

## 2021-02-06 DIAGNOSIS — I5030 Unspecified diastolic (congestive) heart failure: Secondary | ICD-10-CM | POA: Diagnosis not present

## 2021-02-06 DIAGNOSIS — E1142 Type 2 diabetes mellitus with diabetic polyneuropathy: Secondary | ICD-10-CM | POA: Diagnosis not present

## 2021-02-06 DIAGNOSIS — E1121 Type 2 diabetes mellitus with diabetic nephropathy: Secondary | ICD-10-CM | POA: Diagnosis not present

## 2021-02-06 DIAGNOSIS — E7800 Pure hypercholesterolemia, unspecified: Secondary | ICD-10-CM | POA: Diagnosis not present

## 2021-02-06 DIAGNOSIS — I129 Hypertensive chronic kidney disease with stage 1 through stage 4 chronic kidney disease, or unspecified chronic kidney disease: Secondary | ICD-10-CM | POA: Diagnosis not present

## 2021-02-06 DIAGNOSIS — N2581 Secondary hyperparathyroidism of renal origin: Secondary | ICD-10-CM | POA: Diagnosis not present

## 2021-02-14 ENCOUNTER — Other Ambulatory Visit (HOSPITAL_BASED_OUTPATIENT_CLINIC_OR_DEPARTMENT_OTHER): Payer: Self-pay

## 2021-02-14 DIAGNOSIS — G4733 Obstructive sleep apnea (adult) (pediatric): Secondary | ICD-10-CM

## 2021-02-18 DIAGNOSIS — Z03818 Encounter for observation for suspected exposure to other biological agents ruled out: Secondary | ICD-10-CM | POA: Diagnosis not present

## 2021-02-18 DIAGNOSIS — R051 Acute cough: Secondary | ICD-10-CM | POA: Diagnosis not present

## 2021-02-24 DIAGNOSIS — H2513 Age-related nuclear cataract, bilateral: Secondary | ICD-10-CM | POA: Diagnosis not present

## 2021-02-24 DIAGNOSIS — H52202 Unspecified astigmatism, left eye: Secondary | ICD-10-CM | POA: Diagnosis not present

## 2021-02-24 DIAGNOSIS — E113293 Type 2 diabetes mellitus with mild nonproliferative diabetic retinopathy without macular edema, bilateral: Secondary | ICD-10-CM | POA: Diagnosis not present

## 2021-02-24 DIAGNOSIS — H401131 Primary open-angle glaucoma, bilateral, mild stage: Secondary | ICD-10-CM | POA: Diagnosis not present

## 2021-03-05 DIAGNOSIS — N184 Chronic kidney disease, stage 4 (severe): Secondary | ICD-10-CM | POA: Diagnosis not present

## 2021-03-05 DIAGNOSIS — E1121 Type 2 diabetes mellitus with diabetic nephropathy: Secondary | ICD-10-CM | POA: Diagnosis not present

## 2021-03-05 DIAGNOSIS — I129 Hypertensive chronic kidney disease with stage 1 through stage 4 chronic kidney disease, or unspecified chronic kidney disease: Secondary | ICD-10-CM | POA: Diagnosis not present

## 2021-03-05 DIAGNOSIS — E7800 Pure hypercholesterolemia, unspecified: Secondary | ICD-10-CM | POA: Diagnosis not present

## 2021-03-07 ENCOUNTER — Encounter: Payer: Self-pay | Admitting: Podiatry

## 2021-03-07 ENCOUNTER — Ambulatory Visit (INDEPENDENT_AMBULATORY_CARE_PROVIDER_SITE_OTHER): Payer: HMO | Admitting: Podiatry

## 2021-03-07 ENCOUNTER — Other Ambulatory Visit: Payer: Self-pay

## 2021-03-07 DIAGNOSIS — N2581 Secondary hyperparathyroidism of renal origin: Secondary | ICD-10-CM | POA: Insufficient documentation

## 2021-03-07 DIAGNOSIS — B351 Tinea unguium: Secondary | ICD-10-CM

## 2021-03-07 DIAGNOSIS — N184 Chronic kidney disease, stage 4 (severe): Secondary | ICD-10-CM | POA: Diagnosis not present

## 2021-03-07 DIAGNOSIS — M79674 Pain in right toe(s): Secondary | ICD-10-CM

## 2021-03-07 DIAGNOSIS — M79675 Pain in left toe(s): Secondary | ICD-10-CM | POA: Diagnosis not present

## 2021-03-07 DIAGNOSIS — E11319 Type 2 diabetes mellitus with unspecified diabetic retinopathy without macular edema: Secondary | ICD-10-CM | POA: Insufficient documentation

## 2021-03-07 DIAGNOSIS — E1122 Type 2 diabetes mellitus with diabetic chronic kidney disease: Secondary | ICD-10-CM | POA: Diagnosis not present

## 2021-03-07 DIAGNOSIS — B353 Tinea pedis: Secondary | ICD-10-CM

## 2021-03-07 DIAGNOSIS — I872 Venous insufficiency (chronic) (peripheral): Secondary | ICD-10-CM | POA: Insufficient documentation

## 2021-03-07 MED ORDER — KETOCONAZOLE 2 % EX CREA: TOPICAL_CREAM | CUTANEOUS | 1 refills | Status: DC

## 2021-03-13 NOTE — Progress Notes (Signed)
°  Subjective:  Patient ID: Daniel Warner, male    DOB: Dec 21, 1940,  MRN: [id number]  Daniel Warner presents to clinic today for at risk foot care. Pt has h/o NIDDM with chronic kidney disease and thick, elongated toenails 1-5 bilaterally which are tender when wearing enclosed shoe gear.  Patient did not check blood glucose today. States A1c is about 7.0%.  New problem(s): None.   PCP is Lajean Manes, MD , and last visit was January, 2023, per patient recall.  Allergies  Allergen Reactions   Losartan Potassium     Other reaction(s): itching and hand swelling   Lisinopril     Other reaction(s): Cough    Review of Systems: Negative except as noted in the HPI. Objective:   Constitutional FODAY CONE is a pleasant 81 y.o. African American male, obese in NAD. AAO x 3.   Vascular CFT <3 seconds b/l LE. Diminished pedal pulses b/l LE. No pain with calf compression b/l. Lower extremity skin temperature gradient within normal limits. +1 pitting edema noted BLE. No ischemia or gangrene noted b/l LE. No cyanosis or clubbing noted b/l LE.  Neurologic Normal speech. Oriented to person, place, and time. Protective sensation intact 5/5 intact bilaterally with 10g monofilament b/l.  Dermatologic No open wounds b/l LE. Diffuse scaling noted peripherally and plantarly b/l feet.  No interdigital macerations.  No blisters, no weeping. No signs of secondary bacterial infection noted.  Orthopedic: Muscle strength 5/5 to all lower extremity muscle groups bilaterally. Pes planus deformity noted bilateral LE.   Radiographs: None  Last A1c: No flowsheet data found.   Assessment:   1. Pain due to onychomycosis of toenails of both feet   2. Tinea pedis of both feet   3. Type 2 diabetes mellitus with stage 4 chronic kidney disease, without long-term current use of insulin ([place])    Plan:  Patient was evaluated and treated and all questions answered. Consent given for treatment as described  below: -Toenails 1-5 b/l were debrided in length and girth with sterile nail nippers and dremel without iatrogenic bleeding.  -For tinea pedis, Rx sent to pharmacy for Ketoconazole Cream 2% to be applied once daily for six weeks. -Patient/POA to call should there be question/concern in the interim.  Return in about 3 months (around 06/04/2021).

## 2021-03-14 ENCOUNTER — Other Ambulatory Visit: Payer: Self-pay | Admitting: Physician Assistant

## 2021-03-14 ENCOUNTER — Ambulatory Visit
Admission: RE | Admit: 2021-03-14 | Discharge: 2021-03-14 | Disposition: A | Discharge status: Home / Self Care | Payer: HMO | Source: Ambulatory Visit | Attending: Physician Assistant | Admitting: Physician Assistant

## 2021-03-14 DIAGNOSIS — R0602 Shortness of breath: Secondary | ICD-10-CM | POA: Diagnosis not present

## 2021-03-14 DIAGNOSIS — R0989 Other specified symptoms and signs involving the circulatory and respiratory systems: Secondary | ICD-10-CM

## 2021-03-14 DIAGNOSIS — R051 Acute cough: Secondary | ICD-10-CM

## 2021-03-14 DIAGNOSIS — R062 Wheezing: Secondary | ICD-10-CM

## 2021-03-14 DIAGNOSIS — R634 Abnormal weight loss: Secondary | ICD-10-CM | POA: Diagnosis not present

## 2021-03-14 DIAGNOSIS — R059 Cough, unspecified: Secondary | ICD-10-CM | POA: Diagnosis not present

## 2021-03-21 DIAGNOSIS — N184 Chronic kidney disease, stage 4 (severe): Secondary | ICD-10-CM | POA: Diagnosis not present

## 2021-03-21 DIAGNOSIS — R053 Chronic cough: Secondary | ICD-10-CM | POA: Diagnosis not present

## 2021-03-21 DIAGNOSIS — I5033 Acute on chronic diastolic (congestive) heart failure: Secondary | ICD-10-CM | POA: Diagnosis not present

## 2021-03-28 DIAGNOSIS — I5033 Acute on chronic diastolic (congestive) heart failure: Secondary | ICD-10-CM | POA: Diagnosis not present

## 2021-04-15 DIAGNOSIS — I129 Hypertensive chronic kidney disease with stage 1 through stage 4 chronic kidney disease, or unspecified chronic kidney disease: Secondary | ICD-10-CM | POA: Diagnosis not present

## 2021-04-15 DIAGNOSIS — E1121 Type 2 diabetes mellitus with diabetic nephropathy: Secondary | ICD-10-CM | POA: Diagnosis not present

## 2021-04-15 DIAGNOSIS — N184 Chronic kidney disease, stage 4 (severe): Secondary | ICD-10-CM | POA: Diagnosis not present

## 2021-04-15 DIAGNOSIS — I5032 Chronic diastolic (congestive) heart failure: Secondary | ICD-10-CM | POA: Diagnosis not present

## 2021-05-05 DIAGNOSIS — N184 Chronic kidney disease, stage 4 (severe): Secondary | ICD-10-CM | POA: Diagnosis not present

## 2021-05-05 DIAGNOSIS — I129 Hypertensive chronic kidney disease with stage 1 through stage 4 chronic kidney disease, or unspecified chronic kidney disease: Secondary | ICD-10-CM | POA: Diagnosis not present

## 2021-05-05 DIAGNOSIS — E7800 Pure hypercholesterolemia, unspecified: Secondary | ICD-10-CM | POA: Diagnosis not present

## 2021-05-05 DIAGNOSIS — E1121 Type 2 diabetes mellitus with diabetic nephropathy: Secondary | ICD-10-CM | POA: Diagnosis not present

## 2021-05-06 DIAGNOSIS — I129 Hypertensive chronic kidney disease with stage 1 through stage 4 chronic kidney disease, or unspecified chronic kidney disease: Secondary | ICD-10-CM | POA: Diagnosis not present

## 2021-05-06 DIAGNOSIS — D631 Anemia in chronic kidney disease: Secondary | ICD-10-CM | POA: Diagnosis not present

## 2021-05-06 DIAGNOSIS — N184 Chronic kidney disease, stage 4 (severe): Secondary | ICD-10-CM | POA: Diagnosis not present

## 2021-05-06 DIAGNOSIS — E1122 Type 2 diabetes mellitus with diabetic chronic kidney disease: Secondary | ICD-10-CM | POA: Diagnosis not present

## 2021-06-04 DIAGNOSIS — R600 Localized edema: Secondary | ICD-10-CM | POA: Diagnosis not present

## 2021-06-04 DIAGNOSIS — I5032 Chronic diastolic (congestive) heart failure: Secondary | ICD-10-CM | POA: Diagnosis not present

## 2021-06-04 DIAGNOSIS — N184 Chronic kidney disease, stage 4 (severe): Secondary | ICD-10-CM | POA: Diagnosis not present

## 2021-06-04 DIAGNOSIS — I129 Hypertensive chronic kidney disease with stage 1 through stage 4 chronic kidney disease, or unspecified chronic kidney disease: Secondary | ICD-10-CM | POA: Diagnosis not present

## 2021-06-10 ENCOUNTER — Ambulatory Visit (INDEPENDENT_AMBULATORY_CARE_PROVIDER_SITE_OTHER): Payer: Self-pay | Admitting: Podiatry

## 2021-06-10 DIAGNOSIS — Z91199 Patient's noncompliance with other medical treatment and regimen due to unspecified reason: Secondary | ICD-10-CM

## 2021-06-11 DIAGNOSIS — I129 Hypertensive chronic kidney disease with stage 1 through stage 4 chronic kidney disease, or unspecified chronic kidney disease: Secondary | ICD-10-CM | POA: Diagnosis not present

## 2021-06-11 DIAGNOSIS — I5030 Unspecified diastolic (congestive) heart failure: Secondary | ICD-10-CM | POA: Diagnosis not present

## 2021-06-11 DIAGNOSIS — E7800 Pure hypercholesterolemia, unspecified: Secondary | ICD-10-CM | POA: Diagnosis not present

## 2021-06-11 DIAGNOSIS — N184 Chronic kidney disease, stage 4 (severe): Secondary | ICD-10-CM | POA: Diagnosis not present

## 2021-06-11 DIAGNOSIS — E1142 Type 2 diabetes mellitus with diabetic polyneuropathy: Secondary | ICD-10-CM | POA: Diagnosis not present

## 2021-06-11 NOTE — Progress Notes (Signed)
   Complete physical exam  Patient: Daniel Warner   DOB: 11/01/1998   81 y.o. Male  MRN: [id number]  Subjective:    No chief complaint on file.   Daniel Warner is a 81 y.o. male who presents today for a complete physical exam. She reports consuming a {diet types:17450} diet. {types:19826} She generally feels {DESC; WELL/FAIRLY WELL/POORLY:18703}. She reports sleeping {DESC; WELL/FAIRLY WELL/POORLY:18703}. She {does/does not:200015} have additional problems to discuss today.    Most recent fall risk assessment:    07/09/2021   10:42 AM  Fall Risk   Falls in the past year? 0  Number falls in past yr: 0  Injury with Fall? 0  Risk for fall due to : No Fall Risks  Follow up Falls evaluation completed     Most recent depression screenings:    07/09/2021   10:42 AM 05/30/2020   10:46 AM  PHQ 2/9 Scores  PHQ - 2 Score 0 0  PHQ- 9 Score 5     {VISON DENTAL STD PSA (Optional):27386}  {History (Optional):23778}  Patient Care Team: Jessup, Joy, NP as PCP - General (Nurse Practitioner)   Outpatient Medications Prior to Visit  Medication Sig   fluticasone (FLONASE) 50 MCG/ACT nasal spray Place 2 sprays into both nostrils in the morning and at bedtime. After 7 days, reduce to once daily.   norgestimate-ethinyl estradiol (SPRINTEC 28) 0.25-35 MG-MCG tablet Take 1 tablet by mouth daily.   Nystatin POWD Apply liberally to affected area 2 times per day   spironolactone (ALDACTONE) 100 MG tablet Take 1 tablet (100 mg total) by mouth daily.   No facility-administered medications prior to visit.    ROS        Objective:     There were no vitals taken for this visit. {Vitals History (Optional):23777}  Physical Exam   No results found for any visits on 08/14/21. {Show previous labs (optional):23779}    Assessment & Plan:    Routine Health Maintenance and Physical Exam  Immunization History  Administered Date(s) Administered   DTaP 01/15/1999, 03/13/1999,  05/22/1999, 02/05/2000, 08/21/2003   Hepatitis A 06/17/2007, 06/22/2008   Hepatitis B 11/02/1998, 12/10/1998, 05/22/1999   HiB (PRP-OMP) 01/15/1999, 03/13/1999, 05/22/1999, 02/05/2000   IPV 01/15/1999, 03/13/1999, 11/10/1999, 08/21/2003   Influenza,inj,Quad PF,6+ Mos 09/22/2013   Influenza-Unspecified 12/23/2011   MMR 11/09/2000, 08/21/2003   Meningococcal Polysaccharide 06/22/2011   Pneumococcal Conjugate-13 02/05/2000   Pneumococcal-Unspecified 05/22/1999, 08/05/1999   Tdap 06/22/2011   Varicella 11/10/1999, 06/17/2007    Health Maintenance  Topic Date Due   HIV Screening  Never done   Hepatitis C Screening  Never done   INFLUENZA VACCINE  08/12/2021   PAP-Cervical Cytology Screening  08/14/2021 (Originally 11/01/2019)   PAP SMEAR-Modifier  08/14/2021 (Originally 11/01/2019)   TETANUS/TDAP  08/14/2021 (Originally 06/21/2021)   HPV VACCINES  Discontinued   COVID-19 Vaccine  Discontinued    Discussed health benefits of physical activity, and encouraged her to engage in regular exercise appropriate for her age and condition.  Problem List Items Addressed This Visit   None Visit Diagnoses     Annual physical exam    -  Primary   Cervical cancer screening       Need for Tdap vaccination          No follow-ups on file.     Joy Jessup, NP   

## 2021-07-08 DIAGNOSIS — E7800 Pure hypercholesterolemia, unspecified: Secondary | ICD-10-CM | POA: Diagnosis not present

## 2021-07-08 DIAGNOSIS — E1142 Type 2 diabetes mellitus with diabetic polyneuropathy: Secondary | ICD-10-CM | POA: Diagnosis not present

## 2021-07-08 DIAGNOSIS — I5030 Unspecified diastolic (congestive) heart failure: Secondary | ICD-10-CM | POA: Diagnosis not present

## 2021-07-08 DIAGNOSIS — I129 Hypertensive chronic kidney disease with stage 1 through stage 4 chronic kidney disease, or unspecified chronic kidney disease: Secondary | ICD-10-CM | POA: Diagnosis not present

## 2021-07-08 DIAGNOSIS — N184 Chronic kidney disease, stage 4 (severe): Secondary | ICD-10-CM | POA: Diagnosis not present

## 2021-08-04 ENCOUNTER — Ambulatory Visit: Payer: HMO | Admitting: Podiatry

## 2021-08-05 DIAGNOSIS — I5030 Unspecified diastolic (congestive) heart failure: Secondary | ICD-10-CM | POA: Diagnosis not present

## 2021-08-05 DIAGNOSIS — I129 Hypertensive chronic kidney disease with stage 1 through stage 4 chronic kidney disease, or unspecified chronic kidney disease: Secondary | ICD-10-CM | POA: Diagnosis not present

## 2021-08-05 DIAGNOSIS — N184 Chronic kidney disease, stage 4 (severe): Secondary | ICD-10-CM | POA: Diagnosis not present

## 2021-08-05 DIAGNOSIS — E7800 Pure hypercholesterolemia, unspecified: Secondary | ICD-10-CM | POA: Diagnosis not present

## 2021-08-05 DIAGNOSIS — N2581 Secondary hyperparathyroidism of renal origin: Secondary | ICD-10-CM | POA: Diagnosis not present

## 2021-08-05 DIAGNOSIS — E1142 Type 2 diabetes mellitus with diabetic polyneuropathy: Secondary | ICD-10-CM | POA: Diagnosis not present

## 2021-08-22 ENCOUNTER — Ambulatory Visit: Payer: HMO | Admitting: Podiatry

## 2021-08-22 ENCOUNTER — Encounter: Payer: Self-pay | Admitting: Podiatry

## 2021-08-22 DIAGNOSIS — N184 Chronic kidney disease, stage 4 (severe): Secondary | ICD-10-CM | POA: Diagnosis not present

## 2021-08-22 DIAGNOSIS — M2141 Flat foot [pes planus] (acquired), right foot: Secondary | ICD-10-CM

## 2021-08-22 DIAGNOSIS — E1122 Type 2 diabetes mellitus with diabetic chronic kidney disease: Secondary | ICD-10-CM | POA: Diagnosis not present

## 2021-08-22 DIAGNOSIS — M79675 Pain in left toe(s): Secondary | ICD-10-CM | POA: Diagnosis not present

## 2021-08-22 DIAGNOSIS — I872 Venous insufficiency (chronic) (peripheral): Secondary | ICD-10-CM

## 2021-08-22 DIAGNOSIS — I739 Peripheral vascular disease, unspecified: Secondary | ICD-10-CM | POA: Diagnosis not present

## 2021-08-22 DIAGNOSIS — B351 Tinea unguium: Secondary | ICD-10-CM | POA: Diagnosis not present

## 2021-08-22 DIAGNOSIS — M79674 Pain in right toe(s): Secondary | ICD-10-CM

## 2021-08-22 DIAGNOSIS — M2142 Flat foot [pes planus] (acquired), left foot: Secondary | ICD-10-CM | POA: Diagnosis not present

## 2021-08-22 NOTE — Progress Notes (Signed)
This patient returns to my office for at risk foot care.  This patient requires this care by a professional since this patient will be at risk due to having PAD  CKD polyneuropathy and diabetes. This patient is unable to cut nails himself since the patient cannot reach his nails.These nails are painful walking and wearing shoes.  This patient presents for at risk foot care today.  General Appearance  Alert, conversant and in no acute stress.  Vascular  Dorsalis pedis and posterior tibial  pulses are palpable  bilaterally.  Capillary return is within normal limits  bilaterally. Temperature is within normal limits  bilaterally.  Neurologic  Senn-Weinstein monofilament wire test within normal limits  bilaterally. Muscle power within normal limits bilaterally.  Nails Thick disfigured discolored nails with subungual debris  from hallux to fifth toes bilaterally. No evidence of bacterial infection or drainage bilaterally.  Orthopedic  No limitations of motion  feet .  No crepitus or effusions noted.  No bony pathology or digital deformities noted.  Skin  normotropic skin with no porokeratosis noted bilaterally.  No signs of infections or ulcers noted.     Onychomycosis  Pain in right toes  Pain in left toes  Consent was obtained for treatment procedures.   Mechanical debridement of nails 1-5  bilaterally performed with a nail nipper.  Filed with dremel without incident.    Return office visit   3 months                   Told patient to return for periodic foot care and evaluation due to potential at risk complications.   Gardiner Barefoot DPM

## 2021-08-31 ENCOUNTER — Other Ambulatory Visit: Payer: Self-pay | Admitting: Podiatry

## 2021-08-31 DIAGNOSIS — B353 Tinea pedis: Secondary | ICD-10-CM

## 2021-09-01 DIAGNOSIS — I5030 Unspecified diastolic (congestive) heart failure: Secondary | ICD-10-CM | POA: Diagnosis not present

## 2021-09-01 DIAGNOSIS — I129 Hypertensive chronic kidney disease with stage 1 through stage 4 chronic kidney disease, or unspecified chronic kidney disease: Secondary | ICD-10-CM | POA: Diagnosis not present

## 2021-09-01 DIAGNOSIS — E7800 Pure hypercholesterolemia, unspecified: Secondary | ICD-10-CM | POA: Diagnosis not present

## 2021-09-01 DIAGNOSIS — N184 Chronic kidney disease, stage 4 (severe): Secondary | ICD-10-CM | POA: Diagnosis not present

## 2021-09-01 DIAGNOSIS — E1142 Type 2 diabetes mellitus with diabetic polyneuropathy: Secondary | ICD-10-CM | POA: Diagnosis not present

## 2021-09-02 NOTE — Telephone Encounter (Signed)
Patient saw Dr. Prudence Davidson last and no documentation of tinea pedis so Rx is not needed.

## 2021-09-05 DIAGNOSIS — I129 Hypertensive chronic kidney disease with stage 1 through stage 4 chronic kidney disease, or unspecified chronic kidney disease: Secondary | ICD-10-CM | POA: Diagnosis not present

## 2021-09-05 DIAGNOSIS — E1122 Type 2 diabetes mellitus with diabetic chronic kidney disease: Secondary | ICD-10-CM | POA: Diagnosis not present

## 2021-09-05 DIAGNOSIS — N189 Chronic kidney disease, unspecified: Secondary | ICD-10-CM | POA: Diagnosis not present

## 2021-09-05 DIAGNOSIS — N184 Chronic kidney disease, stage 4 (severe): Secondary | ICD-10-CM | POA: Diagnosis not present

## 2021-09-05 DIAGNOSIS — D631 Anemia in chronic kidney disease: Secondary | ICD-10-CM | POA: Diagnosis not present

## 2021-09-05 DIAGNOSIS — N2581 Secondary hyperparathyroidism of renal origin: Secondary | ICD-10-CM | POA: Diagnosis not present

## 2021-09-16 DIAGNOSIS — N2581 Secondary hyperparathyroidism of renal origin: Secondary | ICD-10-CM | POA: Diagnosis not present

## 2021-09-16 DIAGNOSIS — E1121 Type 2 diabetes mellitus with diabetic nephropathy: Secondary | ICD-10-CM | POA: Diagnosis not present

## 2021-09-16 DIAGNOSIS — N184 Chronic kidney disease, stage 4 (severe): Secondary | ICD-10-CM | POA: Diagnosis not present

## 2021-09-16 DIAGNOSIS — Z79899 Other long term (current) drug therapy: Secondary | ICD-10-CM | POA: Diagnosis not present

## 2021-09-16 DIAGNOSIS — E1151 Type 2 diabetes mellitus with diabetic peripheral angiopathy without gangrene: Secondary | ICD-10-CM | POA: Diagnosis not present

## 2021-09-16 DIAGNOSIS — I5032 Chronic diastolic (congestive) heart failure: Secondary | ICD-10-CM | POA: Diagnosis not present

## 2021-09-16 DIAGNOSIS — F5101 Primary insomnia: Secondary | ICD-10-CM | POA: Diagnosis not present

## 2021-09-16 DIAGNOSIS — E7800 Pure hypercholesterolemia, unspecified: Secondary | ICD-10-CM | POA: Diagnosis not present

## 2021-09-16 DIAGNOSIS — I700 Atherosclerosis of aorta: Secondary | ICD-10-CM | POA: Diagnosis not present

## 2021-09-16 DIAGNOSIS — Z0000 Encounter for general adult medical examination without abnormal findings: Secondary | ICD-10-CM | POA: Diagnosis not present

## 2021-09-16 DIAGNOSIS — I129 Hypertensive chronic kidney disease with stage 1 through stage 4 chronic kidney disease, or unspecified chronic kidney disease: Secondary | ICD-10-CM | POA: Diagnosis not present

## 2021-09-16 DIAGNOSIS — Z1331 Encounter for screening for depression: Secondary | ICD-10-CM | POA: Diagnosis not present

## 2021-09-16 DIAGNOSIS — E1142 Type 2 diabetes mellitus with diabetic polyneuropathy: Secondary | ICD-10-CM | POA: Diagnosis not present

## 2021-10-03 DIAGNOSIS — H401131 Primary open-angle glaucoma, bilateral, mild stage: Secondary | ICD-10-CM | POA: Diagnosis not present

## 2021-12-10 ENCOUNTER — Ambulatory Visit (INDEPENDENT_AMBULATORY_CARE_PROVIDER_SITE_OTHER): Payer: HMO | Admitting: Podiatry

## 2021-12-10 ENCOUNTER — Encounter: Payer: Self-pay | Admitting: Podiatry

## 2021-12-10 DIAGNOSIS — E119 Type 2 diabetes mellitus without complications: Secondary | ICD-10-CM | POA: Insufficient documentation

## 2021-12-10 DIAGNOSIS — R600 Localized edema: Secondary | ICD-10-CM

## 2021-12-10 DIAGNOSIS — I739 Peripheral vascular disease, unspecified: Secondary | ICD-10-CM | POA: Diagnosis not present

## 2021-12-10 DIAGNOSIS — M79674 Pain in right toe(s): Secondary | ICD-10-CM

## 2021-12-10 DIAGNOSIS — M79675 Pain in left toe(s): Secondary | ICD-10-CM | POA: Diagnosis not present

## 2021-12-10 DIAGNOSIS — E1169 Type 2 diabetes mellitus with other specified complication: Secondary | ICD-10-CM | POA: Insufficient documentation

## 2021-12-10 DIAGNOSIS — N184 Chronic kidney disease, stage 4 (severe): Secondary | ICD-10-CM

## 2021-12-10 DIAGNOSIS — B351 Tinea unguium: Secondary | ICD-10-CM

## 2021-12-10 DIAGNOSIS — E1122 Type 2 diabetes mellitus with diabetic chronic kidney disease: Secondary | ICD-10-CM | POA: Diagnosis not present

## 2021-12-13 NOTE — Addendum Note (Signed)
Addended by: Marzetta Board on: 12/13/2021 10:25 PM   Modules accepted: Level of Service

## 2021-12-13 NOTE — Progress Notes (Addendum)
  Subjective:  Patient ID: Daniel Warner, male    DOB: 03/11/1940,  MRN: [id number]  Daniel Warner presents to clinic today for for annual diabetic foot examination, at risk foot care. Pt has h/o NIDDM with chronic kidney disease, and painful thick toenails that are difficult to trim. Pain interferes with ambulation. Aggravating factors include wearing enclosed shoe gear. Pain is relieved with periodic professional debridement.  Chief Complaint  Patient presents with   Nail Problem    Diabetic foot care BS-did not check today A1C-7.0 PCP-Jonathan Henderson  PCP VST-2 weeks ago   New problem(s): None.   PCP is Kathalene Frames, MD.  Allergies  Allergen Reactions   Losartan Potassium     Other reaction(s): itching and hand swelling   Lisinopril     Other reaction(s): Cough    Review of Systems: Negative except as noted in the HPI.  Objective: No changes noted in today's physical examination. There were no vitals filed for this visit.  Daniel Warner is a pleasant 81 y.o. male morbidly obese in NAD. AAO x 3.  Vascular Examination: CFT <4 seconds b/l. DP pulses diminished b/l. PT pulses diminished b/l. Digital hair absent. Skin temperature gradient warm to warm b/l. No ischemia or gangrene. No cyanosis or clubbing noted b/l. Patient wearing compression hose on today's visit. +2 pitting edema BLE.   Neurological Examination: Sensation grossly intact b/l with 10 gram monofilament. Vibratory sensation intact b/l.   Dermatological Examination: Pedal skin thin, shiny and atrophic b/l. No open wounds. No interdigital macerations. Toenails 1-5 b/l thick, discolored, elongated with subungual debris and pain on dorsal palpation.    Musculoskeletal Examination: Muscle strength 5/5 to b/l LE. No pain, crepitus or joint limitation noted with ROM bilateral LE. No gross bony deformities bilaterally. Pes planus deformity noted bilateral LE.  Radiographs:  None  Assessment/Plan: 1. Pain due to onychomycosis of toenails of both feet   2. PAD (peripheral artery disease) ([place])   3. Localized edema   4. Type 2 diabetes mellitus with stage 4 chronic kidney disease, without long-term current use of insulin ([place])   5. Encounter for diabetic foot exam ([place])     No orders of the defined types were placed in this encounter.  -Consent given for treatment as described below: -Diabetic foot examination performed today. -Continue foot and shoe inspections daily. Monitor blood glucose per PCP/Endocrinologist's recommendations. -Continue supportive shoe gear daily. -Toenails 1-5 b/l were debrided in length and girth with sterile nail nippers and dremel without iatrogenic bleeding.  -Patient/POA to call should there be question/concern in the interim.   Return in about 3 months (around 03/12/2022).  Marzetta Board, DPM

## 2022-01-19 DIAGNOSIS — E1121 Type 2 diabetes mellitus with diabetic nephropathy: Secondary | ICD-10-CM | POA: Diagnosis not present

## 2022-01-19 DIAGNOSIS — N184 Chronic kidney disease, stage 4 (severe): Secondary | ICD-10-CM | POA: Diagnosis not present

## 2022-01-19 DIAGNOSIS — G4733 Obstructive sleep apnea (adult) (pediatric): Secondary | ICD-10-CM | POA: Diagnosis not present

## 2022-01-19 DIAGNOSIS — E11319 Type 2 diabetes mellitus with unspecified diabetic retinopathy without macular edema: Secondary | ICD-10-CM | POA: Diagnosis not present

## 2022-01-19 DIAGNOSIS — E1122 Type 2 diabetes mellitus with diabetic chronic kidney disease: Secondary | ICD-10-CM | POA: Diagnosis not present

## 2022-01-19 DIAGNOSIS — I129 Hypertensive chronic kidney disease with stage 1 through stage 4 chronic kidney disease, or unspecified chronic kidney disease: Secondary | ICD-10-CM | POA: Diagnosis not present

## 2022-01-19 DIAGNOSIS — Z79899 Other long term (current) drug therapy: Secondary | ICD-10-CM | POA: Diagnosis not present

## 2022-01-19 DIAGNOSIS — F5101 Primary insomnia: Secondary | ICD-10-CM | POA: Diagnosis not present

## 2022-01-19 DIAGNOSIS — E1151 Type 2 diabetes mellitus with diabetic peripheral angiopathy without gangrene: Secondary | ICD-10-CM | POA: Diagnosis not present

## 2022-01-19 DIAGNOSIS — E1142 Type 2 diabetes mellitus with diabetic polyneuropathy: Secondary | ICD-10-CM | POA: Diagnosis not present

## 2022-01-19 DIAGNOSIS — I5032 Chronic diastolic (congestive) heart failure: Secondary | ICD-10-CM | POA: Diagnosis not present

## 2022-01-19 DIAGNOSIS — I700 Atherosclerosis of aorta: Secondary | ICD-10-CM | POA: Diagnosis not present

## 2022-01-19 DIAGNOSIS — R0609 Other forms of dyspnea: Secondary | ICD-10-CM | POA: Diagnosis not present

## 2022-02-02 DIAGNOSIS — I129 Hypertensive chronic kidney disease with stage 1 through stage 4 chronic kidney disease, or unspecified chronic kidney disease: Secondary | ICD-10-CM | POA: Diagnosis not present

## 2022-02-02 DIAGNOSIS — E1142 Type 2 diabetes mellitus with diabetic polyneuropathy: Secondary | ICD-10-CM | POA: Diagnosis not present

## 2022-02-02 DIAGNOSIS — N184 Chronic kidney disease, stage 4 (severe): Secondary | ICD-10-CM | POA: Diagnosis not present

## 2022-02-02 DIAGNOSIS — E7800 Pure hypercholesterolemia, unspecified: Secondary | ICD-10-CM | POA: Diagnosis not present

## 2022-02-02 DIAGNOSIS — N2581 Secondary hyperparathyroidism of renal origin: Secondary | ICD-10-CM | POA: Diagnosis not present

## 2022-02-02 DIAGNOSIS — I5032 Chronic diastolic (congestive) heart failure: Secondary | ICD-10-CM | POA: Diagnosis not present

## 2022-02-11 ENCOUNTER — Emergency Department (HOSPITAL_COMMUNITY): Payer: PPO

## 2022-02-11 ENCOUNTER — Other Ambulatory Visit: Payer: Self-pay

## 2022-02-11 ENCOUNTER — Inpatient Hospital Stay (HOSPITAL_COMMUNITY)
Admission: EM | Admit: 2022-02-11 | Discharge: 2022-02-22 | DRG: 286 | Disposition: A | Discharge status: Home / Self Care | Payer: PPO | Attending: Internal Medicine | Admitting: Internal Medicine

## 2022-02-11 ENCOUNTER — Encounter (HOSPITAL_COMMUNITY): Payer: Self-pay

## 2022-02-11 DIAGNOSIS — I509 Heart failure, unspecified: Secondary | ICD-10-CM

## 2022-02-11 DIAGNOSIS — E1142 Type 2 diabetes mellitus with diabetic polyneuropathy: Secondary | ICD-10-CM | POA: Diagnosis present

## 2022-02-11 DIAGNOSIS — N179 Acute kidney failure, unspecified: Secondary | ICD-10-CM | POA: Diagnosis present

## 2022-02-11 DIAGNOSIS — Z91199 Patient's noncompliance with other medical treatment and regimen due to unspecified reason: Secondary | ICD-10-CM

## 2022-02-11 DIAGNOSIS — I959 Hypotension, unspecified: Secondary | ICD-10-CM | POA: Diagnosis not present

## 2022-02-11 DIAGNOSIS — E1151 Type 2 diabetes mellitus with diabetic peripheral angiopathy without gangrene: Secondary | ICD-10-CM | POA: Diagnosis present

## 2022-02-11 DIAGNOSIS — K219 Gastro-esophageal reflux disease without esophagitis: Secondary | ICD-10-CM | POA: Diagnosis present

## 2022-02-11 DIAGNOSIS — Z7982 Long term (current) use of aspirin: Secondary | ICD-10-CM

## 2022-02-11 DIAGNOSIS — I272 Pulmonary hypertension, unspecified: Secondary | ICD-10-CM | POA: Diagnosis present

## 2022-02-11 DIAGNOSIS — I5041 Acute combined systolic (congestive) and diastolic (congestive) heart failure: Secondary | ICD-10-CM | POA: Insufficient documentation

## 2022-02-11 DIAGNOSIS — R7989 Other specified abnormal findings of blood chemistry: Secondary | ICD-10-CM | POA: Diagnosis not present

## 2022-02-11 DIAGNOSIS — E669 Obesity, unspecified: Secondary | ICD-10-CM | POA: Diagnosis present

## 2022-02-11 DIAGNOSIS — J90 Pleural effusion, not elsewhere classified: Secondary | ICD-10-CM | POA: Diagnosis not present

## 2022-02-11 DIAGNOSIS — I440 Atrioventricular block, first degree: Secondary | ICD-10-CM | POA: Diagnosis present

## 2022-02-11 DIAGNOSIS — I5031 Acute diastolic (congestive) heart failure: Secondary | ICD-10-CM | POA: Diagnosis not present

## 2022-02-11 DIAGNOSIS — Y92009 Unspecified place in unspecified non-institutional (private) residence as the place of occurrence of the external cause: Secondary | ICD-10-CM | POA: Diagnosis not present

## 2022-02-11 DIAGNOSIS — Z888 Allergy status to other drugs, medicaments and biological substances status: Secondary | ICD-10-CM | POA: Diagnosis not present

## 2022-02-11 DIAGNOSIS — Z79899 Other long term (current) drug therapy: Secondary | ICD-10-CM | POA: Diagnosis not present

## 2022-02-11 DIAGNOSIS — J9811 Atelectasis: Secondary | ICD-10-CM | POA: Diagnosis present

## 2022-02-11 DIAGNOSIS — I700 Atherosclerosis of aorta: Secondary | ICD-10-CM | POA: Diagnosis present

## 2022-02-11 DIAGNOSIS — I5033 Acute on chronic diastolic (congestive) heart failure: Secondary | ICD-10-CM | POA: Diagnosis not present

## 2022-02-11 DIAGNOSIS — Z823 Family history of stroke: Secondary | ICD-10-CM

## 2022-02-11 DIAGNOSIS — I5020 Unspecified systolic (congestive) heart failure: Secondary | ICD-10-CM | POA: Insufficient documentation

## 2022-02-11 DIAGNOSIS — Z7985 Long-term (current) use of injectable non-insulin antidiabetic drugs: Secondary | ICD-10-CM

## 2022-02-11 DIAGNOSIS — I2510 Atherosclerotic heart disease of native coronary artery without angina pectoris: Secondary | ICD-10-CM | POA: Diagnosis present

## 2022-02-11 DIAGNOSIS — I130 Hypertensive heart and chronic kidney disease with heart failure and stage 1 through stage 4 chronic kidney disease, or unspecified chronic kidney disease: Principal | ICD-10-CM | POA: Diagnosis present

## 2022-02-11 DIAGNOSIS — R0602 Shortness of breath: Secondary | ICD-10-CM | POA: Diagnosis present

## 2022-02-11 DIAGNOSIS — Z6836 Body mass index (BMI) 36.0-36.9, adult: Secondary | ICD-10-CM | POA: Diagnosis not present

## 2022-02-11 DIAGNOSIS — Z8249 Family history of ischemic heart disease and other diseases of the circulatory system: Secondary | ICD-10-CM

## 2022-02-11 DIAGNOSIS — Z7902 Long term (current) use of antithrombotics/antiplatelets: Secondary | ICD-10-CM

## 2022-02-11 DIAGNOSIS — T502X6A Underdosing of carbonic-anhydrase inhibitors, benzothiadiazides and other diuretics, initial encounter: Secondary | ICD-10-CM | POA: Diagnosis present

## 2022-02-11 DIAGNOSIS — I739 Peripheral vascular disease, unspecified: Secondary | ICD-10-CM | POA: Diagnosis not present

## 2022-02-11 DIAGNOSIS — N1832 Chronic kidney disease, stage 3b: Secondary | ICD-10-CM | POA: Diagnosis not present

## 2022-02-11 DIAGNOSIS — N184 Chronic kidney disease, stage 4 (severe): Secondary | ICD-10-CM | POA: Diagnosis present

## 2022-02-11 DIAGNOSIS — I5023 Acute on chronic systolic (congestive) heart failure: Secondary | ICD-10-CM | POA: Diagnosis not present

## 2022-02-11 DIAGNOSIS — E7800 Pure hypercholesterolemia, unspecified: Secondary | ICD-10-CM | POA: Diagnosis not present

## 2022-02-11 DIAGNOSIS — I255 Ischemic cardiomyopathy: Secondary | ICD-10-CM | POA: Diagnosis present

## 2022-02-11 DIAGNOSIS — I10 Essential (primary) hypertension: Secondary | ICD-10-CM | POA: Diagnosis not present

## 2022-02-11 DIAGNOSIS — E1122 Type 2 diabetes mellitus with diabetic chronic kidney disease: Secondary | ICD-10-CM | POA: Diagnosis present

## 2022-02-11 DIAGNOSIS — D509 Iron deficiency anemia, unspecified: Secondary | ICD-10-CM | POA: Diagnosis present

## 2022-02-11 DIAGNOSIS — M6281 Muscle weakness (generalized): Secondary | ICD-10-CM | POA: Diagnosis not present

## 2022-02-11 DIAGNOSIS — E1169 Type 2 diabetes mellitus with other specified complication: Secondary | ICD-10-CM | POA: Diagnosis present

## 2022-02-11 DIAGNOSIS — I5043 Acute on chronic combined systolic (congestive) and diastolic (congestive) heart failure: Secondary | ICD-10-CM | POA: Diagnosis present

## 2022-02-11 DIAGNOSIS — I25110 Atherosclerotic heart disease of native coronary artery with unstable angina pectoris: Secondary | ICD-10-CM | POA: Diagnosis present

## 2022-02-11 DIAGNOSIS — E782 Mixed hyperlipidemia: Secondary | ICD-10-CM | POA: Diagnosis present

## 2022-02-11 DIAGNOSIS — I2583 Coronary atherosclerosis due to lipid rich plaque: Secondary | ICD-10-CM | POA: Diagnosis not present

## 2022-02-11 DIAGNOSIS — E785 Hyperlipidemia, unspecified: Secondary | ICD-10-CM | POA: Diagnosis not present

## 2022-02-11 DIAGNOSIS — E119 Type 2 diabetes mellitus without complications: Secondary | ICD-10-CM

## 2022-02-11 DIAGNOSIS — I110 Hypertensive heart disease with heart failure: Secondary | ICD-10-CM | POA: Diagnosis not present

## 2022-02-11 HISTORY — DX: Chronic kidney disease, stage 4 (severe): N18.4

## 2022-02-11 LAB — BASIC METABOLIC PANEL
Anion gap: 8 (ref 5–15)
BUN: 17 mg/dL (ref 8–23)
CO2: 25 mmol/L (ref 22–32)
Calcium: 8.5 mg/dL — ABNORMAL LOW (ref 8.9–10.3)
Chloride: 105 mmol/L (ref 98–111)
Creatinine, Ser: 1.71 mg/dL — ABNORMAL HIGH (ref 0.61–1.24)
GFR, Estimated: 40 mL/min — ABNORMAL LOW (ref >60)
Glucose, Bld: 112 mg/dL — ABNORMAL HIGH (ref 70–99)
Potassium: 4.4 mmol/L (ref 3.5–5.1)
Sodium: 138 mmol/L (ref 135–145)

## 2022-02-11 LAB — CBG MONITORING, ED: Glucose-Capillary: 95 mg/dL (ref 70–99)

## 2022-02-11 LAB — CBC WITH DIFFERENTIAL/PLATELET
Abs Immature Granulocytes: 0.03 10*3/uL (ref 0.00–0.07)
Basophils Absolute: 0 10*3/uL (ref 0.0–0.1)
Basophils Relative: 1 %
Eosinophils Absolute: 0.1 10*3/uL (ref 0.0–0.5)
Eosinophils Relative: 1 %
HCT: 33.9 % — ABNORMAL LOW (ref 39.0–52.0)
Hemoglobin: 11.4 g/dL — ABNORMAL LOW (ref 13.0–17.0)
Immature Granulocytes: 1 %
Lymphocytes Relative: 12 %
Lymphs Abs: 0.8 10*3/uL (ref 0.7–4.0)
MCH: 26.6 pg (ref 26.0–34.0)
MCHC: 33.6 g/dL (ref 30.0–36.0)
MCV: 79.2 fL — ABNORMAL LOW (ref 80.0–100.0)
Monocytes Absolute: 0.6 10*3/uL (ref 0.1–1.0)
Monocytes Relative: 9 %
Neutro Abs: 5 10*3/uL (ref 1.7–7.7)
Neutrophils Relative %: 76 %
Platelets: 231 10*3/uL (ref 150–400)
RBC: 4.28 MIL/uL (ref 4.22–5.81)
RDW: 17.7 % — ABNORMAL HIGH (ref 11.5–15.5)
WBC: 6.5 10*3/uL (ref 4.0–10.5)
nRBC: 0 % (ref 0.0–0.2)

## 2022-02-11 LAB — BRAIN NATRIURETIC PEPTIDE: B Natriuretic Peptide: 3275.7 pg/mL — ABNORMAL HIGH (ref 0.0–100.0)

## 2022-02-11 LAB — TROPONIN I (HIGH SENSITIVITY): Troponin I (High Sensitivity): 75 ng/L — ABNORMAL HIGH (ref <18)

## 2022-02-11 MED ORDER — ATORVASTATIN CALCIUM 80 MG PO TABS
80.0000 mg | ORAL_TABLET | Freq: Every morning | ORAL | Status: DC
  Administered 2022-02-12 – 2022-02-22 (×11): 80 mg via ORAL
  Filled 2022-02-11 (×11): qty 1

## 2022-02-11 MED ORDER — SODIUM CHLORIDE 0.9 % IV SOLN: 250.0000 mL | INTRAVENOUS | Status: DC | PRN

## 2022-02-11 MED ORDER — ENOXAPARIN SODIUM 40 MG/0.4ML IJ SOSY
40.0000 mg | PREFILLED_SYRINGE | INTRAMUSCULAR | Status: DC
  Administered 2022-02-12 – 2022-02-18 (×7): 40 mg via SUBCUTANEOUS
  Filled 2022-02-11 (×7): qty 0.4

## 2022-02-11 MED ORDER — DULAGLUTIDE 1.5 MG/0.5ML ~~LOC~~ SOAJ: 1.5000 mg | SUBCUTANEOUS | Status: DC

## 2022-02-11 MED ORDER — CLOPIDOGREL BISULFATE 75 MG PO TABS
75.0000 mg | ORAL_TABLET | Freq: Every day | ORAL | Status: DC
  Administered 2022-02-12 – 2022-02-22 (×11): 75 mg via ORAL
  Filled 2022-02-11 (×11): qty 1

## 2022-02-11 MED ORDER — SODIUM CHLORIDE 0.9% FLUSH: 3.0000 mL | INTRAVENOUS | Status: DC | PRN

## 2022-02-11 MED ORDER — ONDANSETRON HCL 4 MG/2ML IJ SOLN: 4.0000 mg | Freq: Four times a day (QID) | INTRAMUSCULAR | Status: DC | PRN

## 2022-02-11 MED ORDER — SODIUM CHLORIDE 0.9% FLUSH
3.0000 mL | Freq: Two times a day (BID) | INTRAVENOUS | Status: DC
  Administered 2022-02-12 – 2022-02-14 (×5): 3 mL via INTRAVENOUS

## 2022-02-11 MED ORDER — ACETAMINOPHEN 325 MG PO TABS: 650.0000 mg | ORAL_TABLET | ORAL | Status: DC | PRN

## 2022-02-11 MED ORDER — IRBESARTAN 300 MG PO TABS
300.0000 mg | ORAL_TABLET | Freq: Every day | ORAL | Status: DC
  Administered 2022-02-12 – 2022-02-13 (×2): 300 mg via ORAL
  Filled 2022-02-11 (×2): qty 1

## 2022-02-11 MED ORDER — FUROSEMIDE 10 MG/ML IJ SOLN
60.0000 mg | Freq: Once | INTRAMUSCULAR | Status: AC
  Administered 2022-02-11: 60 mg via INTRAVENOUS
  Filled 2022-02-11: qty 6

## 2022-02-11 MED ORDER — AMLODIPINE BESYLATE 5 MG PO TABS
5.0000 mg | ORAL_TABLET | Freq: Every day | ORAL | Status: DC
  Administered 2022-02-12 (×2): 5 mg via ORAL
  Filled 2022-02-11 (×2): qty 1

## 2022-02-11 MED ORDER — FUROSEMIDE 10 MG/ML IJ SOLN
40.0000 mg | Freq: Every day | INTRAMUSCULAR | Status: DC
  Administered 2022-02-12: 40 mg via INTRAVENOUS
  Filled 2022-02-11: qty 4

## 2022-02-11 NOTE — Assessment & Plan Note (Signed)
>>  ASSESSMENT AND PLAN FOR ACUTE ON CHRONIC SYSTOLIC (CONGESTIVE) HEART FAILURE (HCC) WRITTEN ON 02/22/2022 11:29 AM BY Coralie Keens, MD  Echocardiogram with reduced LV systolic function EF 25 to 30%, global hypokinesis, RV systolic function with moderate reduction, moderate dilatation LA and RA. RVSP 38,0. No significant valvular disease.   Pulmonary hypertension] Acute on chronic core pulmonale.   02/05 cardiac catheterization  RA 9/8 RV 63/2  PA 62/21 mean 37  PCWP 14 Cardiac output 7,8 and index 3.9   Precapillary pulmonary hypertension.   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   Patient was placed on IV furosemide, negative fluid balance was achieved, - 13,993 ml with significant improvement in his symptoms.  Continue medical therapy with carvedilol. After load reduction with hydralazine and isosorbide.  Holding on RAS inh and SGLT 2 inh due to reduced GFR.  Continue diuresis with torsemide po. Ted hose as tolerated at home.   Coronary artery disease, continue antiplatelet therapy with aspirin and clopidogrel.  Acute pulmonary edema with bilateral pleural effusions, (acute). Volume status has improved.  At the time of his discharge his 02 saturation on room air is 97%

## 2022-02-11 NOTE — Assessment & Plan Note (Addendum)
His glucose has remained stable. He has been off insulin therapy.  Continue statin therapy.

## 2022-02-11 NOTE — Assessment & Plan Note (Deleted)
Continue Statin

## 2022-02-11 NOTE — Assessment & Plan Note (Addendum)
Renal function starting to stabilize, with serum cr at 2,43 with K at 4,2 and serum bicarbonate at 27.   Plan to continue holding diuretic therapy and follow up renal function in am. Avoid hypotension and nephrotoxic medications.

## 2022-02-11 NOTE — ED Provider Triage Note (Signed)
Emergency Medicine Provider Triage Evaluation Note  Daniel Warner , a 82 y.o. male  was evaluated in triage.  Pt complains of ear shortness of breath.  It has been persistently worsening over the last month.  Was seen at his primary care at [facility] and encouraged to come to the ED stating that he needed IV Lasix.  Denies history of CHF.  Also endorses orthopnea.  Denies cough.  Shortness of breath is worse with exertion.  Review of Systems  Positive: As above Negative: As above  Physical Exam  BP (!) 159/80   Pulse 85   Temp 98.4 F (36.9 C)   Resp 16   Ht $R'5\' 6"'KW$  (1.676 m)   Wt 103.4 kg   SpO2 95%   BMI 36.80 kg/m  Gen:   Awake, no distress   Resp:  Normal effort, no adventitious breath sounds MSK:   Moves extremities without difficulty  Other:  Bilateral 2+ pitting edema  Medical Decision Making  Medically screening exam initiated at 5:52 PM.  Appropriate orders placed.  MASAI KIDD was informed that the remainder of the evaluation will be completed by another provider, this initial triage assessment does not replace that evaluation, and the importance of remaining in the ED until their evaluation is complete.  Shortness of breath labs ordered   Roylene Reason, Hershal Coria 02/11/22 1753

## 2022-02-11 NOTE — Assessment & Plan Note (Addendum)
Echocardiogram with reduced LV systolic function EF 25 to 30%, global hypokinesis, RV systolic function with moderate reduction, moderate dilatation LA and RA. RVSP 38,0. No significant valvular disease.   Pulmonary hypertension] Acute on chronic core pulmonale.   02/05 cardiac catheterization  RA 9/8 RV 63/2  PA 62/21 mean 37  PCWP 14 Cardiac output 7,8 and index 3.9   Precapillary pulmonary hypertension.   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   Urine output documented 540 ml  Systolic blood pressure is 104 to 120 mmHg.   Continue medical therapy with carvedilol. After load reduction with hydralazine and isosorbide.  Holding on RAS inh and SGLT 2 inh due to reduced GFR.   Coronary artery disease, continue antiplatelet therapy with aspirin and clopidogrel.  Acute pulmonary edema with bilateral pleural effusions, (acute). Volume status has improved.  Continue oxymetry monitoring.  May need supplemental home 02 for pulmonary hypertension.

## 2022-02-11 NOTE — ED Provider Notes (Signed)
[facility] Provider Note   CSN: [id number] Arrival date & time: 02/11/22  1728     History  Chief Complaint  Patient presents with   SOB    Daniel Warner is a 82 y.o. male.  Patient presents the emergency department complaining of worsening shortness of breath over the past month.  Patient was evaluated by urgent care earlier today where chest x-ray showed bilateral pleural effusions.  It was recommended he come to the emergency department for possible IV diuresis.  He endorses worsening lower extremity swelling over at least the past month.  He denies chest pain, abdominal pain, cough, headache, nausea, vomiting, urinary symptoms.  Past medical history includes type II DM, peripheral venous insufficiency, polyneuropathy, chronic diastolic heart failure, hypertension, peripheral artery disease, stage IV CKD  HPI     Home Medications Prior to Admission medications   Medication Sig Start Date End Date Taking? Authorizing Provider  ALLERGY RELIEF 10 MG tablet 1 TABLET BY MOUTH ONCE DAILY. 09/26/18   [provider]  amLODipine (NORVASC) 10 MG tablet Take 10 mg by mouth every morning. 01/23/19   [provider]  atorvastatin (LIPITOR) 20 MG tablet Take 20 mg by mouth daily. Patient not taking: Reported on 07/04/2019 06/11/17   [provider]  atorvastatin (LIPITOR) 40 MG tablet TAKE 1 TABELT BY MOUTH DAILY IN THE MORNING FOR CHOLESTEROL 09/26/18   [provider]  atorvastatin (LIPITOR) 80 MG tablet Take 80 mg by mouth every morning. 11/11/20   [provider]  calcitRIOL (ROCALTROL) 0.25 MCG capsule Take 0.25 mcg by mouth daily. 03/02/17   [provider]  CARAFATE 1 GM/10ML suspension SHAKE LQ AND TK 10 ML PO QD B THE EVE MEAL OES Patient not taking: Reported on 07/04/2019 09/15/17   [provider]  clopidogrel (PLAVIX) 75 MG tablet Take 1 tablet (75 mg total) by mouth daily with  breakfast. 10/18/14   Ulyses Amor, PA-C  Continuous Blood Gluc Receiver (FREESTYLE LIBRE 14 DAY READER) DEVI  03/29/18   [provider]  Continuous Blood Gluc Sensor (FREESTYLE LIBRE 14 DAY SENSOR) MISC  06/27/18   [provider]  famotidine (PEPCID) 20 MG tablet TAKE 2 TABLETS ($RemoveBe'40MG'pQtxiebGw$ ) BY MOUTH AT BEDTIME FOR HEARTBURN 05/02/18   [provider]  furosemide (LASIX) 20 MG tablet Take 20 mg by mouth 2 (two) times daily. 11/11/20   [provider]  glimepiride (AMARYL) 4 MG tablet Take 4 mg by mouth daily with breakfast.    [provider]  ketoconazole (NIZORAL) 2 % cream Apply to both feet and between toes once daily for 6 weeks. 03/07/21   Marzetta Board, DPM  levofloxacin (LEVAQUIN) 250 MG tablet Take by mouth. 02/28/20   [provider]  telmisartan (MICARDIS) 80 MG tablet  06/28/18   [provider]  TRULICITY 1.5 FH/5.4TG SOPN INJECT 1 SUBCUTANEOUSLY ONCE WEEKLY 11/28/18   [provider]      Allergies    Losartan potassium and Lisinopril    Review of Systems   Review of Systems  Constitutional:  Negative for fever.  Respiratory:  Positive for shortness of breath.   Cardiovascular:  Positive for leg swelling. Negative for chest pain.  Gastrointestinal:  Negative for abdominal pain, nausea and vomiting.  Genitourinary:  Negative for dysuria.  Neurological:  Negative for headaches.    Physical Exam Updated Vital Signs BP (!) 158/74   Pulse 75   Temp 98.2  F (36.8 C) (Oral)   Resp 18   Ht $R'5\' 6"'Jo$  (1.676 m)   Wt 103.4 kg   SpO2 100%   BMI 36.80 kg/m  Physical Exam Vitals and nursing note reviewed.  Constitutional:      General: He is not in acute distress.    Appearance: He is well-developed.  HENT:     Head: Normocephalic and atraumatic.  Eyes:     Conjunctiva/sclera: Conjunctivae normal.  Cardiovascular:     Rate and Rhythm: Normal rate and regular rhythm.     Heart sounds: No murmur  heard. Pulmonary:     Effort: Pulmonary effort is normal. No respiratory distress.     Breath sounds: Examination of the right-lower field reveals decreased breath sounds. Examination of the left-lower field reveals decreased breath sounds. Decreased breath sounds present.  Abdominal:     Palpations: Abdomen is soft.     Tenderness: There is no abdominal tenderness.  Musculoskeletal:        General: No swelling.     Cervical back: Neck supple.     Right lower leg: Edema present.     Left lower leg: Edema present.     Comments: Bilateral 2+ pitting edema  Skin:    General: Skin is warm and dry.     Capillary Refill: Capillary refill takes less than 2 seconds.  Neurological:     Mental Status: He is alert.  Psychiatric:        Mood and Affect: Mood normal.     ED Results / Procedures / Treatments   Labs (all labs ordered are listed, but only abnormal results are displayed) Labs Reviewed  BASIC METABOLIC PANEL - Abnormal; Notable for the following components:      Result Value   Glucose, Bld 112 (*)    Creatinine, Ser 1.71 (*)    Calcium 8.5 (*)    GFR, Estimated 40 (*)    All other components within normal limits  CBC WITH DIFFERENTIAL/PLATELET - Abnormal; Notable for the following components:   Hemoglobin 11.4 (*)    HCT 33.9 (*)    MCV 79.2 (*)    RDW 17.7 (*)    All other components within normal limits  BRAIN NATRIURETIC PEPTIDE - Abnormal; Notable for the following components:   B Natriuretic Peptide 3,275.7 (*)    All other components within normal limits  TROPONIN I (HIGH SENSITIVITY) - Abnormal; Notable for the following components:   Troponin I (High Sensitivity) 75 (*)    All other components within normal limits  CBG MONITORING, ED  TROPONIN I (HIGH SENSITIVITY)    EKG EKG Interpretation  Date/Time:  Wednesday February 11 2022 17:37:35 EST Ventricular Rate:  81 PR Interval:  262 QRS Duration: 92 QT Interval:  356 QTC Calculation: 413 R  Axis:   99 Text Interpretation: Sinus rhythm with 1st degree A-V block Rightward axis Possible Inferior infarct , age undetermined Abnormal ECG When compared with ECG of 16-Oct-2014 10:30, PREVIOUS ECG IS PRESENT Confirmed by Wandra Arthurs [phone]) on 02/11/2022 8:29:17 PM  Radiology DG Chest 2 View  Result Date: 02/11/2022 CLINICAL DATA:  Shortness of breath. EXAM: CHEST - 2 VIEW COMPARISON:  Chest two views 02/11/2022, earlier today, 03/14/2021 FINDINGS: Mildly decreased lung volumes, similar to 2 prior radiograph 2 hours earlier. The cardiac silhouette is again partially obscured. Moderate atherosclerotic calcifications within the aortic arch. Mild-to-moderate left and mild right pleural effusions are unchanged. Associated left greater than right basilar atelectasis. No pneumothorax. No  acute skeletal abnormality. IMPRESSION: Mild-to-moderate left and mild right pleural effusions and associated left greater than right basilar atelectasis are unchanged from radiograph earlier today. Electronically Signed   By: Yvonne Kendall M.D.   On: 02/11/2022 18:32    Procedures Procedures    Medications Ordered in ED Medications  furosemide (LASIX) injection 60 mg (60 mg Intravenous Given 02/11/22 2038)    ED Course/ Medical Decision Making/ A&P                             Medical Decision Making Risk Prescription drug management.   This patient presents to the ED for concern of shortness of breath, this involves an extensive number of treatment options, and is a complaint that carries with it a high risk of complications and morbidity.  The differential diagnosis includes fluid overload, CHF exacerbation, pneumonia, and others   Co morbidities that complicate the patient evaluation  History diastolic heart failure, hypertension, diabetes, stage IV CKD   Additional history obtained:  Additional history obtained from patient's wife External records from outside source obtained and reviewed  including chest x-ray results from urgent care from earlier today   Lab Tests:  I Ordered, and personally interpreted labs.  The pertinent results include: BNP 3275.7, initial troponin 75, creatinine 1.71 (no recent values to compare to, known stage IV kidney disease) grossly unremarkable CBC   Imaging Studies ordered:  I ordered imaging studies including chest x-ray I independently visualized and interpreted imaging which showed  Mild-to-moderate left and mild right pleural effusions and  associated left greater than right basilar atelectasis   I agree with the radiologist interpretation   Cardiac Monitoring: / EKG:  The patient was maintained on a cardiac monitor.  I personally viewed and interpreted the cardiac monitored which showed an underlying rhythm of: Sinus rhythm with first-degree AV block   Consultations Obtained:  I requested consultation with the hospitalist, Dr.Gardner and discussed lab and imaging findings as well as pertinent plan - they recommend: admission   Problem List / ED Course / Critical interventions / Medication management   I ordered medication including Lasix for fluid overload  I have reviewed the patients home medicines and have made adjustments as needed     Test / Admission - Considered:  Patient has apparent fluid overload with significantly elevated BNP and bilateral pleural effusions on chest x-ray.  Patient will need admission for further diuresis and further evaluation of his heart failure including possible echocardiogram while admitted.  Patient endorses being prescribed Lasix to be taken twice daily but states that he has been taking this only every other day due to frequent urination at night causing him to get too little sleep.  This is likely contributing to his fluid overload.        Final Clinical Impression(s) / ED Diagnoses Final diagnoses:  Elevated brain natriuretic peptide (BNP) level  Shortness of breath    Rx /  DC Orders ED Discharge Orders     None         Ronny Bacon 02/11/22 2104    Drenda Freeze, MD 02/11/22 2222

## 2022-02-11 NOTE — H&P (Addendum)
History and Physical    Patient: Daniel Warner TMA:[id number] DOB: March 23, 1940 DOA: 02/11/2022 DOS: the patient was seen and examined on 02/11/2022 PCP: Kathalene Frames, MD  Patient coming from: Home  Chief Complaint:  Chief Complaint  Patient presents with   SOB   HPI: Daniel Warner is a 82 y.o. male with medical history significant of dCHF, CKD 4, DM2, HTN.  Pt in to ED with SOB, progressively worsening for past month.  At UC earlier today with B pleural effusions on CXR.  Sent in to ED for IV diuresis.  Confirms worsening BLE edema over past month.  No CP, abd pain, cough.  Admits that he is prescribed torsemide daily but only takes every other day due to it making him get up at night to urinate.   Review of Systems: As mentioned in the history of present illness. All other systems reviewed and are negative. Past Medical History:  Diagnosis Date   Benign hypertension with chronic kidney disease, stage III (HCC)    Carotid artery disease (HCC)    CKD (chronic kidney disease), stage III (HCC)    Diabetes mellitus type 2 with peripheral artery disease ([place])    Diabetes mellitus without complication ([place])    Discolored skin    Right leg, thinks it's from a bag that has rubbed against his leg for so long. Doppler 12/2010   GERD (gastroesophageal reflux disease)    Hypercholesterolemia    Hyperlipidemia    Numbness of toes    In the morning   Obesity    Peripheral arterial disease ([place])    Past Surgical History:  Procedure Laterality Date   PERIPHERAL VASCULAR CATHETERIZATION N/A 10/16/2014   Procedure: Abdominal Aortogram w/Lower Extremity;  Surgeon: Serafina Mitchell, MD;  Location: [facility];  Service: Cardiovascular;  Laterality: N/A;   TONSILLECTOMY     WISDOM TOOTH EXTRACTION     Social History:  reports that he has never smoked. He has never used smokeless tobacco. He reports that he does not drink alcohol and does not use drugs.  Allergies   Allergen Reactions   Losartan Potassium     Other reaction(s): itching and hand swelling   Lisinopril     Other reaction(s): Cough    Family History  Problem Relation Age of Onset   Stroke Father    Irritable bowel syndrome Mother    Heart disease Paternal Aunt        CABG   Heart disease Cousin        CABG    Prior to Admission medications   Medication Sig Start Date End Date Taking? Authorizing Provider  amLODipine (NORVASC) 5 MG tablet Take 5 mg by mouth at bedtime. 01/23/19  Yes [provider]  atorvastatin (LIPITOR) 80 MG tablet Take 80 mg by mouth every morning. 11/11/20  Yes [provider]  calcitRIOL (ROCALTROL) 0.25 MCG capsule Take 0.25 mcg by mouth daily. 03/02/17  Yes [provider]  clopidogrel (PLAVIX) 75 MG tablet Take 1 tablet (75 mg total) by mouth daily with breakfast. 10/18/14  Yes Laurence Slate M, PA-C  telmisartan (MICARDIS) 80 MG tablet Take 80 mg by mouth at bedtime. 06/28/18  Yes [provider]  torsemide (DEMADEX) 20 MG tablet Take 20 mg by mouth daily. 09/16/21  Yes [provider]  TRULICITY 1.5 YB/6.3SL SOPN Inject 1.5 mg into the skin once a week. Saturday or Sunday 11/28/18  Yes [provider]  Continuous Blood Gluc  Receiver (FREESTYLE LIBRE 14 DAY READER) DEVI  03/29/18   [provider]  Continuous Blood Gluc Sensor (FREESTYLE LIBRE 14 DAY SENSOR) MISC  06/27/18   [provider]    Physical Exam: Vitals:   02/11/22 1735 02/11/22 1738 02/11/22 2000 02/11/22 2045  BP: (!) 159/80  (!) 176/99 (!) 158/74  Pulse: 85  84 75  Resp: $Remo'16  19 18  'chgVc$ Temp: 98.4 F (36.9 C)  98.2 F (36.8 C)   TempSrc:   Oral   SpO2: 95%  94% 100%  Weight:  103.4 kg    Height:  $Remove'5\' 6"'ZQDvnby$  (1.676 m)     Constitutional: NAD, calm, comfortable Neck: normal, supple, no masses, no thyromegaly Respiratory: Diminished B bases Cardiovascular: Regular rate and rhythm, 2-3+ BLE edema.  Data Reviewed:    BNP  3275    Latest Ref Rng & Units 02/11/2022    5:56 PM 10/17/2014    4:02 AM 10/15/2014    5:23 PM  BMP  Glucose 70 - 99 mg/dL 112  128  131   BUN 8 - 23 mg/dL $Remove'17  13  15   'doOkXlz$ Creatinine 0.61 - 1.24 mg/dL 1.71  1.38  1.46   Sodium 135 - 145 mmol/L 138  136  133   Potassium 3.5 - 5.1 mmol/L 4.4  4.8  4.7   Chloride 98 - 111 mmol/L 105  106  100   CO2 22 - 32 mmol/L $RemoveB'25  22  21   'DhqlyXQv$ Calcium 8.9 - 10.3 mg/dL 8.5  8.6  9.0    Trop 75  CXR = IMPRESSION: Mild-to-moderate left and mild right pleural effusions and associated left greater than right basilar atelectasis are unchanged from radiograph earlier today.   EKG = Q waves in inferior leads, last EKG available in system from 2016 however.  Assessment and Plan: * Acute on chronic diastolic CHF (congestive heart failure) (HCC) Decompensation due to not taking diuretic as often as prescribed. CHF pathway Tele montior Lasix $RemoveBefor'40mg'sGexvURmyXEy$  IV Daily for the moment BMP daily 2d echo Pt suggests possibly getting a condom catheter for home so he can take diuretics without having to get up at night to go urinate.  Possibly DME script for this?  Chronic kidney disease (CKD), stage IV (severe) (HCC) Creat of 1.7 today with GFR of 40 is presumably at baseline given that billing records from Dr. Arty Baumgartner in care everywhere indicate CKD stage 4 as of 09/05/21 office visit. Monitor daily BMP with diuresis.  Type 2 diabetes mellitus (HCC) Continue Trulicity weekly.  Mixed hyperlipidemia Continue Statin  Essential hypertension Continue home BP meds      Advance Care Planning:   Code Status: Full Code  Consults: None  Family Communication: Family at bedside  Severity of Illness: The appropriate patient status for this patient is OBSERVATION. Observation status is judged to be reasonable and necessary in order to provide the required intensity of service to ensure the patient's safety. The patient's presenting symptoms, physical exam findings, and  initial radiographic and laboratory data in the context of their medical condition is felt to place them at decreased risk for further clinical deterioration. Furthermore, it is anticipated that the patient will be medically stable for discharge from the hospital within 2 midnights of admission.   Author: Etta Quill., DO 02/11/2022 10:47 PM  For on call review [email or web address].

## 2022-02-11 NOTE — ED Triage Notes (Signed)
Pt came in via POV for SOB that has gradually gotten worse over the past month. Pt denies Hx of asthma or COPD, only reports DM. Denies pain, A/Ox4.

## 2022-02-11 NOTE — Assessment & Plan Note (Addendum)
Blood pressure control with carvedilol, hydralazine and isosorbide.

## 2022-02-12 ENCOUNTER — Inpatient Hospital Stay (HOSPITAL_COMMUNITY): Payer: PPO

## 2022-02-12 DIAGNOSIS — N184 Chronic kidney disease, stage 4 (severe): Secondary | ICD-10-CM | POA: Diagnosis present

## 2022-02-12 DIAGNOSIS — I5031 Acute diastolic (congestive) heart failure: Secondary | ICD-10-CM

## 2022-02-12 DIAGNOSIS — J9811 Atelectasis: Secondary | ICD-10-CM | POA: Diagnosis present

## 2022-02-12 DIAGNOSIS — R0602 Shortness of breath: Secondary | ICD-10-CM | POA: Diagnosis present

## 2022-02-12 DIAGNOSIS — N1832 Chronic kidney disease, stage 3b: Secondary | ICD-10-CM | POA: Diagnosis not present

## 2022-02-12 DIAGNOSIS — E669 Obesity, unspecified: Secondary | ICD-10-CM | POA: Diagnosis present

## 2022-02-12 DIAGNOSIS — I509 Heart failure, unspecified: Secondary | ICD-10-CM

## 2022-02-12 DIAGNOSIS — I2510 Atherosclerotic heart disease of native coronary artery without angina pectoris: Secondary | ICD-10-CM | POA: Diagnosis present

## 2022-02-12 DIAGNOSIS — D509 Iron deficiency anemia, unspecified: Secondary | ICD-10-CM | POA: Diagnosis present

## 2022-02-12 DIAGNOSIS — I5043 Acute on chronic combined systolic (congestive) and diastolic (congestive) heart failure: Secondary | ICD-10-CM | POA: Diagnosis present

## 2022-02-12 DIAGNOSIS — E1122 Type 2 diabetes mellitus with diabetic chronic kidney disease: Secondary | ICD-10-CM | POA: Diagnosis present

## 2022-02-12 DIAGNOSIS — T502X6A Underdosing of carbonic-anhydrase inhibitors, benzothiadiazides and other diuretics, initial encounter: Secondary | ICD-10-CM | POA: Diagnosis present

## 2022-02-12 DIAGNOSIS — E782 Mixed hyperlipidemia: Secondary | ICD-10-CM | POA: Diagnosis present

## 2022-02-12 DIAGNOSIS — I10 Essential (primary) hypertension: Secondary | ICD-10-CM | POA: Diagnosis not present

## 2022-02-12 DIAGNOSIS — Z6836 Body mass index (BMI) 36.0-36.9, adult: Secondary | ICD-10-CM | POA: Diagnosis not present

## 2022-02-12 DIAGNOSIS — Z888 Allergy status to other drugs, medicaments and biological substances status: Secondary | ICD-10-CM | POA: Diagnosis not present

## 2022-02-12 DIAGNOSIS — I739 Peripheral vascular disease, unspecified: Secondary | ICD-10-CM | POA: Diagnosis not present

## 2022-02-12 DIAGNOSIS — K219 Gastro-esophageal reflux disease without esophagitis: Secondary | ICD-10-CM | POA: Diagnosis present

## 2022-02-12 DIAGNOSIS — I255 Ischemic cardiomyopathy: Secondary | ICD-10-CM | POA: Diagnosis present

## 2022-02-12 DIAGNOSIS — I5023 Acute on chronic systolic (congestive) heart failure: Secondary | ICD-10-CM | POA: Diagnosis not present

## 2022-02-12 DIAGNOSIS — I959 Hypotension, unspecified: Secondary | ICD-10-CM | POA: Diagnosis not present

## 2022-02-12 DIAGNOSIS — E1142 Type 2 diabetes mellitus with diabetic polyneuropathy: Secondary | ICD-10-CM | POA: Diagnosis present

## 2022-02-12 DIAGNOSIS — E7800 Pure hypercholesterolemia, unspecified: Secondary | ICD-10-CM | POA: Diagnosis not present

## 2022-02-12 DIAGNOSIS — I130 Hypertensive heart and chronic kidney disease with heart failure and stage 1 through stage 4 chronic kidney disease, or unspecified chronic kidney disease: Secondary | ICD-10-CM | POA: Diagnosis present

## 2022-02-12 DIAGNOSIS — I700 Atherosclerosis of aorta: Secondary | ICD-10-CM | POA: Diagnosis present

## 2022-02-12 DIAGNOSIS — Z823 Family history of stroke: Secondary | ICD-10-CM | POA: Diagnosis not present

## 2022-02-12 DIAGNOSIS — Z79899 Other long term (current) drug therapy: Secondary | ICD-10-CM | POA: Diagnosis not present

## 2022-02-12 DIAGNOSIS — Y92009 Unspecified place in unspecified non-institutional (private) residence as the place of occurrence of the external cause: Secondary | ICD-10-CM | POA: Diagnosis not present

## 2022-02-12 DIAGNOSIS — N179 Acute kidney failure, unspecified: Secondary | ICD-10-CM | POA: Diagnosis present

## 2022-02-12 DIAGNOSIS — I272 Pulmonary hypertension, unspecified: Secondary | ICD-10-CM | POA: Diagnosis present

## 2022-02-12 DIAGNOSIS — I25110 Atherosclerotic heart disease of native coronary artery with unstable angina pectoris: Secondary | ICD-10-CM | POA: Diagnosis present

## 2022-02-12 DIAGNOSIS — E1169 Type 2 diabetes mellitus with other specified complication: Secondary | ICD-10-CM | POA: Diagnosis present

## 2022-02-12 DIAGNOSIS — E1151 Type 2 diabetes mellitus with diabetic peripheral angiopathy without gangrene: Secondary | ICD-10-CM | POA: Diagnosis present

## 2022-02-12 DIAGNOSIS — J90 Pleural effusion, not elsewhere classified: Secondary | ICD-10-CM | POA: Diagnosis not present

## 2022-02-12 DIAGNOSIS — I5033 Acute on chronic diastolic (congestive) heart failure: Secondary | ICD-10-CM | POA: Diagnosis not present

## 2022-02-12 LAB — BASIC METABOLIC PANEL
Anion gap: 9 (ref 5–15)
BUN: 15 mg/dL (ref 8–23)
CO2: 26 mmol/L (ref 22–32)
Calcium: 8.4 mg/dL — ABNORMAL LOW (ref 8.9–10.3)
Chloride: 105 mmol/L (ref 98–111)
Creatinine, Ser: 1.73 mg/dL — ABNORMAL HIGH (ref 0.61–1.24)
GFR, Estimated: 39 mL/min — ABNORMAL LOW (ref >60)
Glucose, Bld: 88 mg/dL (ref 70–99)
Potassium: 4.1 mmol/L (ref 3.5–5.1)
Sodium: 140 mmol/L (ref 135–145)

## 2022-02-12 LAB — ECHOCARDIOGRAM COMPLETE
Area-P 1/2: 4.06 cm2
Height: 66 in
S' Lateral: 4.2 cm
Weight: 3488.56 oz

## 2022-02-12 LAB — HEMOGLOBIN A1C
Hgb A1c MFr Bld: 5.6 % (ref 4.8–5.6)
Mean Plasma Glucose: 114.02 mg/dL

## 2022-02-12 LAB — GLUCOSE, CAPILLARY
Glucose-Capillary: 130 mg/dL — ABNORMAL HIGH (ref 70–99)
Glucose-Capillary: 109 mg/dL — ABNORMAL HIGH (ref 70–99)
Glucose-Capillary: 125 mg/dL — ABNORMAL HIGH (ref 70–99)

## 2022-02-12 MED ORDER — FUROSEMIDE 10 MG/ML IJ SOLN
40.0000 mg | Freq: Two times a day (BID) | INTRAMUSCULAR | Status: DC
  Administered 2022-02-12 – 2022-02-13 (×2): 40 mg via INTRAVENOUS
  Filled 2022-02-12 (×2): qty 4

## 2022-02-12 MED ORDER — PERFLUTREN LIPID MICROSPHERE
1.0000 mL | INTRAVENOUS | Status: AC | PRN
  Administered 2022-02-12: 3 mL via INTRAVENOUS

## 2022-02-12 MED ORDER — INSULIN ASPART 100 UNIT/ML IJ SOLN: 0.0000 [IU] | Freq: Every day | INTRAMUSCULAR | Status: DC

## 2022-02-12 MED ORDER — INSULIN ASPART 100 UNIT/ML IJ SOLN
0.0000 [IU] | Freq: Three times a day (TID) | INTRAMUSCULAR | Status: DC
  Administered 2022-02-12 – 2022-02-15 (×4): 1 [IU] via SUBCUTANEOUS
  Administered 2022-02-16: 2 [IU] via SUBCUTANEOUS
  Administered 2022-02-17 – 2022-02-18 (×2): 1 [IU] via SUBCUTANEOUS

## 2022-02-12 NOTE — Progress Notes (Signed)
  Echocardiogram 2D Echocardiogram has been performed.  Daniel Warner 02/12/2022, 3:57 PM

## 2022-02-12 NOTE — ED Notes (Signed)
Pt boosted in bed, canister for male purewick changed out 1000cc of urine noted

## 2022-02-12 NOTE — ED Notes (Signed)
ED TO INPATIENT HANDOFF REPORT  ED Nurse Name and Phone #: Vincente Liberty [phone]  S Name/Age/Gender Daniel Warner 82 y.o. male Room/Bed: 038C/038C  Code Status   Code Status: Full Code  Home/SNF/Other Home Patient oriented to: self, place, time, and situation Is this baseline? Yes   Triage Complete: Triage complete  Chief Complaint Acute on chronic diastolic CHF (congestive heart failure) ([place]) [I50.33]  Triage Note Pt came in via [facility] for SOB that has gradually gotten worse over the past month. Pt denies Hx of asthma or COPD, only reports DM. Denies pain, A/Ox4.   Allergies Allergies  Allergen Reactions   Losartan Potassium     Other reaction(s): itching and hand swelling   Lisinopril     Other reaction(s): Cough    Level of Care/Admitting Diagnosis ED Disposition     ED Disposition  Admit   Condition  --   Comment  Hospital Area: [facility] [[id number]]  Level of Care: Telemetry Cardiac [103]  May place patient in observation at [facility] or [place] if equivalent level of care is available:: No  Covid Evaluation: Asymptomatic - no recent exposure (last 10 days) testing not required  Diagnosis: Acute on chronic diastolic CHF (congestive heart failure) [facility]) [[id number]]  Admitting Physician: Etta Quill [phone]  Attending Physician: Etta Quill [phone]          B Medical/Surgery History Past Medical History:  Diagnosis Date   Benign hypertension with chronic kidney disease, stage III ([place])    Carotid artery disease ([place])    CKD (chronic kidney disease), stage III ([place])    Diabetes mellitus type 2 with peripheral artery disease ([place])    Diabetes mellitus without complication ([place])    Discolored skin    Right leg, thinks it's from a bag that has rubbed against his leg for so long. Doppler 12/2010   GERD (gastroesophageal reflux disease)    Hypercholesterolemia    Hyperlipidemia    Numbness of toes    In the morning   Obesity     Peripheral arterial disease ([place])    Past Surgical History:  Procedure Laterality Date   PERIPHERAL VASCULAR CATHETERIZATION N/A 10/16/2014   Procedure: Abdominal Aortogram w/Lower Extremity;  Surgeon: Serafina Mitchell, MD;  Location: [facility];  Service: Cardiovascular;  Laterality: N/A;   TONSILLECTOMY     WISDOM TOOTH EXTRACTION       A IV Location/Drains/Wounds Patient Lines/Drains/Airways Status     Active Line/Drains/Airways     Name Placement date Placement time Site Days   Peripheral IV 02/11/22 20 G 1" Posterior;Right Hand 02/11/22  2029  Hand  1   Incision (Closed) 10/16/14 Groin Right 10/16/14  1930  -- 2676            Intake/Output Last 24 hours  Intake/Output Summary (Last 24 hours) at 02/12/2022 1051 Last data filed at 02/12/2022 0725 Gross per 24 hour  Intake --  Output 1000 ml  Net -1000 ml    Labs/Imaging Results for orders placed or performed during the hospital encounter of 02/11/22 (from the past 48 hour(s))  Basic metabolic panel     Status: Abnormal   Collection Time: 02/11/22  5:56 PM  Result Value Ref Range   Sodium 138 135 - 145 mmol/L   Potassium 4.4 3.5 - 5.1 mmol/L   Chloride 105 98 - 111 mmol/L   CO2 25 22 - 32 mmol/L   Glucose, Bld 112 (H) 70 -  99 mg/dL    Comment: Glucose reference range applies only to samples taken after fasting for at least 8 hours.   BUN 17 8 - 23 mg/dL   Creatinine, Ser 1.71 (H) 0.61 - 1.24 mg/dL   Calcium 8.5 (L) 8.9 - 10.3 mg/dL   GFR, Estimated 40 (L) >60 mL/min    Comment: (NOTE) Calculated using the CKD-EPI Creatinine Equation (2021)    Anion gap 8 5 - 15    Comment: Performed at [facility] [address], [place], [place] [address]  CBC with Differential     Status: Abnormal   Collection Time: 02/11/22  5:56 PM  Result Value Ref Range   WBC 6.5 4.0 - 10.5 K/uL   RBC 4.28 4.22 - 5.81 MIL/uL   Hemoglobin 11.4 (L) 13.0 - 17.0 g/dL   HCT 33.9 (L) 39.0 - 52.0 %   MCV 79.2 (L) 80.0 -  100.0 fL   MCH 26.6 26.0 - 34.0 pg   MCHC 33.6 30.0 - 36.0 g/dL   RDW 17.7 (H) 11.5 - 15.5 %   Platelets 231 150 - 400 K/uL   nRBC 0.0 0.0 - 0.2 %   Neutrophils Relative % 76 %   Neutro Abs 5.0 1.7 - 7.7 K/uL   Lymphocytes Relative 12 %   Lymphs Abs 0.8 0.7 - 4.0 K/uL   Monocytes Relative 9 %   Monocytes Absolute 0.6 0.1 - 1.0 K/uL   Eosinophils Relative 1 %   Eosinophils Absolute 0.1 0.0 - 0.5 K/uL   Basophils Relative 1 %   Basophils Absolute 0.0 0.0 - 0.1 K/uL   Immature Granulocytes 1 %   Abs Immature Granulocytes 0.03 0.00 - 0.07 K/uL    Comment: Performed at [facility], [address] [place], [place] [address]  Brain natriuretic peptide     Status: Abnormal   Collection Time: 02/11/22  5:56 PM  Result Value Ref Range   B Natriuretic Peptide 3,275.7 (H) 0.0 - 100.0 pg/mL    Comment: Performed at [facility] [address], [place], [place] [address]  Troponin I (High Sensitivity)     Status: Abnormal   Collection Time: 02/11/22  5:56 PM  Result Value Ref Range   Troponin I (High Sensitivity) 75 (H) <18 ng/L    Comment: (NOTE) Elevated high sensitivity troponin I (hsTnI) values and significant  changes across serial measurements may suggest ACS but many other  chronic and acute conditions are known to elevate hsTnI results.  Refer to the "Links" section for chest pain algorithms and additional  guidance. Performed at [facility], [place] [address] [place], [place] [address]   CBG monitoring, ED     Status: None   Collection Time: 02/11/22  8:48 PM  Result Value Ref Range   Glucose-Capillary 95 70 - 99 mg/dL    Comment: Glucose reference range applies only to samples taken after fasting for at least 8 hours.  Basic metabolic panel     Status: Abnormal   Collection Time: 02/12/22  4:45 AM  Result Value Ref Range   Sodium 140 135 - 145 mmol/L   Potassium 4.1 3.5 - 5.1 mmol/L   Chloride 105 98 - 111 mmol/L   CO2 26 22 - 32 mmol/L   Glucose, Bld  88 70 - 99 mg/dL    Comment: Glucose reference range applies only to samples taken after fasting for at least 8 hours.   BUN 15 8 -  23 mg/dL   Creatinine, Ser 1.73 (H) 0.61 - 1.24 mg/dL   Calcium 8.4 (L) 8.9 - 10.3 mg/dL   GFR, Estimated 39 (L) >60 mL/min    Comment: (NOTE) Calculated using the CKD-EPI Creatinine Equation (2021)    Anion gap 9 5 - 15    Comment: Performed at [facility] [address], [place], [place] [address]   DG Chest 2 View  Result Date: 02/11/2022 CLINICAL DATA:  Shortness of breath. EXAM: CHEST - 2 VIEW COMPARISON:  Chest two views 02/11/2022, earlier today, 03/14/2021 FINDINGS: Mildly decreased lung volumes, similar to 2 prior radiograph 2 hours earlier. The cardiac silhouette is again partially obscured. Moderate atherosclerotic calcifications within the aortic arch. Mild-to-moderate left and mild right pleural effusions are unchanged. Associated left greater than right basilar atelectasis. No pneumothorax. No acute skeletal abnormality. IMPRESSION: Mild-to-moderate left and mild right pleural effusions and associated left greater than right basilar atelectasis are unchanged from radiograph earlier today. Electronically Signed   By: Yvonne Kendall M.D.   On: 02/11/2022 18:32    Pending Labs Unresulted Labs (From admission, onward)     Start     Ordered   02/12/22 0956  Hemoglobin A1c  Once,   R       Comments: To assess prior glycemic control    02/12/22 0956   02/12/22 0321  Basic metabolic panel  Daily,   R     Comments: As Scheduled for 5 days    02/11/22 2353            Vitals/Pain Today's Vitals   02/12/22 0830 02/12/22 0845 02/12/22 0900 02/12/22 0930  BP: 112/65  125/60 130/64  Pulse: 93  89 80  Resp: (!) 24  20 (!) 28  Temp:      TempSrc:      SpO2: 95% 94%  93%  Weight:      Height:      PainSc:        Isolation Precautions No active isolations  Medications Medications  atorvastatin (LIPITOR) tablet 80 mg (80 mg Oral  Given 02/12/22 0904)  clopidogrel (PLAVIX) tablet 75 mg (75 mg Oral Given 02/12/22 0904)  irbesartan (AVAPRO) tablet 300 mg (300 mg Oral Given 02/12/22 0904)  amLODipine (NORVASC) tablet 5 mg (5 mg Oral Given 02/12/22 0018)  sodium chloride flush (NS) 0.9 % injection 3 mL (3 mLs Intravenous Given 02/12/22 0946)  sodium chloride flush (NS) 0.9 % injection 3 mL (has no administration in time range)  0.9 %  sodium chloride infusion (has no administration in time range)  acetaminophen (TYLENOL) tablet 650 mg (has no administration in time range)  ondansetron (ZOFRAN) injection 4 mg (has no administration in time range)  enoxaparin (LOVENOX) injection 40 mg (40 mg Subcutaneous Given 02/12/22 0905)  furosemide (LASIX) injection 40 mg (has no administration in time range)  insulin aspart (novoLOG) injection 0-9 Units (has no administration in time range)  insulin aspart (novoLOG) injection 0-5 Units (has no administration in time range)  furosemide (LASIX) injection 60 mg (60 mg Intravenous Given 02/11/22 2038)    Mobility [facility] had pt up with me      Focused Assessments    R Recommendations: See Admitting Provider Note  Report given to:   Additional Notes: pt has 3+ pitting edema non-compliant with home lasix

## 2022-02-12 NOTE — ED Notes (Signed)
MD at bedside. 

## 2022-02-12 NOTE — Progress Notes (Signed)
Pt arrived from ..ED..., A/ox .4.Marland Kitchenpt denies any pain, MD aware,CCMD called. CHG bath given,no further needs at this time

## 2022-02-12 NOTE — Progress Notes (Addendum)
PROGRESS NOTE    Daniel Warner  TOI:[id number] DOB: 12-25-1940 DOA: 02/11/2022 PCP: Kathalene Frames, MD   Brief Narrative:  HPI: Daniel Warner is a 82 y.o. male with medical history significant of dCHF, CKD 4, DM2, HTN.   Pt in to ED with SOB, progressively worsening for past month.  At UC earlier today with B pleural effusions on CXR.  Sent in to ED for IV diuresis.   Confirms worsening BLE edema over past month.  No CP, abd pain, cough.   Admits that he is prescribed torsemide daily but only takes every other day due to it making him get up at night to urinate.  Assessment & Plan:   Principal Problem:   Acute on chronic diastolic CHF (congestive heart failure) (HCC) Active Problems:   Chronic kidney disease (CKD), stage IV (severe) (HCC)   Essential hypertension   Mixed hyperlipidemia   Type 2 diabetes mellitus (HCC)  Acute on chronic diastolic congestive heart failure: Patient still feels short of breath however he is on room air.  Only 1 L output since yesterday if that is accurate information, will increase Lasix 40 mg to twice daily with a strict I's and O's and low-sodium diet.  CKD stage IV: Baseline creatinine around 1.7, currently at baseline.  Type 2 diabetes mellitus: On Trulicity at home.  Will start on SSI and check hemoglobin A1c.  Hyperlipidemia: Continue atorvastatin.  Hypertension: Controlled.  Continue amlodipine and telmisartan.  DVT prophylaxis: enoxaparin (LOVENOX) injection 40 mg Start: 02/12/22 1000   Code Status: Full Code  Family Communication: Wife present at bedside.  Plan of care discussed with patient in length and he/she verbalized understanding and agreed with it.  Status is: Observation The patient will require care spanning > 2 midnights and should be moved to inpatient because: Needs IV diuresis   Estimated body mass index is 36.8 kg/m as calculated from the following:   Height as of this encounter: $RemoveBeforeD'5\' 6"'[id number]$  (1.676 m).    Weight as of this encounter: 103.4 kg.    Nutritional Assessment: Body mass index is 36.8 kg/m.Marland Kitchen Seen by dietician.  I agree with the assessment and plan as outlined below: Nutrition Status:        . Skin Assessment: I have examined the patient's skin and I agree with the wound assessment as performed by the wound care RN as outlined below:    Consultants:  None  Procedures:  None  Antimicrobials:  Anti-infectives (From admission, onward)    None         Subjective: Patient seen and examined, wife at the bedside.  Still complains of shortness of breath but slightly improved compared to yesterday.  No other complaint.  Objective: Vitals:   02/12/22 0030 02/12/22 0450 02/12/22 0730 02/12/22 0742  BP: (!) 147/77 139/75 131/69   Pulse: 76 83 85   Resp: $Remo'15 20 17   'NhqsN$ Temp: 98.1 F (36.7 C) 98 F (36.7 C)  97.9 F (36.6 C)  TempSrc: Oral   Oral  SpO2: 100% 91% 94%   Weight:      Height:        Intake/Output Summary (Last 24 hours) at 02/12/2022 0953 Last data filed at 02/12/2022 0725 Gross per 24 hour  Intake --  Output 1000 ml  Net -1000 ml   Filed Weights   02/11/22 1738  Weight: 103.4 kg    Examination:  General exam: Appears calm and comfortable  Respiratory system: Very faint crackles at the  bases with diminished breath sounds. Respiratory effort normal. Cardiovascular system: S1 & S2 heard, RRR. No JVD, murmurs, rubs, gallops or clicks.  +4 pitting edema bilateral lower extremity. Gastrointestinal system: Abdomen is nondistended, soft and nontender. No organomegaly or masses felt. Normal bowel sounds heard. Central nervous system: Alert and oriented. No focal neurological deficits. Extremities: Symmetric 5 x 5 power. Skin: No rashes, lesions or ulcers Psychiatry: Judgement and insight appear normal. Mood & affect appropriate.    Data Reviewed: I have personally reviewed following labs and imaging studies  CBC: Recent Labs  Lab 02/11/22 1756   WBC 6.5  NEUTROABS 5.0  HGB 11.4*  HCT 33.9*  MCV 79.2*  PLT 662   Basic Metabolic Panel: Recent Labs  Lab 02/11/22 1756 02/12/22 0445  NA 138 140  K 4.4 4.1  CL 105 105  CO2 25 26  GLUCOSE 112* 88  BUN 17 15  CREATININE 1.71* 1.73*  CALCIUM 8.5* 8.4*   GFR: Estimated Creatinine Clearance: 37.7 mL/min (A) (by C-G formula based on SCr of 1.73 mg/dL (H)). Liver Function Tests: No results for input(s): "AST", "ALT", "ALKPHOS", "BILITOT", "PROT", "ALBUMIN" in the last 168 hours. No results for input(s): "LIPASE", "AMYLASE" in the last 168 hours. No results for input(s): "AMMONIA" in the last 168 hours. Coagulation Profile: No results for input(s): "INR", "PROTIME" in the last 168 hours. Cardiac Enzymes: No results for input(s): "CKTOTAL", "CKMB", "CKMBINDEX", "TROPONINI" in the last 168 hours. BNP (last 3 results) No results for input(s): "PROBNP" in the last 8760 hours. HbA1C: No results for input(s): "HGBA1C" in the last 72 hours. CBG: Recent Labs  Lab 02/11/22 2048  GLUCAP 95   Lipid Profile: No results for input(s): "CHOL", "HDL", "LDLCALC", "TRIG", "CHOLHDL", "LDLDIRECT" in the last 72 hours. Thyroid Function Tests: No results for input(s): "TSH", "T4TOTAL", "FREET4", "T3FREE", "THYROIDAB" in the last 72 hours. Anemia Panel: No results for input(s): "VITAMINB12", "FOLATE", "FERRITIN", "TIBC", "IRON", "RETICCTPCT" in the last 72 hours. Sepsis Labs: No results for input(s): "PROCALCITON", "LATICACIDVEN" in the last 168 hours.  No results found for this or any previous visit (from the past 240 hour(s)).   Radiology Studies: DG Chest 2 View  Result Date: 02/11/2022 CLINICAL DATA:  Shortness of breath. EXAM: CHEST - 2 VIEW COMPARISON:  Chest two views 02/11/2022, earlier today, 03/14/2021 FINDINGS: Mildly decreased lung volumes, similar to 2 prior radiograph 2 hours earlier. The cardiac silhouette is again partially obscured. Moderate atherosclerotic  calcifications within the aortic arch. Mild-to-moderate left and mild right pleural effusions are unchanged. Associated left greater than right basilar atelectasis. No pneumothorax. No acute skeletal abnormality. IMPRESSION: Mild-to-moderate left and mild right pleural effusions and associated left greater than right basilar atelectasis are unchanged from radiograph earlier today. Electronically Signed   By: Yvonne Kendall M.D.   On: 02/11/2022 18:32    Scheduled Meds:  amLODipine  5 mg Oral QHS   atorvastatin  80 mg Oral q morning   clopidogrel  75 mg Oral Q breakfast   enoxaparin (LOVENOX) injection  40 mg Subcutaneous Q24H   furosemide  40 mg Intravenous Daily   irbesartan  300 mg Oral Daily   sodium chloride flush  3 mL Intravenous Q12H   Continuous Infusions:  sodium chloride       LOS: 0 days   Darliss Cheney, MD [facility]  02/12/2022, 9:53 AM   *Please note that this is a verbal dictation therefore any spelling or grammatical errors are due to the "[place] [employer]"  system interpretation.  Please page via [place] and do not message via secure chat for urgent patient care matters. Secure chat can be used for non urgent patient care matters.  How to contact the [facility] Attending or Consulting provider [place] or covering provider during after hours [place], for this patient?  Check the care team in [facility] and look for a) attending/consulting TRH provider listed and b) the [facility] team listed. Page or secure chat [phone]. Log into [email or web address] and use [facility]'s universal password to access. If you do not have the password, please contact the hospital operator. Locate the [facility] provider you are looking for under [facility] and page to a number that you can be directly reached. If you still have difficulty reaching the provider, please page the [facility] (Director on Call) for the Hospitalists listed on amion for assistance.

## 2022-02-13 ENCOUNTER — Other Ambulatory Visit (HOSPITAL_COMMUNITY): Payer: Self-pay

## 2022-02-13 ENCOUNTER — Encounter (HOSPITAL_COMMUNITY): Payer: Self-pay | Admitting: Family Medicine

## 2022-02-13 DIAGNOSIS — I739 Peripheral vascular disease, unspecified: Secondary | ICD-10-CM

## 2022-02-13 DIAGNOSIS — N1832 Chronic kidney disease, stage 3b: Secondary | ICD-10-CM

## 2022-02-13 DIAGNOSIS — I5043 Acute on chronic combined systolic (congestive) and diastolic (congestive) heart failure: Secondary | ICD-10-CM | POA: Diagnosis not present

## 2022-02-13 DIAGNOSIS — I5041 Acute combined systolic (congestive) and diastolic (congestive) heart failure: Secondary | ICD-10-CM | POA: Insufficient documentation

## 2022-02-13 DIAGNOSIS — N179 Acute kidney failure, unspecified: Secondary | ICD-10-CM

## 2022-02-13 DIAGNOSIS — I5033 Acute on chronic diastolic (congestive) heart failure: Secondary | ICD-10-CM | POA: Diagnosis not present

## 2022-02-13 LAB — HEPATIC FUNCTION PANEL
ALT: 13 U/L (ref 0–44)
AST: 21 U/L (ref 15–41)
Albumin: 2.9 g/dL — ABNORMAL LOW (ref 3.5–5.0)
Alkaline Phosphatase: 109 U/L (ref 38–126)
Bilirubin, Direct: 0.3 mg/dL — ABNORMAL HIGH (ref 0.0–0.2)
Indirect Bilirubin: 0.7 mg/dL (ref 0.3–0.9)
Total Bilirubin: 1 mg/dL (ref 0.3–1.2)
Total Protein: 5.7 g/dL — ABNORMAL LOW (ref 6.5–8.1)

## 2022-02-13 LAB — GLUCOSE, CAPILLARY
Glucose-Capillary: 87 mg/dL (ref 70–99)
Glucose-Capillary: 136 mg/dL — ABNORMAL HIGH (ref 70–99)
Glucose-Capillary: 118 mg/dL — ABNORMAL HIGH (ref 70–99)
Glucose-Capillary: 122 mg/dL — ABNORMAL HIGH (ref 70–99)

## 2022-02-13 LAB — BASIC METABOLIC PANEL
Anion gap: 7 (ref 5–15)
BUN: 20 mg/dL (ref 8–23)
CO2: 27 mmol/L (ref 22–32)
Calcium: 8.3 mg/dL — ABNORMAL LOW (ref 8.9–10.3)
Chloride: 104 mmol/L (ref 98–111)
Creatinine, Ser: 1.85 mg/dL — ABNORMAL HIGH (ref 0.61–1.24)
GFR, Estimated: 36 mL/min — ABNORMAL LOW (ref >60)
Glucose, Bld: 91 mg/dL (ref 70–99)
Potassium: 3.9 mmol/L (ref 3.5–5.1)
Sodium: 138 mmol/L (ref 135–145)

## 2022-02-13 LAB — TROPONIN I (HIGH SENSITIVITY)
Troponin I (High Sensitivity): 904 ng/L (ref <18)
Troponin I (High Sensitivity): 800 ng/L (ref <18)

## 2022-02-13 MED ORDER — FUROSEMIDE 10 MG/ML IJ SOLN
40.0000 mg | Freq: Two times a day (BID) | INTRAMUSCULAR | Status: DC
  Administered 2022-02-13 – 2022-02-14 (×2): 40 mg via INTRAVENOUS
  Filled 2022-02-13 (×2): qty 4

## 2022-02-13 MED ORDER — SODIUM CHLORIDE 0.9% FLUSH
3.0000 mL | Freq: Two times a day (BID) | INTRAVENOUS | Status: DC
  Administered 2022-02-13 – 2022-02-16 (×4): 3 mL via INTRAVENOUS

## 2022-02-13 MED ORDER — ASPIRIN 81 MG PO TBEC
81.0000 mg | DELAYED_RELEASE_TABLET | Freq: Every day | ORAL | Status: DC
  Administered 2022-02-13 – 2022-02-22 (×9): 81 mg via ORAL
  Filled 2022-02-13 (×9): qty 1

## 2022-02-13 MED ORDER — LIVING BETTER WITH HEART FAILURE BOOK: Freq: Once | Status: AC

## 2022-02-13 NOTE — Progress Notes (Addendum)
   Heart Failure Stewardship Pharmacist Progress Note   PCP: Kathalene Frames, MD PCP-Cardiologist: None    HPI:  82 yo M with PMH of T2DM, HTN, HLD, PAD, CKD IV, and carotid artery disease.   He presented to the ED on 1/31 with shortness of breath and LE edema. CXR with mild to moderate L and R pleural effusions. ECHO 2/1 showed LVEF 25-30% with global hypokinesis, G2DD, and RV moderately reduced.   Current HF Medications: Diuretic: furosemide 40 mg IV BID ACE/ARB/ARNI: irbesartan 300 mg daily  Prior to admission HF Medications: Diuretic: torsemide 20 mg daily (patient reports taking every other day) ACE/ARB/ARNI: telmisartan 80 mg daily  Pertinent Lab Values: Serum creatinine 1.85, BUN 20, Potassium 3.9, Sodium 138, BNP 3275.7, A1c 5.6   Vital Signs: Weight: 208 lbs (admission weight: 228 lbs) Blood pressure: 120-140/70s  Heart rate: 70s  I/O: -2.7L yesterday; net -4L  Medication Assistance / Insurance Benefits Check: Does the patient have prescription insurance?  Yes Type of insurance plan: [employer]  Outpatient Pharmacy:  Prior to admission outpatient pharmacy: [facility] Is the patient willing to use [facility] pharmacy at discharge? Yes    Assessment: 1. Acute systolic CHF (LVEF 01-75%). NYHA class III symptoms. - Continue furosemide 40 mg IV BID. Strict I/Os and daily weights. Keep K>4 and check Mg. - Consider starting BB once euvolemic - Continue irbesartan 300 mg daily, consider optimizing to [facility] prior to discharge. He has h/o cough with ACEI and prior itching and hand swelling with losartan, though was on telimsartan as OP and tolerating irbesartan here.  - Consider adding MRA and SGLT2i pending renal function and removal of foley - Agree with stopping amlodipine   Plan: 1) Medication changes recommended at this time: - Continue IV diuresis  2) Patient assistance: - Farxiga/Jardiance copay $0 - Entresto copay $0  3)  Education   - To be completed prior to discharge  Kerby Nora, PharmD, BCPS Heart Failure Stewardship Pharmacist Phone [phone]

## 2022-02-13 NOTE — Consult Note (Addendum)
Cardiology Consultation   Patient ID: [name] MRN: [id number]; DOB: 1940-11-30  Admit date: 02/11/2022 Date of Consult: 02/13/2022  PCP:  Daniel Frames, MD   [place] Providers Cardiologist:  New (remotely Daniel Warner in 1540) Click here to update MD or APP on Care Team, Refresh:1}     Patient Profile:   Daniel Warner is a 82 y.o. male with a hx of DM, HTN, HLD, PAD s/p several prior LE interventions followed by [facility] on Plavix, CKD stage IIIb- IV (baseline Cr 1.66-1.98 in 2023 per Daniel Warner) followed at Daniel Warner, carotid artery disease (1-39% BICA in 2018) who is being seen 02/13/2022 for the evaluation of CHF at the request of Daniel Warner.  History of Present Illness:   Daniel Warner remotely saw Daniel Warner prior to his diagnosis of occlusive PAD which has since been managed by [facility]. Intake notes from medicine team report a history of chronic diastolic CHF, no prior echo on file. Patient was unaware of this diagnosis but does take torsemide chronically, prescribed daily but only takes it every other day due to side effect of frequent urination causing him to get up at night. He remains very active in many evening activities. He teaches nighttime art classes and is active in [employer], amongst other group events.  At baseline he reports chronic edema which has not changed much recently. He's been SOB for several months though getting particularly worse the last 2-3 weeks with intermittent orthopnea. He had not had any CP, palpitations, syncope, or noticed any major weight changes. He was seen at urgent care due to the worsening dyspnea with CXR concerning for pleural effusions so was sent to the ER for evaluation. Here, CXR with mild-mod L pleural, mild R pleural effusion, L>R atx. Admit Cr 1.71, Hgb 11.4 (microcytic), BNP 3275, hsTroponin 75->904->800. Echo yesterday showed EF 25-30%, G2DD, moderately reduced RV function, mildly elevated PASP,  moderate BAE, aortic sclerosis without stenosis, dilated IVC. He has h/o cough with ACEI and prior itching and hand swelling with losartan, though was on telimsartan as OP and tolerating irbesartan here. He was on amlodipine PTA which will be discontinued. He was started on IV Lasix $Remove'40mg'iTsVFma$  BID with gradual improvement. He is particularly happy that with the placement of the catheter, the urination does not wake him up at night. Weights tracked in Epic 228->218->208, -4L thus far. He reports home weight around 218 or so. He only eats once a day out of habit at home, sometimes Daniel Warner's, a lot of low sugar pears, also enjoys ice cream.   Past Medical History:  Diagnosis Date   Carotid artery disease (Daniel Warner)    CKD (chronic kidney disease), stage IV (Daniel Warner)    Diabetes mellitus type 2 with peripheral artery disease (Daniel Warner)    Discolored skin    Right leg, thinks it's from a bag that has rubbed against his leg for so long. Doppler 12/2010   GERD (gastroesophageal reflux disease)    Hypercholesterolemia    Hyperlipidemia    Numbness of toes    In the morning   Obesity    Peripheral arterial disease (Daniel Warner)     Past Surgical History:  Procedure Laterality Date   PERIPHERAL VASCULAR CATHETERIZATION N/A 10/16/2014   Procedure: Abdominal Aortogram w/Lower Extremity;  Surgeon: Serafina Mitchell, MD;  Location: [facility];  Service: Cardiovascular;  Laterality: N/A;   TONSILLECTOMY     WISDOM TOOTH EXTRACTION  Home Medications:  Prior to Admission medications   Medication Sig Start Date End Date Taking? Authorizing Provider  amLODipine (NORVASC) 5 MG tablet Take 5 mg by mouth at bedtime. 01/23/19  Yes [provider]  atorvastatin (LIPITOR) 80 MG tablet Take 80 mg by mouth every morning. 11/11/20  Yes [provider]  calcitRIOL (ROCALTROL) 0.25 MCG capsule Take 0.25 mcg by mouth daily. 03/02/17  Yes [provider]  clopidogrel (PLAVIX) 75 MG tablet Take 1 tablet (75  mg total) by mouth daily with breakfast. 10/18/14  Yes Laurence Slate M, PA-C  telmisartan (MICARDIS) 80 MG tablet Take 80 mg by mouth at bedtime. 06/28/18  Yes [provider]  torsemide (DEMADEX) 20 MG tablet Take 20 mg by mouth daily. 09/16/21  Yes [provider]  TRULICITY 1.5 YT/0.3TW SOPN Inject 1.5 mg into the skin once a week. Saturday or Sunday 11/28/18  Yes [provider]  Continuous Blood Gluc Receiver (FREESTYLE LIBRE 14 DAY Warner) [place]  03/29/18   [provider]  Continuous Blood Gluc Sensor (FREESTYLE LIBRE 14 DAY SENSOR) [place]  06/27/18   [provider]    Inpatient Medications: Scheduled Meds:  atorvastatin  80 mg Oral q morning   clopidogrel  75 mg Oral Q breakfast   enoxaparin (LOVENOX) injection  40 mg Subcutaneous Q24H   furosemide  40 mg Intravenous BID   insulin aspart  0-5 Units Subcutaneous QHS   insulin aspart  0-9 Units Subcutaneous TID WC   irbesartan  300 mg Oral Daily   sodium chloride flush  3 mL Intravenous Q12H   Continuous Infusions:  sodium chloride     PRN Meds: sodium chloride, acetaminophen, ondansetron (ZOFRAN) IV, sodium chloride flush  Allergies:    Allergies  Allergen Reactions   Losartan Potassium     Other reaction(s): itching and hand swelling   Lisinopril     Other reaction(s): Cough    Social History:   Social History   Socioeconomic History   Marital status: Married    Spouse name: Daniel Warner   Number of children: 1   Years of education: Not on file   Highest education level: Not on file  Occupational History   Not on file  Tobacco Use   Smoking status: Never   Smokeless tobacco: Never  Substance and Sexual Activity   Alcohol use: No   Drug use: No   Sexual activity: Not on file  Other Topics Concern   Not on file  Social History Narrative   Not on file   Social Determinants of Health   Financial Resource Strain: Low Risk  (07/15/2018)   Overall Financial Resource Strain  (CARDIA)    Difficulty of Paying Living Expenses: Not hard at all  Food Insecurity: No Food Insecurity (07/04/2019)   Hunger Vital Sign    Worried About Running Out of Food in the Last Year: Never true    [place] in the Last Year: Never true  Transportation Needs: No Transportation Needs (07/15/2018)   PRAPARE - Hydrologist (Medical): No    Warner of Transportation (Non-Medical): No  Physical Activity: Inactive (07/15/2018)   Exercise Vital Sign    Days of Exercise per Week: 0 days    Minutes of Exercise per Session: 0 min  Stress: No Stress Concern Present (07/15/2018)   [place]    Feeling of Stress : Not at all  Social Connections:  Not on file  Intimate Partner Violence: Not on file    Family History:   Family History  Problem Relation Age of Onset   Stroke Father    Irritable bowel syndrome Mother    Heart disease Paternal Aunt        CABG   Heart disease Cousin        CABG     ROS:  Please see the history of present illness.  All other ROS reviewed and negative.     Physical Exam/Data:   Vitals:   02/13/22 0300 02/13/22 0625 02/13/22 0833 02/13/22 1112  BP: (!) 131/96  (!) 149/73 (!) 119/54  Pulse: 74  91 74  Resp: $Remo'13  17 18  'BvjPU$ Temp: 97.8 F (36.6 C)  97.9 F (36.6 C) 97.7 F (36.5 C)  TempSrc: Oral  Axillary Oral  SpO2: 99%  96% 98%  Weight:  94.6 kg    Height:        Intake/Output Summary (Last 24 hours) at 02/13/2022 1413 Last data filed at 02/13/2022 1013 Gross per 24 hour  Intake 290 ml  Output 3350 ml  Net -3060 ml      02/13/2022    6:25 AM 02/12/2022    1:00 PM 02/11/2022    5:38 PM  Last 3 Weights  Weight (lbs) 208 lb 8 oz 218 lb 0.6 oz 228 lb  Weight (kg) 94.575 kg 98.9 kg 103.42 kg     Body mass index is 33.65 kg/m.  General: Well developed, well nourished M in no acute distress. Head: Normocephalic, atraumatic, sclera non-icteric, no xanthomas,  nares are without discharge. Neck: Negative for carotid bruits. JVP elevated Lungs: Decreased BS at bases without wheezes, rales, or rhonchi. Breathing is unlabored. Heart: RRR S1 S2 without murmurs, rubs, or gallops.  Abdomen: Soft, non-tender, non-distended with normoactive bowel sounds. No rebound/guarding. Extremities: No clubbing or cyanosis. Markedly edematous BLE/stiff with striations indicating that legs were likely even more swollen at one point in time. Neuro: Alert and oriented X 3. Moves all extremities spontaneously. Psych:  Responds to questions appropriately with a normal affect.   EKG:  The EKG was personally reviewed and demonstrates:  NSR 81bpm first degree AVB, rightward axis, nonspecific STTW changes Telemetry:  Telemetry was personally reviewed and demonstrates:  NSR  Relevant CV Studies: 2D echo 02/12/22   1. Left ventricular ejection fraction, by estimation, is 25 to 30%. The  left ventricle has severely decreased function. The left ventricle  demonstrates global hypokinesis. Left ventricular diastolic parameters are  consistent with Grade II diastolic  dysfunction (pseudonormalization).   2. Right ventricular systolic function is moderately reduced. The right  ventricular size is normal. There is mildly elevated pulmonary artery  systolic pressure.   3. Left atrial size was moderately dilated.   4. Right atrial size was moderately dilated.   5. The mitral valve is normal in structure. Trivial mitral valve  regurgitation. No evidence of mitral stenosis.   6. The aortic valve is tricuspid. There is mild calcification of the  aortic valve. Aortic valve regurgitation is not visualized. Aortic valve  sclerosis/calcification is present, without any evidence of aortic  stenosis.   7. The inferior vena cava is dilated in size with <50% respiratory  variability, suggesting right atrial pressure of 15 mmHg.    Laboratory Data:  High Sensitivity Troponin:   Recent  Labs  Lab 02/11/22 1756 02/13/22 0926 02/13/22 1142  TROPONINIHS 75* 904* 800*     Chemistry Recent Labs  Lab 02/11/22 1756 02/12/22 0445 02/13/22 0206  NA 138 140 138  K 4.4 4.1 3.9  CL 105 105 104  CO2 $Re'25 26 27  'IMi$ GLUCOSE 112* 88 91  BUN $Re'17 15 20  'hZR$ CREATININE 1.71* 1.73* 1.85*  CALCIUM 8.5* 8.4* 8.3*  GFRNONAA 40* 39* 36*  ANIONGAP $RemoveB'8 9 7    'VOxdIvEs$ No results for input(s): "PROT", "ALBUMIN", "AST", "ALT", "ALKPHOS", "BILITOT" in the last 168 hours. Lipids No results for input(s): "CHOL", "TRIG", "HDL", "LABVLDL", "LDLCALC", "CHOLHDL" in the last 168 hours.  Hematology Recent Labs  Lab 02/11/22 1756  WBC 6.5  RBC 4.28  HGB 11.4*  HCT 33.9*  MCV 79.2*  MCH 26.6  MCHC 33.6  RDW 17.7*  PLT 231   Thyroid No results for input(s): "TSH", "FREET4" in the last 168 hours.  BNP Recent Labs  Lab 02/11/22 1756  BNP 3,275.7*    DDimer No results for input(s): "DDIMER" in the last 168 hours.   Radiology/Studies:  ECHOCARDIOGRAM COMPLETE  Result Date: 02/12/2022    ECHOCARDIOGRAM REPORT   Patient Name:   LLEWYN HEAP Date of Exam: 02/12/2022 Medical Rec #:  [id number]         Height:       66.0 in Accession #:    [id number]        Weight:       228.0 lb Date of Birth:  05-05-1940         BSA:          2.114 m Patient Age:    55 years          BP:           142/79 mmHg Patient Gender: M                 HR:           72 bpm. Exam Location:  Inpatient Procedure: 2D Echo, Cardiac Doppler, Color Doppler and Intracardiac            Opacification Agent Indications:    acute diastolic chf  History:        Patient has no prior history of Echocardiogram examinations.                 CHF, chronic kidney disease; Risk Factors:Hypertension,                 Dyslipidemia, Diabetes and Sleep Apnea.  Sonographer:    Johny Chess RDCS Referring Phys: [phone] JARED M GARDNER  Sonographer Comments: Image acquisition challenging due to patient body habitus. IMPRESSIONS  1. Left ventricular ejection  fraction, by estimation, is 25 to 30%. The left ventricle has severely decreased function. The left ventricle demonstrates global hypokinesis. Left ventricular diastolic parameters are consistent with Grade II diastolic dysfunction (pseudonormalization).  2. Right ventricular systolic function is moderately reduced. The right ventricular size is normal. There is mildly elevated pulmonary artery systolic pressure.  3. Left atrial size was moderately dilated.  4. Right atrial size was moderately dilated.  5. The mitral valve is normal in structure. Trivial mitral valve regurgitation. No evidence of mitral stenosis.  6. The aortic valve is tricuspid. There is mild calcification of the aortic valve. Aortic valve regurgitation is not visualized. Aortic valve sclerosis/calcification is present, without any evidence of aortic stenosis.  7. The inferior vena cava is dilated in size with <50% respiratory variability, suggesting right atrial pressure of 15 mmHg. FINDINGS  Left Ventricle: Left ventricular ejection fraction, by estimation,  is 25 to 30%. The left ventricle has severely decreased function. The left ventricle demonstrates global hypokinesis. Definity contrast agent was given IV to delineate the left ventricular endocardial borders. The left ventricular internal cavity size was normal in size. There is no left ventricular hypertrophy. Left ventricular diastolic parameters are consistent with Grade II diastolic dysfunction (pseudonormalization). Right Ventricle: The right ventricular size is normal. No increase in right ventricular wall thickness. Right ventricular systolic function is moderately reduced. There is mildly elevated pulmonary artery systolic pressure. The tricuspid regurgitant velocity is 2.74 m/s, and with an assumed right atrial pressure of 8 mmHg, the estimated right ventricular systolic pressure is 83.1 mmHg. Left Atrium: Left atrial size was moderately dilated. Right Atrium: Right atrial size was  moderately dilated. Pericardium: There is no evidence of pericardial effusion. Mitral Valve: The mitral valve is normal in structure. Trivial mitral valve regurgitation. No evidence of mitral valve stenosis. Tricuspid Valve: The tricuspid valve is normal in structure. Tricuspid valve regurgitation is not demonstrated. No evidence of tricuspid stenosis. Aortic Valve: The aortic valve is tricuspid. There is mild calcification of the aortic valve. Aortic valve regurgitation is not visualized. Aortic valve sclerosis/calcification is present, without any evidence of aortic stenosis. Pulmonic Valve: The pulmonic valve was normal in structure. Pulmonic valve regurgitation is not visualized. No evidence of pulmonic stenosis. Aorta: The aortic root is normal in size and structure. Venous: The inferior vena cava is dilated in size with less than 50% respiratory variability, suggesting right atrial pressure of 15 mmHg. IAS/Shunts: No atrial level shunt detected by color flow Doppler.  LEFT VENTRICLE PLAX 2D LVIDd:         4.90 cm   Diastology LVIDs:         4.20 cm   LV e' medial:    3.48 cm/s LV PW:         0.80 cm   LV E/e' medial:  34.5 LV IVS:        0.90 cm   LV e' lateral:   6.53 cm/s LVOT diam:     2.10 cm   LV E/e' lateral: 18.4 LV SV:         55 LV SV Index:   26 LVOT Area:     3.46 cm  RIGHT VENTRICLE            IVC RV Basal diam:  3.40 cm    IVC diam: 2.20 cm RV S prime:     8.16 cm/s TAPSE (M-mode): 1.1 cm LEFT ATRIUM              Index        RIGHT ATRIUM           Index LA diam:        4.80 cm  2.27 cm/m   RA Area:     19.60 cm LA Vol (A2C):   77.8 ml  36.80 ml/m  RA Volume:   54.00 ml  25.54 ml/m LA Vol (A4C):   101.0 ml 47.77 ml/m LA Biplane Vol: 78.6 ml  37.17 ml/m  AORTIC VALVE LVOT Vmax:   78.30 cm/s LVOT Vmean:  46.800 cm/s LVOT VTI:    0.158 m  AORTA Ao Root diam: 3.00 cm Ao Asc diam:  3.00 cm MITRAL VALVE                TRICUSPID VALVE MV Area (PHT): 4.06 cm     TR Peak grad:   30.0 mmHg MV Decel  Time: 187 msec     TR Vmax:        274.00 cm/s MV E velocity: 120.00 cm/s MV A velocity: 87.40 cm/s   SHUNTS MV E/A ratio:  1.37         Systemic VTI:  0.16 m                             Systemic Diam: 2.10 cm Glori Bickers MD Electronically signed by Glori Bickers MD Signature Date/Time: 02/12/2022/4:55:43 PM    Final    DG Chest 2 View  Result Date: 02/11/2022 CLINICAL DATA:  Shortness of breath. EXAM: CHEST - 2 VIEW COMPARISON:  Chest two views 02/11/2022, earlier today, 03/14/2021 FINDINGS: Mildly decreased lung volumes, similar to 2 prior radiograph 2 hours earlier. The cardiac silhouette is again partially obscured. Moderate atherosclerotic calcifications within the aortic arch. Mild-to-moderate left and mild right pleural effusions are unchanged. Associated left greater than right basilar atelectasis. No pneumothorax. No acute skeletal abnormality. IMPRESSION: Mild-to-moderate left and mild right pleural effusions and associated left greater than right basilar atelectasis are unchanged from radiograph earlier today. Electronically Signed   By: Yvonne Kendall M.Warner.   On: 02/11/2022 18:32     Assessment and Plan:   1. 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 Sallyanne Kuster who had long discussion with patient including about risks/benefits of procedure, we also discussed renal risk of cath -  per these conversations patient agreeable to pursue [facility] on Monday which has been scheduled for 10:30am with Dr. Saunders Revel, orders written - currently on IV Lasix $Remove'40mg'IBrzdZP$  BID, written to continue through Sunday AM then hold in prep for cath - on telmisartan $RemoveBefore'80mg'TTOWKjRzzZFPx$  at home, on $Remove'300mg'RwvfNSt$  irbesartan here as formulary - will hold for now in anticipation for cath given CKD - hold off on beta blocker for now given acute decompensation  - stop amlodipine in anticipation of titration of GDMT as BP  will allow  2. Elevated troponin/possible demand ischemia versus NSTEMI, HLD  - on Plavix PTA for PAD - continue atorvastatin - check LFTs/lipids - per Warner/w MD, add ASA $Remov'81mg'nEqXrT$  daily to Plavix but hold off IV heparin given no chest pain  3. CKD stage IIIb- IV - prior baseline Cr appeared 1.66-1.98 in 2023 so similar this admission - continue to follow closely  4. Essential HTN - manage in context above   5. Diabetes mellitus - per primary team  6. Microcytic anemia - iron panel in AM  Risk Assessment/Risk Scores:     TIMI Risk Score for Unstable Angina or Non-ST Elevation MI:   The patient's TIMI risk score is 3, which indicates a 13% risk of all cause mortality, new or recurrent myocardial infarction or need for urgent revascularization in the next 14 days.  New York Heart Association (NYHA) Functional Class NYHA Class III        For questions or updates, please contact [place] Please consult [email or web address] for contact info under    Signed, Charlie Pitter, PA-C  02/13/2022 2:13 PM   I have seen and examined the patient along with Charlie Pitter, PA-C.  I have reviewed the chart, notes and new data.  I agree with PA's note.  Key new complaints: Feeling much better.  Still requiring O2 and speaking in interrupted sentences.  Usually sleeps on a couch, probably has had orthopnea for at least a month, had lower extremity edema for years. Key examination changes: Mildly elevated JVP, probably 4 cm, otherwise normal cardiovascular exam without lower extremity edema, murmurs or gallops. Key new findings / data: Reviewed the echocardiogram.  Agree that LV systolic function severely depressed with EF of 25-30%, but I think there is clear evidence of regional wall motion abnormalities.  The apex is dyskinetic.  The inferior wall and inferolateral wall are virtually akinetic.  There is relatively better, although not normal wall motion in the anterior wall, septum and lateral  wall.  There is also clear evidence of elevated mean left atrial pressure (pseudonormal mitral inflow).  There are no major valvular abnormalities.   ECG shows deep Q waves in lead III, although not so obvious in lead II and aVF.  He does not have a major conduction abnormality.  Has excellent diabetes control with a hemoglobin A1c that is actually in normal range. Reviewed history of renal dysfunction.  He has been followed over the years in the nephrology clinic, but a discussion regarding renal replacement therapy has not yet been necessary.  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  He has an extensive history of PAD and has clear-cut regional wall motion abnormalities on the echocardiogram suggestive of disease in the RCA, circumflex and distal LAD artery. He will need coronary angiography for precise definition of his coronary anatomy.  This should be done with judicious use of coronary contrast to avoid worsening renal dysfunction.  The presence of Daniel renal abnormalities will also impact the final decision on revascularization strategy.  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 Hold off ACE/ARB/ARNI until after cardiac catheterization.  He still needs diuresis, since he is still hypervolemic, but hold diuresis before his cardiac catheterization. Reviewed the diagnostic cardiac catheterization procedure in detail, and particularly discussing the potential for contrast nephrotoxicity and progression to Daniel kidney failure. Shared Decision Making/Informed Consent The risks [stroke (1 in 1000), death (1 in 1000), kidney failure [usually temporary] (1 in 500), bleeding (1 in 200), allergic reaction [possibly serious] (1 in 200)], benefits (diagnostic support and management  of coronary artery disease) and alternatives of a cardiac catheterization were discussed in detail with Daniel Warner and he is willing to proceed.   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  This activity has great meaning in his life.  He is intent on returning to his social activities.  Sanda Klein, MD, [place] [phone] 02/13/2022, 4:20 PM

## 2022-02-13 NOTE — TOC Initial Note (Signed)
Transition of Care [facility]) - Initial/Assessment Note    Patient Details  Name: Daniel Warner MRN: [id number] Date of Birth: 1940-03-26  Transition of Care [facility]) CM/SW Contact:    Verdell Carmine, RN Phone Number: 02/13/2022, 2:49 PM  Clinical Narrative:                 Patient presented with orthopnea, SHOB. Takes Torsimide a thome, was taking every other day due to frequent night urination. Acute on chrnoic CHF EF by echo 25-35% considering cardiac Cath. On lasix IV BID and foley Lives with wife and is independent in activities. Already has a cardiology FU appt made by CHF navigator.  TOC will follow for needs, recommendations, and transitions of care    Barriers to Discharge: Continued Medical Work up   Patient Goals and CMS Choice            Expected Discharge Plan and Services   Discharge Planning Services: CM Consult   Living arrangements for the past 2 months: [place]                                      Prior Living Arrangements/Services Living arrangements for the past 2 months: Single Family Home Lives with:: Spouse Patient language and need for interpreter reviewed:: Yes        Need for Family Participation in Patient Care: Yes (Comment) Care giver support system in place?: Yes (comment)   Criminal Activity/Legal Involvement Pertinent to Current Situation/Hospitalization: No - Comment as needed  Activities of Daily Living      Permission Sought/Granted                  Emotional Assessment       Orientation: : Oriented to Self, Oriented to Place, Oriented to  Time, Oriented to Situation Alcohol / Substance Use: Not Applicable Psych Involvement: No (comment)  Admission diagnosis:  Shortness of breath [R06.02] CHF (congestive heart failure) (HCC) [I50.9] Elevated brain natriuretic peptide (BNP) level [R79.89] Acute on chronic diastolic CHF (congestive heart failure) (HCC) [I50.33] Patient Active Problem List    Diagnosis Date Noted   Acute combined systolic and diastolic congestive heart failure ([place]) 02/13/2022   CHF (congestive heart failure) ([place]) 02/12/2022   Acute on chronic diastolic CHF (congestive heart failure) ([place]) 02/11/2022   Type 2 diabetes mellitus ([place]) 12/10/2021   Diabetic retinopathy ([place]) 03/07/2021   Hyperparathyroidism due to renal insufficiency ([place]) 03/07/2021   Peripheral venous insufficiency 03/07/2021   Anemia 05/21/2020   Chronic diastolic heart failure ([place]) 05/21/2020   Diabetic nephropathy ([place]) 05/21/2020   Hardening of the aorta (main artery of the heart) ([place]) 05/21/2020   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[place]) 05/21/2020   Pure hypercholesterolemia 05/21/2020   Stricture and stenosis of esophagus 05/21/2020   Diabetes ([place]) 04/08/2018   OSA (obstructive sleep apnea) 04/08/2018   Chronic kidney disease (CKD), stage IV (severe) (HCC) 04/08/2018   GERD (gastroesophageal reflux disease) 04/08/2018   PAD (peripheral artery disease) ([place]) 10/16/2014   Essential hypertension 04/11/2013   Mixed hyperlipidemia 04/11/2013   Occlusion and stenosis of carotid artery without mention of cerebral infarction 04/11/2013   Obesity, unspecified 04/11/2013   Nonspecific abnormal electrocardiogram (ECG) (EKG) 04/11/2013   PCP:  Kathalene Frames, MD Pharmacy:   [facility] - [place], [place] - [address]  [place] [address] Phone: [phone] Fax: [phone]  [facility] [address] [place] [address] Phone: [phone] Fax: [phone]     Social Determinants of Health (SDOH) Social History: SDOH Screenings   Food Insecurity: No Food Insecurity (07/04/2019)  Housing: Low Risk  (07/15/2018)  Transportation Needs: No Transportation Needs (07/15/2018)  Depression (PHQ2-9): Low Risk  (07/04/2019)  Financial Resource Strain: Low Risk  (07/15/2018)   Physical Activity: Inactive (07/15/2018)  Stress: No Stress Concern Present (07/15/2018)  Tobacco Use: Low Risk  (02/13/2022)   SDOH Interventions:     Readmission Risk Interventions     No data to display

## 2022-02-13 NOTE — Progress Notes (Signed)
Heart Failure Navigator Progress Note  Assessed for Heart & Vascular TOC clinic readiness.  Patient EF 25-30%, Has close follow up with Dr. Beau Fanny on 02/19/2022.   Navigator will sign off at this time.   Earnestine Leys, BSN, Clinical cytogeneticist Only

## 2022-02-13 NOTE — Progress Notes (Signed)
PROGRESS NOTE    ARSHAD OBERHOLZER  SPQ:[id number] DOB: 12-23-1940 DOA: 02/11/2022 PCP: Kathalene Frames, MD   Brief Narrative:  HPI: Daniel Warner is a 82 y.o. male with medical history significant of dCHF, CKD 4, DM2, HTN.   Pt in to ED with SOB, progressively worsening for past month.  At UC earlier today with B pleural effusions on CXR.  Sent in to ED for IV diuresis.   Confirms worsening BLE edema over past month.  No CP, abd pain, cough.   Admits that he is prescribed torsemide daily but only takes every other day due to it making him get up at night to urinate.  Assessment & Plan:   Principal Problem:   Acute on chronic diastolic CHF (congestive heart failure) (HCC) Active Problems:   Chronic kidney disease (CKD), stage IV (severe) (HCC)   Essential hypertension   Mixed hyperlipidemia   Type 2 diabetes mellitus (HCC)   CHF (congestive heart failure) (HCC)   Acute combined systolic and diastolic congestive heart failure (HCC)  Acute on chronic diastolic congestive heart failure: Still with shortness of breath but improving every day, much better compared to yesterday.  Net diuresis of 3.5 L since admission.  Renal function intact.  Echo shows 25 to 30% ejection fraction which is new, has known diastolic dysfunction.  I have consulted cardiology, patient might be offered cardiac cath.  Continue current dose of Lasix with a strict I's and O's, daily weight and low-sodium diet.  I will also check cardiac enzymes.  CKD stage IV: Baseline creatinine around 1.7, currently at baseline.  Type 2 diabetes mellitus: On Trulicity at home.  Blood sugar controlled on SSI.  Hyperlipidemia: Continue atorvastatin.  Hypertension: Controlled.  Continue amlodipine and telmisartan.  DVT prophylaxis: enoxaparin (LOVENOX) injection 40 mg Start: 02/12/22 1000   Code Status: Full Code  Family Communication: Wife present at bedside.  Plan of care discussed with patient in length and  he/she verbalized understanding and agreed with it. Status is: Inpatient Remains inpatient appropriate because: Needs cardiac workup.    Estimated body mass index is 33.65 kg/m as calculated from the following:   Height as of this encounter: $RemoveBeforeD'5\' 6"'oJlYkNmeDURgfG$  (1.676 m).   Weight as of this encounter: 94.6 kg.    Nutritional Assessment: Body mass index is 33.65 kg/m.Marland Kitchen Seen by dietician.  I agree with the assessment and plan as outlined below: Nutrition Status:        . Skin Assessment: I have examined the patient's skin and I agree with the wound assessment as performed by the wound care RN as outlined below:    Consultants:  None  Procedures:  None  Antimicrobials:  Anti-infectives (From admission, onward)    None         Subjective: Seen and examined.  Wife at the bedside.  Patient states that his breathing is much better than yesterday.  He has no other complaint.  Objective: Vitals:   02/12/22 2302 02/13/22 0300 02/13/22 0625 02/13/22 0833  BP: 132/65 (!) 131/96  (!) 149/73  Pulse: 68 74  91  Resp: $Remo'14 13  17  'IdVrZ$ Temp: 97.7 F (36.5 C) 97.8 F (36.6 C)  97.9 F (36.6 C)  TempSrc: Oral Oral  Axillary  SpO2: 96% 99%  96%  Weight:   94.6 kg   Height:        Intake/Output Summary (Last 24 hours) at 02/13/2022 1004 Last data filed at 02/13/2022 0838 Gross per 24 hour  Intake  50 ml  Output 2950 ml  Net -2900 ml    Filed Weights   02/11/22 1738 02/12/22 1300 02/13/22 0625  Weight: 103.4 kg 98.9 kg 94.6 kg    Examination:  General exam: Appears calm and comfortable  Respiratory system: Clear to auscultation. Respiratory effort normal. Cardiovascular system: S1 & S2 heard, RRR. No JVD, murmurs, rubs, gallops or clicks.  +3 pitting edema bilateral lower extremity. Gastrointestinal system: Abdomen is nondistended, soft and nontender. No organomegaly or masses felt. Normal bowel sounds heard. Central nervous system: Alert and oriented. No focal neurological  deficits. Extremities: Symmetric 5 x 5 power. Skin: No rashes, lesions or ulcers.  Psychiatry: Judgement and insight appear normal. Mood & affect appropriate.    Data Reviewed: I have personally reviewed following labs and imaging studies  CBC: Recent Labs  Lab 02/11/22 1756  WBC 6.5  NEUTROABS 5.0  HGB 11.4*  HCT 33.9*  MCV 79.2*  PLT 374    Basic Metabolic Panel: Recent Labs  Lab 02/11/22 1756 02/12/22 0445 02/13/22 0206  NA 138 140 138  K 4.4 4.1 3.9  CL 105 105 104  CO2 $Re'25 26 27  'tDt$ GLUCOSE 112* 88 91  BUN $Re'17 15 20  'RAH$ CREATININE 1.71* 1.73* 1.85*  CALCIUM 8.5* 8.4* 8.3*    GFR: Estimated Creatinine Clearance: 33.7 mL/min (A) (by C-G formula based on SCr of 1.85 mg/dL (H)). Liver Function Tests: No results for input(s): "AST", "ALT", "ALKPHOS", "BILITOT", "PROT", "ALBUMIN" in the last 168 hours. No results for input(s): "LIPASE", "AMYLASE" in the last 168 hours. No results for input(s): "AMMONIA" in the last 168 hours. Coagulation Profile: No results for input(s): "INR", "PROTIME" in the last 168 hours. Cardiac Enzymes: No results for input(s): "CKTOTAL", "CKMB", "CKMBINDEX", "TROPONINI" in the last 168 hours. BNP (last 3 results) No results for input(s): "PROBNP" in the last 8760 hours. HbA1C: Recent Labs    02/12/22 1156  HGBA1C 5.6   CBG: Recent Labs  Lab 02/11/22 2048 02/12/22 1325 02/12/22 1652 02/12/22 2114 02/13/22 0618  GLUCAP 95 130* 109* 125* 87    Lipid Profile: No results for input(s): "CHOL", "HDL", "LDLCALC", "TRIG", "CHOLHDL", "LDLDIRECT" in the last 72 hours. Thyroid Function Tests: No results for input(s): "TSH", "T4TOTAL", "FREET4", "T3FREE", "THYROIDAB" in the last 72 hours. Anemia Panel: No results for input(s): "VITAMINB12", "FOLATE", "FERRITIN", "TIBC", "IRON", "RETICCTPCT" in the last 72 hours. Sepsis Labs: No results for input(s): "PROCALCITON", "LATICACIDVEN" in the last 168 hours.  No results found for this or any  previous visit (from the past 240 hour(s)).   Radiology Studies: ECHOCARDIOGRAM COMPLETE  Result Date: 02/12/2022    ECHOCARDIOGRAM REPORT   Patient Name:   TYVON EGGENBERGER Date of Exam: 02/12/2022 Medical Rec #:  [id number]         Height:       66.0 in Accession #:    [id number]        Weight:       228.0 lb Date of Birth:  04-01-1940         BSA:          2.114 m Patient Age:    1 years          BP:           142/79 mmHg Patient Gender: M                 HR:           72 bpm. Exam Location:  Inpatient  Procedure: 2D Echo, Cardiac Doppler, Color Doppler and Intracardiac            Opacification Agent Indications:    acute diastolic chf  History:        Patient has no prior history of Echocardiogram examinations.                 CHF, chronic kidney disease; Risk Factors:Hypertension,                 Dyslipidemia, Diabetes and Sleep Apnea.  Sonographer:    Johny Chess RDCS Referring Phys: [phone] JARED M GARDNER  Sonographer Comments: Image acquisition challenging due to patient body habitus. IMPRESSIONS  1. Left ventricular ejection fraction, by estimation, is 25 to 30%. The left ventricle has severely decreased function. The left ventricle demonstrates global hypokinesis. Left ventricular diastolic parameters are consistent with Grade II diastolic dysfunction (pseudonormalization).  2. Right ventricular systolic function is moderately reduced. The right ventricular size is normal. There is mildly elevated pulmonary artery systolic pressure.  3. Left atrial size was moderately dilated.  4. Right atrial size was moderately dilated.  5. The mitral valve is normal in structure. Trivial mitral valve regurgitation. No evidence of mitral stenosis.  6. The aortic valve is tricuspid. There is mild calcification of the aortic valve. Aortic valve regurgitation is not visualized. Aortic valve sclerosis/calcification is present, without any evidence of aortic stenosis.  7. The inferior vena cava is dilated in size with  <50% respiratory variability, suggesting right atrial pressure of 15 mmHg. FINDINGS  Left Ventricle: Left ventricular ejection fraction, by estimation, is 25 to 30%. The left ventricle has severely decreased function. The left ventricle demonstrates global hypokinesis. Definity contrast agent was given IV to delineate the left ventricular endocardial borders. The left ventricular internal cavity size was normal in size. There is no left ventricular hypertrophy. Left ventricular diastolic parameters are consistent with Grade II diastolic dysfunction (pseudonormalization). Right Ventricle: The right ventricular size is normal. No increase in right ventricular wall thickness. Right ventricular systolic function is moderately reduced. There is mildly elevated pulmonary artery systolic pressure. The tricuspid regurgitant velocity is 2.74 m/s, and with an assumed right atrial pressure of 8 mmHg, the estimated right ventricular systolic pressure is 12.7 mmHg. Left Atrium: Left atrial size was moderately dilated. Right Atrium: Right atrial size was moderately dilated. Pericardium: There is no evidence of pericardial effusion. Mitral Valve: The mitral valve is normal in structure. Trivial mitral valve regurgitation. No evidence of mitral valve stenosis. Tricuspid Valve: The tricuspid valve is normal in structure. Tricuspid valve regurgitation is not demonstrated. No evidence of tricuspid stenosis. Aortic Valve: The aortic valve is tricuspid. There is mild calcification of the aortic valve. Aortic valve regurgitation is not visualized. Aortic valve sclerosis/calcification is present, without any evidence of aortic stenosis. Pulmonic Valve: The pulmonic valve was normal in structure. Pulmonic valve regurgitation is not visualized. No evidence of pulmonic stenosis. Aorta: The aortic root is normal in size and structure. Venous: The inferior vena cava is dilated in size with less than 50% respiratory variability, suggesting  right atrial pressure of 15 mmHg. IAS/Shunts: No atrial level shunt detected by color flow Doppler.  LEFT VENTRICLE PLAX 2D LVIDd:         4.90 cm   Diastology LVIDs:         4.20 cm   LV e' medial:    3.48 cm/s LV PW:         0.80 cm   LV  E/e' medial:  34.5 LV IVS:        0.90 cm   LV e' lateral:   6.53 cm/s LVOT diam:     2.10 cm   LV E/e' lateral: 18.4 LV SV:         55 LV SV Index:   26 LVOT Area:     3.46 cm  RIGHT VENTRICLE            IVC RV Basal diam:  3.40 cm    IVC diam: 2.20 cm RV S prime:     8.16 cm/s TAPSE (M-mode): 1.1 cm LEFT ATRIUM              Index        RIGHT ATRIUM           Index LA diam:        4.80 cm  2.27 cm/m   RA Area:     19.60 cm LA Vol (A2C):   77.8 ml  36.80 ml/m  RA Volume:   54.00 ml  25.54 ml/m LA Vol (A4C):   101.0 ml 47.77 ml/m LA Biplane Vol: 78.6 ml  37.17 ml/m  AORTIC VALVE LVOT Vmax:   78.30 cm/s LVOT Vmean:  46.800 cm/s LVOT VTI:    0.158 m  AORTA Ao Root diam: 3.00 cm Ao Asc diam:  3.00 cm MITRAL VALVE                TRICUSPID VALVE MV Area (PHT): 4.06 cm     TR Peak grad:   30.0 mmHg MV Decel Time: 187 msec     TR Vmax:        274.00 cm/s MV E velocity: 120.00 cm/s MV A velocity: 87.40 cm/s   SHUNTS MV E/A ratio:  1.37         Systemic VTI:  0.16 m                             Systemic Diam: 2.10 cm Glori Bickers MD Electronically signed by Glori Bickers MD Signature Date/Time: 02/12/2022/4:55:43 PM    Final    DG Chest 2 View  Result Date: 02/11/2022 CLINICAL DATA:  Shortness of breath. EXAM: CHEST - 2 VIEW COMPARISON:  Chest two views 02/11/2022, earlier today, 03/14/2021 FINDINGS: Mildly decreased lung volumes, similar to 2 prior radiograph 2 hours earlier. The cardiac silhouette is again partially obscured. Moderate atherosclerotic calcifications within the aortic arch. Mild-to-moderate left and mild right pleural effusions are unchanged. Associated left greater than right basilar atelectasis. No pneumothorax. No acute skeletal abnormality. IMPRESSION:  Mild-to-moderate left and mild right pleural effusions and associated left greater than right basilar atelectasis are unchanged from radiograph earlier today. Electronically Signed   By: Yvonne Kendall M.D.   On: 02/11/2022 18:32    Scheduled Meds:  amLODipine  5 mg Oral QHS   atorvastatin  80 mg Oral q morning   clopidogrel  75 mg Oral Q breakfast   enoxaparin (LOVENOX) injection  40 mg Subcutaneous Q24H   furosemide  40 mg Intravenous BID   insulin aspart  0-5 Units Subcutaneous QHS   insulin aspart  0-9 Units Subcutaneous TID WC   irbesartan  300 mg Oral Daily   sodium chloride flush  3 mL Intravenous Q12H   Continuous Infusions:  sodium chloride       LOS: 1 day   Darliss Cheney, MD [facility]  02/13/2022, 10:04 AM   *  Please note that this is a verbal dictation therefore any spelling or grammatical errors are due to the "[place]" system interpretation.  Please page via [place] and do not message via secure chat for urgent patient care matters. Secure chat can be used for non urgent patient care matters.  How to contact the [facility] Attending or Consulting provider [place] or covering provider during after hours [place], for this patient?  Check the care team in [facility] and look for a) attending/consulting TR[facility] provider listed and b) the [facility] team listed. Page or secure chat [phone]. Log into [email or web address] and use 's universal password to access. If you do not have the password, please contact the hospital operator. Locate the [facility] provider you are looking for under [facility] and page to a number that you can be directly reached. If you still have difficulty reaching the provider, please page the [facility] (Director on Call) for the Hospitalists listed on amion for assistance.

## 2022-02-14 DIAGNOSIS — I5043 Acute on chronic combined systolic (congestive) and diastolic (congestive) heart failure: Secondary | ICD-10-CM | POA: Diagnosis not present

## 2022-02-14 DIAGNOSIS — I5033 Acute on chronic diastolic (congestive) heart failure: Secondary | ICD-10-CM | POA: Diagnosis not present

## 2022-02-14 LAB — CBC
HCT: 30.9 % — ABNORMAL LOW (ref 39.0–52.0)
Hemoglobin: 10.5 g/dL — ABNORMAL LOW (ref 13.0–17.0)
MCH: 26.3 pg (ref 26.0–34.0)
MCHC: 34 g/dL (ref 30.0–36.0)
MCV: 77.4 fL — ABNORMAL LOW (ref 80.0–100.0)
Platelets: 233 10*3/uL (ref 150–400)
RBC: 3.99 MIL/uL — ABNORMAL LOW (ref 4.22–5.81)
RDW: 16.5 % — ABNORMAL HIGH (ref 11.5–15.5)
WBC: 6.3 10*3/uL (ref 4.0–10.5)
nRBC: 0 % (ref 0.0–0.2)

## 2022-02-14 LAB — GLUCOSE, CAPILLARY
Glucose-Capillary: 81 mg/dL (ref 70–99)
Glucose-Capillary: 136 mg/dL — ABNORMAL HIGH (ref 70–99)
Glucose-Capillary: 97 mg/dL (ref 70–99)
Glucose-Capillary: 110 mg/dL — ABNORMAL HIGH (ref 70–99)

## 2022-02-14 LAB — IRON AND TIBC
Iron: 29 ug/dL — ABNORMAL LOW (ref 45–182)
Saturation Ratios: 14 % — ABNORMAL LOW (ref 17.9–39.5)
TIBC: 216 ug/dL — ABNORMAL LOW (ref 250–450)
UIBC: 187 ug/dL

## 2022-02-14 LAB — BASIC METABOLIC PANEL
Anion gap: 7 (ref 5–15)
BUN: 22 mg/dL (ref 8–23)
CO2: 31 mmol/L (ref 22–32)
Calcium: 8.3 mg/dL — ABNORMAL LOW (ref 8.9–10.3)
Chloride: 100 mmol/L (ref 98–111)
Creatinine, Ser: 1.99 mg/dL — ABNORMAL HIGH (ref 0.61–1.24)
GFR, Estimated: 33 mL/min — ABNORMAL LOW (ref >60)
Glucose, Bld: 96 mg/dL (ref 70–99)
Potassium: 3.7 mmol/L (ref 3.5–5.1)
Sodium: 138 mmol/L (ref 135–145)

## 2022-02-14 LAB — FERRITIN: Ferritin: 55 ng/mL (ref 24–336)

## 2022-02-14 LAB — TSH: TSH: 1.431 u[IU]/mL (ref 0.350–4.500)

## 2022-02-14 LAB — LIPID PANEL
Cholesterol: 104 mg/dL (ref 0–200)
HDL: 33 mg/dL — ABNORMAL LOW (ref >40)
Total CHOL/HDL Ratio: 3.2 RATIO
Triglycerides: 49 mg/dL (ref <150)
VLDL: 10 mg/dL (ref 0–40)

## 2022-02-14 MED ORDER — POTASSIUM CHLORIDE CRYS ER 20 MEQ PO TBCR
20.0000 meq | EXTENDED_RELEASE_TABLET | Freq: Every day | ORAL | Status: DC
  Administered 2022-02-14 – 2022-02-20 (×7): 20 meq via ORAL
  Filled 2022-02-14 (×8): qty 1

## 2022-02-14 MED ORDER — FUROSEMIDE 10 MG/ML IJ SOLN
40.0000 mg | Freq: Every day | INTRAMUSCULAR | Status: DC
  Administered 2022-02-15: 40 mg via INTRAVENOUS
  Filled 2022-02-14: qty 4

## 2022-02-14 NOTE — Progress Notes (Signed)
PROGRESS NOTE    Daniel Warner  HWT:[id number] DOB: [birth year] DOA: 02/11/2022 PCP: Kathalene Frames, MD   Brief Narrative:  HPI: Daniel Warner is a 82 y.o. male with medical history significant of dCHF, CKD 4, DM2, HTN.   Pt in to ED with SOB, progressively worsening for past month.  At UC earlier today with B pleural effusions on CXR.  Sent in to ED for IV diuresis.   Confirms worsening BLE edema over past month.  No CP, abd pain, cough.   Admits that he is prescribed torsemide daily but only takes every other day due to it making him get up at night to urinate.  Assessment & Plan:   Principal Problem:   Acute on chronic diastolic CHF (congestive heart failure) (HCC) Active Problems:   Chronic kidney disease (CKD), stage IV (severe) (HCC)   Essential hypertension   Mixed hyperlipidemia   Type 2 diabetes mellitus (HCC)   CHF (congestive heart failure) (HCC)   Acute combined systolic and diastolic congestive heart failure (HCC)  Acute on chronic diastolic congestive heart failure: Feels much better, no shortness of breath.  He is on room air.  Cardiology on board, plan for cardiac cath.  Currently on IV diuresis.  Will defer to cardiology for further management.  CKD stage IV: Baseline creatinine around 1.7, creatinine slight elevated at 1.99.  Type 2 diabetes mellitus: On Trulicity at home.  Blood sugar controlled on SSI.  Hyperlipidemia: Continue atorvastatin.  Hypertension: Controlled.  Continue amlodipine and telmisartan.  DVT prophylaxis: enoxaparin (LOVENOX) injection 40 mg Start: 02/12/22 1000   Code Status: Full Code  Family Communication: Wife present at bedside.  Plan of care discussed with patient in length and he/she verbalized understanding and agreed with it. Status is: Inpatient Remains inpatient appropriate because: Needs cardiac workup.    Estimated body mass index is 33.7 kg/m as calculated from the following:   Height as of this  encounter: $RemoveBef'5\' 6"'kMDISCgRnD$  (1.676 m).   Weight as of this encounter: 94.7 kg.    Nutritional Assessment: Body mass index is 33.7 kg/m.Marland Kitchen Seen by dietician.  I agree with the assessment and plan as outlined below: Nutrition Status:        . Skin Assessment: I have examined the patient's skin and I agree with the wound assessment as performed by the wound care RN as outlined below:    Consultants:  None  Procedures:  None  Antimicrobials:  Anti-infectives (From admission, onward)    None         Subjective:  Patient seen and examined.  He has no complaints.  He is happy that he is finally feeling a lot better.  Edema has improved.  Objective: Vitals:   02/13/22 2100 02/13/22 2346 02/14/22 0448 02/14/22 0909  BP: (!) 140/69 (!) 142/73 130/66 (!) 116/57  Pulse: 76 79 71 81  Resp: $Remo'20 19 14 17  'IWEcI$ Temp: 98.1 F (36.7 C) 98.4 F (36.9 C) 98.3 F (36.8 C) 97.9 F (36.6 C)  TempSrc: Oral Oral Oral Oral  SpO2: 99% 96% 97% 95%  Weight:   94.7 kg   Height:        Intake/Output Summary (Last 24 hours) at 02/14/2022 0957 Last data filed at 02/14/2022 0956 Gross per 24 hour  Intake 240 ml  Output 3780 ml  Net -3540 ml    Filed Weights   02/12/22 1300 02/13/22 0625 02/14/22 0448  Weight: 98.9 kg 94.6 kg 94.7 kg    Examination:  General exam: Appears calm and comfortable  Respiratory system: Clear to auscultation. Respiratory effort normal. Cardiovascular system: S1 & S2 heard, RRR. No JVD, murmurs, rubs, gallops or clicks.  +1-2 pitting edema bilateral lower extremity. Gastrointestinal system: Abdomen is nondistended, soft and nontender. No organomegaly or masses felt. Normal bowel sounds heard. Central nervous system: Alert and oriented. No focal neurological deficits. Extremities: Symmetric 5 x 5 power. Skin: No rashes, lesions or ulcers.  Psychiatry: Judgement and insight appear normal. Mood & affect appropriate.   Data Reviewed: I have personally reviewed following  labs and imaging studies  CBC: Recent Labs  Lab 02/11/22 1756 02/14/22 0059  WBC 6.5 6.3  NEUTROABS 5.0  --   HGB 11.4* 10.5*  HCT 33.9* 30.9*  MCV 79.2* 77.4*  PLT 231 128    Basic Metabolic Panel: Recent Labs  Lab 02/11/22 1756 02/12/22 0445 02/13/22 0206 02/14/22 0059  NA 138 140 138 138  K 4.4 4.1 3.9 3.7  CL 105 105 104 100  CO2 $Re'25 26 27 31  'ThP$ GLUCOSE 112* 88 91 96  BUN $Re'17 15 20 22  'TSU$ CREATININE 1.71* 1.73* 1.85* 1.99*  CALCIUM 8.5* 8.4* 8.3* 8.3*    GFR: Estimated Creatinine Clearance: 31.4 mL/min (A) (by C-G formula based on SCr of 1.99 mg/dL (H)). Liver Function Tests: Recent Labs  Lab 02/13/22 0926  AST 21  ALT 13  ALKPHOS 109  BILITOT 1.0  PROT 5.7*  ALBUMIN 2.9*   No results for input(s): "LIPASE", "AMYLASE" in the last 168 hours. No results for input(s): "AMMONIA" in the last 168 hours. Coagulation Profile: No results for input(s): "INR", "PROTIME" in the last 168 hours. Cardiac Enzymes: No results for input(s): "CKTOTAL", "CKMB", "CKMBINDEX", "TROPONINI" in the last 168 hours. BNP (last 3 results) No results for input(s): "PROBNP" in the last 8760 hours. HbA1C: Recent Labs    02/12/22 1156  HGBA1C 5.6    CBG: Recent Labs  Lab 02/13/22 0618 02/13/22 1109 02/13/22 1611 02/13/22 2115 02/14/22 0612  GLUCAP 87 136* 118* 122* 81    Lipid Profile: Recent Labs    02/14/22 0059  CHOL 104  HDL 33*  LDLCALC NOT CALCULATED  TRIG 49  CHOLHDL 3.2   Thyroid Function Tests: Recent Labs    02/14/22 0059  TSH 1.431   Anemia Panel: Recent Labs    02/14/22 0059  FERRITIN 55  TIBC 216*  IRON 29*   Sepsis Labs: No results for input(s): "PROCALCITON", "LATICACIDVEN" in the last 168 hours.  No results found for this or any previous visit (from the past 240 hour(s)).   Radiology Studies: ECHOCARDIOGRAM COMPLETE  Result Date: 02/12/2022    ECHOCARDIOGRAM REPORT   Patient Name:   Daniel Warner Date of Exam: 02/12/2022 Medical Rec  #:  [id number]         Height:       66.0 in Accession #:    [id number]        Weight:       228.0 lb Date of Birth:  Oct 21, 1940         BSA:          2.114 m Patient Age:    67 years          BP:           142/79 mmHg Patient Gender: M                 HR:  72 bpm. Exam Location:  Inpatient Procedure: 2D Echo, Cardiac Doppler, Color Doppler and Intracardiac            Opacification Agent Indications:    acute diastolic chf  History:        Patient has no prior history of Echocardiogram examinations.                 CHF, chronic kidney disease; Risk Factors:Hypertension,                 Dyslipidemia, Diabetes and Sleep Apnea.  Sonographer:    Johny Chess RDCS Referring Phys: [phone] JARED M GARDNER  Sonographer Comments: Image acquisition challenging due to patient body habitus. IMPRESSIONS  1. Left ventricular ejection fraction, by estimation, is 25 to 30%. The left ventricle has severely decreased function. The left ventricle demonstrates global hypokinesis. Left ventricular diastolic parameters are consistent with Grade II diastolic dysfunction (pseudonormalization).  2. Right ventricular systolic function is moderately reduced. The right ventricular size is normal. There is mildly elevated pulmonary artery systolic pressure.  3. Left atrial size was moderately dilated.  4. Right atrial size was moderately dilated.  5. The mitral valve is normal in structure. Trivial mitral valve regurgitation. No evidence of mitral stenosis.  6. The aortic valve is tricuspid. There is mild calcification of the aortic valve. Aortic valve regurgitation is not visualized. Aortic valve sclerosis/calcification is present, without any evidence of aortic stenosis.  7. The inferior vena cava is dilated in size with <50% respiratory variability, suggesting right atrial pressure of 15 mmHg. FINDINGS  Left Ventricle: Left ventricular ejection fraction, by estimation, is 25 to 30%. The left ventricle has severely decreased  function. The left ventricle demonstrates global hypokinesis. Definity contrast agent was given IV to delineate the left ventricular endocardial borders. The left ventricular internal cavity size was normal in size. There is no left ventricular hypertrophy. Left ventricular diastolic parameters are consistent with Grade II diastolic dysfunction (pseudonormalization). Right Ventricle: The right ventricular size is normal. No increase in right ventricular wall thickness. Right ventricular systolic function is moderately reduced. There is mildly elevated pulmonary artery systolic pressure. The tricuspid regurgitant velocity is 2.74 m/s, and with an assumed right atrial pressure of 8 mmHg, the estimated right ventricular systolic pressure is 39.0 mmHg. Left Atrium: Left atrial size was moderately dilated. Right Atrium: Right atrial size was moderately dilated. Pericardium: There is no evidence of pericardial effusion. Mitral Valve: The mitral valve is normal in structure. Trivial mitral valve regurgitation. No evidence of mitral valve stenosis. Tricuspid Valve: The tricuspid valve is normal in structure. Tricuspid valve regurgitation is not demonstrated. No evidence of tricuspid stenosis. Aortic Valve: The aortic valve is tricuspid. There is mild calcification of the aortic valve. Aortic valve regurgitation is not visualized. Aortic valve sclerosis/calcification is present, without any evidence of aortic stenosis. Pulmonic Valve: The pulmonic valve was normal in structure. Pulmonic valve regurgitation is not visualized. No evidence of pulmonic stenosis. Aorta: The aortic root is normal in size and structure. Venous: The inferior vena cava is dilated in size with less than 50% respiratory variability, suggesting right atrial pressure of 15 mmHg. IAS/Shunts: No atrial level shunt detected by color flow Doppler.  LEFT VENTRICLE PLAX 2D LVIDd:         4.90 cm   Diastology LVIDs:         4.20 cm   LV e' medial:    3.48 cm/s  LV PW:  0.80 cm   LV E/e' medial:  34.5 LV IVS:        0.90 cm   LV e' lateral:   6.53 cm/s LVOT diam:     2.10 cm   LV E/e' lateral: 18.4 LV SV:         55 LV SV Index:   26 LVOT Area:     3.46 cm  RIGHT VENTRICLE            IVC RV Basal diam:  3.40 cm    IVC diam: 2.20 cm RV S prime:     8.16 cm/s TAPSE (M-mode): 1.1 cm LEFT ATRIUM              Index        RIGHT ATRIUM           Index LA diam:        4.80 cm  2.27 cm/m   RA Area:     19.60 cm LA Vol (A2C):   77.8 ml  36.80 ml/m  RA Volume:   54.00 ml  25.54 ml/m LA Vol (A4C):   101.0 ml 47.77 ml/m LA Biplane Vol: 78.6 ml  37.17 ml/m  AORTIC VALVE LVOT Vmax:   78.30 cm/s LVOT Vmean:  46.800 cm/s LVOT VTI:    0.158 m  AORTA Ao Root diam: 3.00 cm Ao Asc diam:  3.00 cm MITRAL VALVE                TRICUSPID VALVE MV Area (PHT): 4.06 cm     TR Peak grad:   30.0 mmHg MV Decel Time: 187 msec     TR Vmax:        274.00 cm/s MV E velocity: 120.00 cm/s MV A velocity: 87.40 cm/s   SHUNTS MV E/A ratio:  1.37         Systemic VTI:  0.16 m                             Systemic Diam: 2.10 cm Glori Bickers MD Electronically signed by Glori Bickers MD Signature Date/Time: 02/12/2022/4:55:43 PM    Final     Scheduled Meds:  aspirin EC  81 mg Oral Daily   atorvastatin  80 mg Oral q morning   clopidogrel  75 mg Oral Q breakfast   enoxaparin (LOVENOX) injection  40 mg Subcutaneous Q24H   furosemide  40 mg Intravenous BID   insulin aspart  0-5 Units Subcutaneous QHS   insulin aspart  0-9 Units Subcutaneous TID WC   sodium chloride flush  3 mL Intravenous Q12H   sodium chloride flush  3 mL Intravenous Q12H   Continuous Infusions:  sodium chloride       LOS: 2 days   Darliss Cheney, MD [facility]  02/14/2022, 9:57 AM   *Please note that this is a verbal dictation therefore any spelling or grammatical errors are due to the "[employer]" system interpretation.  Please page via [employer] and do not message via secure chat for urgent patient  care matters. Secure chat can be used for non urgent patient care matters.  How to contact the [facility] Attending or Consulting provider [place] or covering provider during after hours [place], for this patient?  Check the care team in [facility] and look for a) attending/consulting [facility] provider listed and b) the [facility] team listed. Page or secure chat [phone]. Log into [email or web address] and use  [place] universal password to access. If you do not have the password, please contact the hospital operator. Locate the [facility] provider you are looking for under [facility] and page to a number that you can be directly reached. If you still have difficulty reaching the provider, please page the [facility] (Director on Call) for the Hospitalists listed on amion for assistance.

## 2022-02-14 NOTE — Progress Notes (Signed)
Rounding Note    Patient Name: Daniel Warner Date of Encounter: 02/14/2022  [place] Cardiologist: Sanda Klein, MD    Subjective   Mr. Rollyson is a 82 year old gentleman with a history of diabetes mellitus, hyperlipidemia, hypertension, peripheral arterial disease, CKD.  We are asked to see him for further evaluation of congestive heart failure.  Patient has had chronic leg edema.  Echocardiogram from February 12, 2022 reveals severe LV dysfunction with EF of 25 to 30%.  He has grade 2 diastolic dysfunction.  He has moderately reduced RV function.  Pulmonary artery pressures are mildly elevated.  No old echo in the epic system to compare  He is scheduled for R and Left heart cath on Monday   Creatinine was 1.71 on 02/11/22 Creatinine is 1.99 as of 02/14/22  He is on lasix 40 IV BID  I/O are -7.2 liters so far this admission I Will reduce IV Lasix to 40 mg QD      Inpatient Medications    Scheduled Meds:  aspirin EC  81 mg Oral Daily   atorvastatin  80 mg Oral q morning   clopidogrel  75 mg Oral Q breakfast   enoxaparin (LOVENOX) injection  40 mg Subcutaneous Q24H   furosemide  40 mg Intravenous BID   insulin aspart  0-5 Units Subcutaneous QHS   insulin aspart  0-9 Units Subcutaneous TID WC   sodium chloride flush  3 mL Intravenous Q12H   sodium chloride flush  3 mL Intravenous Q12H   Continuous Infusions:  sodium chloride     PRN Meds: sodium chloride, acetaminophen, ondansetron (ZOFRAN) IV, sodium chloride flush   Vital Signs    Vitals:   02/13/22 2100 02/13/22 2346 02/14/22 0448 02/14/22 0909  BP: (!) 140/69 (!) 142/73 130/66 (!) 116/57  Pulse: 76 79 71 81  Resp: $Remo'20 19 14 17  'PpSfU$ Temp: 98.1 F (36.7 C) 98.4 F (36.9 C) 98.3 F (36.8 C) 97.9 F (36.6 C)  TempSrc: Oral Oral Oral Oral  SpO2: 99% 96% 97% 95%  Weight:   94.7 kg   Height:        Intake/Output Summary (Last 24 hours) at 02/14/2022 0959 Last data filed at 02/14/2022 0956 Gross  per 24 hour  Intake 240 ml  Output 3780 ml  Net -3540 ml      02/14/2022    4:48 AM 02/13/2022    6:25 AM 02/12/2022    1:00 PM  Last 3 Weights  Weight (lbs) 208 lb 12.4 oz 208 lb 8 oz 218 lb 0.6 oz  Weight (kg) 94.7 kg 94.575 kg 98.9 kg      Telemetry    NSR  - Personally Reviewed  ECG     - Personally Reviewed  Physical Exam   GEN: No acute distress.   Neck: No JVD Cardiac: RRR, no murmurs, rubs, or gallops.  Respiratory: consolidation / reduced breath sounds in both bases.   GI: Soft, nontender, non-distended  MS: trace - 1 + edema in legs,  less than what was described in previous notes.  No deformity. Neuro:  Nonfocal  Psych: Normal affect   Labs    High Sensitivity Troponin:   Recent Labs  Lab 02/11/22 1756 02/13/22 0926 02/13/22 1142  TROPONINIHS 75* 904* 800*     Chemistry Recent Labs  Lab 02/12/22 0445 02/13/22 0206 02/13/22 0926 02/14/22 0059  NA 140 138  --  138  K 4.1 3.9  --  3.7  CL 105  104  --  100  CO2 26 27  --  31  GLUCOSE 88 91  --  96  BUN 15 20  --  22  CREATININE 1.73* 1.85*  --  1.99*  CALCIUM 8.4* 8.3*  --  8.3*  PROT  --   --  5.7*  --   ALBUMIN  --   --  2.9*  --   AST  --   --  21  --   ALT  --   --  13  --   ALKPHOS  --   --  109  --   BILITOT  --   --  1.0  --   GFRNONAA 39* 36*  --  33*  ANIONGAP 9 7  --  7    Lipids  Recent Labs  Lab 02/14/22 0059  CHOL 104  TRIG 49  HDL 33*  LDLCALC NOT CALCULATED  CHOLHDL 3.2    Hematology Recent Labs  Lab 02/11/22 1756 02/14/22 0059  WBC 6.5 6.3  RBC 4.28 3.99*  HGB 11.4* 10.5*  HCT 33.9* 30.9*  MCV 79.2* 77.4*  MCH 26.6 26.3  MCHC 33.6 34.0  RDW 17.7* 16.5*  PLT 231 233   Thyroid  Recent Labs  Lab 02/14/22 0059  TSH 1.431    BNP Recent Labs  Lab 02/11/22 1756  BNP 3,275.7*    DDimer No results for input(s): "DDIMER" in the last 168 hours.   Radiology    ECHOCARDIOGRAM COMPLETE  Result Date: 02/12/2022    ECHOCARDIOGRAM REPORT   Patient Name:    GRAVIEL PAYEUR Date of Exam: 02/12/2022 Medical Rec #:  [id number]         Height:       66.0 in Accession #:    [id number]        Weight:       228.0 lb Date of Birth:  02/29/1940         BSA:          2.114 m Patient Age:    52 years          BP:           142/79 mmHg Patient Gender: M                 HR:           72 bpm. Exam Location:  Inpatient Procedure: 2D Echo, Cardiac Doppler, Color Doppler and Intracardiac            Opacification Agent Indications:    acute diastolic chf  History:        Patient has no prior history of Echocardiogram examinations.                 CHF, chronic kidney disease; Risk Factors:Hypertension,                 Dyslipidemia, Diabetes and Sleep Apnea.  Sonographer:    Johny Chess RDCS Referring Phys: [phone] JARED M GARDNER  Sonographer Comments: Image acquisition challenging due to patient body habitus. IMPRESSIONS  1. Left ventricular ejection fraction, by estimation, is 25 to 30%. The left ventricle has severely decreased function. The left ventricle demonstrates global hypokinesis. Left ventricular diastolic parameters are consistent with Grade II diastolic dysfunction (pseudonormalization).  2. Right ventricular systolic function is moderately reduced. The right ventricular size is normal. There is mildly elevated pulmonary artery systolic pressure.  3. Left atrial size was moderately dilated.  4. Right atrial  size was moderately dilated.  5. The mitral valve is normal in structure. Trivial mitral valve regurgitation. No evidence of mitral stenosis.  6. The aortic valve is tricuspid. There is mild calcification of the aortic valve. Aortic valve regurgitation is not visualized. Aortic valve sclerosis/calcification is present, without any evidence of aortic stenosis.  7. The inferior vena cava is dilated in size with <50% respiratory variability, suggesting right atrial pressure of 15 mmHg. FINDINGS  Left Ventricle: Left ventricular ejection fraction, by estimation, is 25 to  30%. The left ventricle has severely decreased function. The left ventricle demonstrates global hypokinesis. Definity contrast agent was given IV to delineate the left ventricular endocardial borders. The left ventricular internal cavity size was normal in size. There is no left ventricular hypertrophy. Left ventricular diastolic parameters are consistent with Grade II diastolic dysfunction (pseudonormalization). Right Ventricle: The right ventricular size is normal. No increase in right ventricular wall thickness. Right ventricular systolic function is moderately reduced. There is mildly elevated pulmonary artery systolic pressure. The tricuspid regurgitant velocity is 2.74 m/s, and with an assumed right atrial pressure of 8 mmHg, the estimated right ventricular systolic pressure is 96.2 mmHg. Left Atrium: Left atrial size was moderately dilated. Right Atrium: Right atrial size was moderately dilated. Pericardium: There is no evidence of pericardial effusion. Mitral Valve: The mitral valve is normal in structure. Trivial mitral valve regurgitation. No evidence of mitral valve stenosis. Tricuspid Valve: The tricuspid valve is normal in structure. Tricuspid valve regurgitation is not demonstrated. No evidence of tricuspid stenosis. Aortic Valve: The aortic valve is tricuspid. There is mild calcification of the aortic valve. Aortic valve regurgitation is not visualized. Aortic valve sclerosis/calcification is present, without any evidence of aortic stenosis. Pulmonic Valve: The pulmonic valve was normal in structure. Pulmonic valve regurgitation is not visualized. No evidence of pulmonic stenosis. Aorta: The aortic root is normal in size and structure. Venous: The inferior vena cava is dilated in size with less than 50% respiratory variability, suggesting right atrial pressure of 15 mmHg. IAS/Shunts: No atrial level shunt detected by color flow Doppler.  LEFT VENTRICLE PLAX 2D LVIDd:         4.90 cm   Diastology  LVIDs:         4.20 cm   LV e' medial:    3.48 cm/s LV PW:         0.80 cm   LV E/e' medial:  34.5 LV IVS:        0.90 cm   LV e' lateral:   6.53 cm/s LVOT diam:     2.10 cm   LV E/e' lateral: 18.4 LV SV:         55 LV SV Index:   26 LVOT Area:     3.46 cm  RIGHT VENTRICLE            IVC RV Basal diam:  3.40 cm    IVC diam: 2.20 cm RV S prime:     8.16 cm/s TAPSE (M-mode): 1.1 cm LEFT ATRIUM              Index        RIGHT ATRIUM           Index LA diam:        4.80 cm  2.27 cm/m   RA Area:     19.60 cm LA Vol (A2C):   77.8 ml  36.80 ml/m  RA Volume:   54.00 ml  25.54 ml/m LA Vol (A4C):  101.0 ml 47.77 ml/m LA Biplane Vol: 78.6 ml  37.17 ml/m  AORTIC VALVE LVOT Vmax:   78.30 cm/s LVOT Vmean:  46.800 cm/s LVOT VTI:    0.158 m  AORTA Ao Root diam: 3.00 cm Ao Asc diam:  3.00 cm MITRAL VALVE                TRICUSPID VALVE MV Area (PHT): 4.06 cm     TR Peak grad:   30.0 mmHg MV Decel Time: 187 msec     TR Vmax:        274.00 cm/s MV E velocity: 120.00 cm/s MV A velocity: 87.40 cm/s   SHUNTS MV E/A ratio:  1.37         Systemic VTI:  0.16 m                             Systemic Diam: 2.10 cm Glori Bickers MD Electronically signed by Glori Bickers MD Signature Date/Time: 02/12/2022/4:55:43 PM    Final     Cardiac Studies      Patient Profile     82 y.o. male    Assessment & Plan    1.  Acute on chronic combined systolic and diastolic congestive heart failure: Ejection fraction is 25 to 30%.  He is scheduled for right and left heart catheterization on Monday.  His creatinine has increased over the past several days.  He has been diuresing very briskly.  Will back down on the Lasix to 40 mg once a day.  Recheck basic metabolic profile tomorrow and again on Monday prior to his heart cath.  2.  Bilateral pleural effusions: He has blunted breath sounds in his bases bilaterally.  He has been getting diuresed.  Hopefully these effusions will improve.  He may need thoracentesis if they do not  improve.      For questions or updates, please contact [place] Please consult [email or web address] for contact info under        Signed, Mertie Moores, MD  02/14/2022, 9:59 AM

## 2022-02-15 DIAGNOSIS — I5033 Acute on chronic diastolic (congestive) heart failure: Secondary | ICD-10-CM | POA: Diagnosis not present

## 2022-02-15 LAB — BASIC METABOLIC PANEL
Anion gap: 8 (ref 5–15)
BUN: 28 mg/dL — ABNORMAL HIGH (ref 8–23)
CO2: 30 mmol/L (ref 22–32)
Calcium: 8 mg/dL — ABNORMAL LOW (ref 8.9–10.3)
Chloride: 99 mmol/L (ref 98–111)
Creatinine, Ser: 1.95 mg/dL — ABNORMAL HIGH (ref 0.61–1.24)
GFR, Estimated: 34 mL/min — ABNORMAL LOW (ref >60)
Glucose, Bld: 126 mg/dL — ABNORMAL HIGH (ref 70–99)
Potassium: 3.9 mmol/L (ref 3.5–5.1)
Sodium: 137 mmol/L (ref 135–145)

## 2022-02-15 LAB — GLUCOSE, CAPILLARY
Glucose-Capillary: 92 mg/dL (ref 70–99)
Glucose-Capillary: 136 mg/dL — ABNORMAL HIGH (ref 70–99)
Glucose-Capillary: 117 mg/dL — ABNORMAL HIGH (ref 70–99)
Glucose-Capillary: 178 mg/dL — ABNORMAL HIGH (ref 70–99)

## 2022-02-15 MED ORDER — SODIUM CHLORIDE 0.9 % IV SOLN: INTRAVENOUS | Status: DC

## 2022-02-15 MED ORDER — SODIUM CHLORIDE 0.9% FLUSH: 3.0000 mL | INTRAVENOUS | Status: DC | PRN

## 2022-02-15 MED ORDER — SODIUM CHLORIDE 0.9 % IV SOLN: 250.0000 mL | INTRAVENOUS | Status: DC | PRN

## 2022-02-15 MED ORDER — ASPIRIN 81 MG PO CHEW
81.0000 mg | CHEWABLE_TABLET | ORAL | Status: AC
  Administered 2022-02-16: 81 mg via ORAL
  Filled 2022-02-15: qty 1

## 2022-02-15 MED ORDER — ORAL CARE MOUTH RINSE: 15.0000 mL | OROMUCOSAL | Status: DC | PRN

## 2022-02-15 NOTE — H&P (View-Only) (Signed)
   Seen by Dr. Cathie Olden yesterday- lasix decreased to 40 mg IV daily. Creatinine stable to slightly improved today. Will d/c further lasix in prep for cath tomorrow. Keep NPO p MN. Repeat BMET in am.  Pixie Casino, MD, FACC, [place] Director of the [facility] Diplomate of the American Board of Clinical Lipidology Attending Cardiologist  Direct Dial: [phone]  Fax: [phone]  Website:  [email or web address]

## 2022-02-15 NOTE — Progress Notes (Signed)
PROGRESS NOTE    Daniel Warner  MBP:[id number] DOB: [birth year] DOA: 02/11/2022 PCP: Kathalene Frames, MD   Brief Narrative:  HPI: Daniel Warner is a 82 y.o. male with medical history significant of dCHF, CKD 4, DM2, HTN.   Pt in to ED with SOB, progressively worsening for past month.  At UC earlier today with B pleural effusions on CXR.  Sent in to ED for IV diuresis.   Confirms worsening BLE edema over past month.  No CP, abd pain, cough.   Admits that he is prescribed torsemide daily but only takes every other day due to it making him get up at night to urinate.  Assessment & Plan:   Principal Problem:   Acute on chronic diastolic CHF (congestive heart failure) (HCC) Active Problems:   Chronic kidney disease (CKD), stage IV (severe) (HCC)   Essential hypertension   Mixed hyperlipidemia   Type 2 diabetes mellitus (HCC)   CHF (congestive heart failure) (HCC)   Acute on chronic combined systolic and diastolic CHF (congestive heart failure) (HCC)  Acute on chronic diastolic congestive heart failure: Feels much better, no shortness of breath.  He is on room air.  Cardiology on board, plan for cardiac cath.  Currently on IV diuresis 40 mg Lasix daily.  Will defer to cardiology for further management.  CKD stage IV: Baseline creatinine around 1.7,  some improvement in creatinine compared to yesterday.  Type 2 diabetes mellitus: On Trulicity at home.  Blood sugar controlled on SSI.  Hyperlipidemia: Continue atorvastatin.  Hypertension: Controlled.  Continue amlodipine and telmisartan.  DVT prophylaxis: enoxaparin (LOVENOX) injection 40 mg Start: 02/12/22 1000   Code Status: Full Code  Family Communication: Wife present at bedside.  Plan of care discussed with patient in length and he/she verbalized understanding and agreed with it. Status is: Inpatient Remains inpatient appropriate because: Needs cardiac workup.    Estimated body mass index is 32.56 kg/m as  calculated from the following:   Height as of this encounter: $RemoveBeforeD'5\' 6"'IonrYgCbWLZPmh$  (1.676 m).   Weight as of this encounter: 91.5 kg.    Nutritional Assessment: Body mass index is 32.56 kg/m.Marland Kitchen Seen by dietician.  I agree with the assessment and plan as outlined below: Nutrition Status:        . Skin Assessment: I have examined the patient's skin and I agree with the wound assessment as performed by the wound care RN as outlined below:    Consultants:  None  Procedures:  None  Antimicrobials:  Anti-infectives (From admission, onward)    None         Subjective:  Seen and examined.  No complaints.  Objective: Vitals:   02/14/22 0448 02/15/22 0015 02/15/22 0500 02/15/22 0743  BP:  121/60 134/69 (!) 140/72  Pulse:  77 74 76  Resp:  $Remo'17 14 17  'opbgY$ Temp:  98.5 F (36.9 C) (!) 97.5 F (36.4 C) 98.1 F (36.7 C)  TempSrc:  Oral Oral Oral  SpO2:  99% 99% 100%  Weight: 94.7 kg  91.5 kg   Height:        Intake/Output Summary (Last 24 hours) at 02/15/2022 1020 Last data filed at 02/15/2022 0535 Gross per 24 hour  Intake --  Output 2550 ml  Net -2550 ml    Filed Weights   02/13/22 0625 02/14/22 0448 02/15/22 0500  Weight: 94.6 kg 94.7 kg 91.5 kg    Examination:  General exam: Appears calm and comfortable  Respiratory system: Diminished breath sounds  at the bases bilaterally. Respiratory effort normal. Cardiovascular system: S1 & S2 heard, RRR. No JVD, murmurs, rubs, gallops or clicks. No pedal edema. Gastrointestinal system: Abdomen is nondistended, soft and nontender. No organomegaly or masses felt. Normal bowel sounds heard. Central nervous system: Alert and oriented. No focal neurological deficits. Extremities: Symmetric 5 x 5 power. Skin: No rashes, lesions or ulcers.  Psychiatry: Judgement and insight appear normal. Mood & affect appropriate.    Data Reviewed: I have personally reviewed following labs and imaging studies  CBC: Recent Labs  Lab 02/11/22 1756  02/14/22 0059  WBC 6.5 6.3  NEUTROABS 5.0  --   HGB 11.4* 10.5*  HCT 33.9* 30.9*  MCV 79.2* 77.4*  PLT 231 157    Basic Metabolic Panel: Recent Labs  Lab 02/11/22 1756 02/12/22 0445 02/13/22 0206 02/14/22 0059 02/15/22 0106  NA 138 140 138 138 137  K 4.4 4.1 3.9 3.7 3.9  CL 105 105 104 100 99  CO2 $Re'25 26 27 31 30  'UxC$ GLUCOSE 112* 88 91 96 126*  BUN $Re'17 15 20 22 'kWR$ 28*  CREATININE 1.71* 1.73* 1.85* 1.99* 1.95*  CALCIUM 8.5* 8.4* 8.3* 8.3* 8.0*    GFR: Estimated Creatinine Clearance: 31.5 mL/min (A) (by C-G formula based on SCr of 1.95 mg/dL (H)). Liver Function Tests: Recent Labs  Lab 02/13/22 0926  AST 21  ALT 13  ALKPHOS 109  BILITOT 1.0  PROT 5.7*  ALBUMIN 2.9*    No results for input(s): "LIPASE", "AMYLASE" in the last 168 hours. No results for input(s): "AMMONIA" in the last 168 hours. Coagulation Profile: No results for input(s): "INR", "PROTIME" in the last 168 hours. Cardiac Enzymes: No results for input(s): "CKTOTAL", "CKMB", "CKMBINDEX", "TROPONINI" in the last 168 hours. BNP (last 3 results) No results for input(s): "PROBNP" in the last 8760 hours. HbA1C: Recent Labs    02/12/22 1156  HGBA1C 5.6    CBG: Recent Labs  Lab 02/14/22 0612 02/14/22 1203 02/14/22 1636 02/14/22 2122 02/15/22 0612  GLUCAP 81 136* 97 110* 92    Lipid Profile: Recent Labs    02/14/22 0059  CHOL 104  HDL 33*  LDLCALC NOT CALCULATED  TRIG 49  CHOLHDL 3.2    Thyroid Function Tests: Recent Labs    02/14/22 0059  TSH 1.431    Anemia Panel: Recent Labs    02/14/22 0059  FERRITIN 55  TIBC 216*  IRON 29*    Sepsis Labs: No results for input(s): "PROCALCITON", "LATICACIDVEN" in the last 168 hours.  No results found for this or any previous visit (from the past 240 hour(s)).   Radiology Studies: No results found.  Scheduled Meds:  aspirin EC  81 mg Oral Daily   atorvastatin  80 mg Oral q morning   clopidogrel  75 mg Oral Q breakfast   enoxaparin  (LOVENOX) injection  40 mg Subcutaneous Q24H   furosemide  40 mg Intravenous Daily   insulin aspart  0-5 Units Subcutaneous QHS   insulin aspart  0-9 Units Subcutaneous TID WC   potassium chloride  20 mEq Oral Daily   sodium chloride flush  3 mL Intravenous Q12H   sodium chloride flush  3 mL Intravenous Q12H   Continuous Infusions:  sodium chloride       LOS: 3 days   Darliss Cheney, MD [facility]  02/15/2022, 10:20 AM   *Please note that this is a verbal dictation therefore any spelling or grammatical errors are due to the "[place] [employer]" system  interpretation.  Please page via [place] and do not message via secure chat for urgent patient care matters. Secure chat can be used for non urgent patient care matters.  How to contact the [facility] Attending or Consulting provider [place] or covering provider during after hours [place], for this patient?  Check the care team in [facility] and look for a) attending/consulting TRH provider listed and b) the [facility] team listed. Page or secure chat [phone]. Log into [email or web address] and use [place]'s universal password to access. If you do not have the password, please contact the hospital operator. Locate the [facility] provider you are looking for under [facility] and page to a number that you can be directly reached. If you still have difficulty reaching the provider, please page the [facility] (Director on Call) for the Hospitalists listed on amion for assistance.

## 2022-02-15 NOTE — Progress Notes (Signed)
   Seen by Dr. Cathie Olden yesterday- lasix decreased to 40 mg IV daily. Creatinine stable to slightly improved today. Will d/c further lasix in prep for cath tomorrow. Keep NPO p MN. Repeat BMET in am.  Pixie Casino, MD, FACC, [place] Director of the Advanced Lipid Disorders &  Cardiovascular Risk Reduction Clinic Diplomate of the American Board of Clinical Lipidology Attending Cardiologist  Direct Dial: [phone]  Fax: [phone]  Website:  [email or web address]

## 2022-02-16 ENCOUNTER — Inpatient Hospital Stay (HOSPITAL_COMMUNITY): Payer: PPO

## 2022-02-16 ENCOUNTER — Encounter (HOSPITAL_COMMUNITY)
Admission: EM | Disposition: A | Discharge status: Home / Self Care | Payer: Self-pay | Source: Home / Self Care | Attending: Family Medicine

## 2022-02-16 ENCOUNTER — Encounter (HOSPITAL_COMMUNITY): Payer: Self-pay | Admitting: Cardiovascular Disease

## 2022-02-16 ENCOUNTER — Other Ambulatory Visit (HOSPITAL_COMMUNITY): Payer: Self-pay

## 2022-02-16 ENCOUNTER — Ambulatory Visit (HOSPITAL_COMMUNITY): Admit: 2022-02-16 | Payer: PPO | Admitting: Cardiovascular Disease

## 2022-02-16 DIAGNOSIS — I2583 Coronary atherosclerosis due to lipid rich plaque: Secondary | ICD-10-CM

## 2022-02-16 DIAGNOSIS — E7800 Pure hypercholesterolemia, unspecified: Secondary | ICD-10-CM

## 2022-02-16 DIAGNOSIS — I2510 Atherosclerotic heart disease of native coronary artery without angina pectoris: Secondary | ICD-10-CM

## 2022-02-16 DIAGNOSIS — J90 Pleural effusion, not elsewhere classified: Secondary | ICD-10-CM | POA: Diagnosis not present

## 2022-02-16 DIAGNOSIS — I5033 Acute on chronic diastolic (congestive) heart failure: Secondary | ICD-10-CM | POA: Diagnosis not present

## 2022-02-16 DIAGNOSIS — I5043 Acute on chronic combined systolic (congestive) and diastolic (congestive) heart failure: Secondary | ICD-10-CM | POA: Diagnosis not present

## 2022-02-16 HISTORY — PX: RIGHT/LEFT HEART CATH AND CORONARY ANGIOGRAPHY: CATH118266

## 2022-02-16 LAB — BASIC METABOLIC PANEL
Anion gap: 12 (ref 5–15)
BUN: 31 mg/dL — ABNORMAL HIGH (ref 8–23)
CO2: 29 mmol/L (ref 22–32)
Calcium: 8.1 mg/dL — ABNORMAL LOW (ref 8.9–10.3)
Chloride: 97 mmol/L — ABNORMAL LOW (ref 98–111)
Creatinine, Ser: 1.84 mg/dL — ABNORMAL HIGH (ref 0.61–1.24)
GFR, Estimated: 36 mL/min — ABNORMAL LOW (ref >60)
Glucose, Bld: 97 mg/dL (ref 70–99)
Potassium: 4 mmol/L (ref 3.5–5.1)
Sodium: 138 mmol/L (ref 135–145)

## 2022-02-16 LAB — GLUCOSE, CAPILLARY
Glucose-Capillary: 99 mg/dL (ref 70–99)
Glucose-Capillary: 170 mg/dL — ABNORMAL HIGH (ref 70–99)
Glucose-Capillary: 90 mg/dL (ref 70–99)
Glucose-Capillary: 101 mg/dL — ABNORMAL HIGH (ref 70–99)

## 2022-02-16 SURGERY — RIGHT/LEFT HEART CATH AND CORONARY ANGIOGRAPHY
Anesthesia: LOCAL

## 2022-02-16 MED ORDER — LABETALOL HCL 5 MG/ML IV SOLN: 10.0000 mg | INTRAVENOUS | Status: AC | PRN

## 2022-02-16 MED ORDER — ATORVASTATIN CALCIUM 80 MG PO TABS: 80.0000 mg | ORAL_TABLET | Freq: Every day | ORAL | Status: DC

## 2022-02-16 MED ORDER — LIDOCAINE HCL (PF) 1 % IJ SOLN
INTRAMUSCULAR | Status: AC
  Filled 2022-02-16: qty 30

## 2022-02-16 MED ORDER — HEPARIN (PORCINE) IN NACL 1000-0.9 UT/500ML-% IV SOLN
INTRAVENOUS | Status: AC
  Filled 2022-02-16: qty 1000

## 2022-02-16 MED ORDER — FENTANYL CITRATE (PF) 100 MCG/2ML IJ SOLN
INTRAMUSCULAR | Status: DC | PRN
  Administered 2022-02-16: 25 ug via INTRAVENOUS

## 2022-02-16 MED ORDER — MIDAZOLAM HCL 2 MG/2ML IJ SOLN
INTRAMUSCULAR | Status: AC
  Filled 2022-02-16: qty 2

## 2022-02-16 MED ORDER — HYDRALAZINE HCL 20 MG/ML IJ SOLN: 10.0000 mg | INTRAMUSCULAR | Status: AC | PRN

## 2022-02-16 MED ORDER — ONDANSETRON HCL 4 MG/2ML IJ SOLN: 4.0000 mg | Freq: Four times a day (QID) | INTRAMUSCULAR | Status: DC | PRN

## 2022-02-16 MED ORDER — SODIUM CHLORIDE 0.9 % IV SOLN: INTRAVENOUS | Status: AC

## 2022-02-16 MED ORDER — SODIUM CHLORIDE 0.9% FLUSH
3.0000 mL | Freq: Two times a day (BID) | INTRAVENOUS | Status: DC
  Administered 2022-02-16 – 2022-02-22 (×12): 3 mL via INTRAVENOUS

## 2022-02-16 MED ORDER — VERAPAMIL HCL 2.5 MG/ML IV SOLN
INTRAVENOUS | Status: AC
  Filled 2022-02-16: qty 2

## 2022-02-16 MED ORDER — IOHEXOL 350 MG/ML SOLN
INTRAVENOUS | Status: DC | PRN
  Administered 2022-02-16: 80 mL via INTRA_ARTERIAL

## 2022-02-16 MED ORDER — SODIUM CHLORIDE 0.9% FLUSH: 3.0000 mL | INTRAVENOUS | Status: DC | PRN

## 2022-02-16 MED ORDER — HEPARIN SODIUM (PORCINE) 1000 UNIT/ML IJ SOLN
INTRAMUSCULAR | Status: AC
  Filled 2022-02-16: qty 10

## 2022-02-16 MED ORDER — HYDRALAZINE HCL 10 MG PO TABS
10.0000 mg | ORAL_TABLET | Freq: Three times a day (TID) | ORAL | Status: DC
  Administered 2022-02-16 – 2022-02-20 (×11): 10 mg via ORAL
  Filled 2022-02-16 (×12): qty 1

## 2022-02-16 MED ORDER — SODIUM CHLORIDE 0.9 % IV SOLN: 250.0000 mL | INTRAVENOUS | Status: DC | PRN

## 2022-02-16 MED ORDER — MORPHINE SULFATE (PF) 2 MG/ML IV SOLN: 2.0000 mg | INTRAVENOUS | Status: DC | PRN

## 2022-02-16 MED ORDER — VERAPAMIL HCL 2.5 MG/ML IV SOLN
INTRA_ARTERIAL | Status: DC | PRN
  Administered 2022-02-16: 15 mL via INTRA_ARTERIAL

## 2022-02-16 MED ORDER — ASPIRIN 81 MG PO CHEW: 81.0000 mg | CHEWABLE_TABLET | Freq: Every day | ORAL | Status: DC

## 2022-02-16 MED ORDER — FENTANYL CITRATE (PF) 100 MCG/2ML IJ SOLN
INTRAMUSCULAR | Status: AC
  Filled 2022-02-16: qty 2

## 2022-02-16 MED ORDER — NITROGLYCERIN 1 MG/10 ML FOR IR/CATH LAB
INTRA_ARTERIAL | Status: AC
  Filled 2022-02-16: qty 10

## 2022-02-16 MED ORDER — ACETAMINOPHEN 325 MG PO TABS: 650.0000 mg | ORAL_TABLET | ORAL | Status: DC | PRN

## 2022-02-16 MED ORDER — MIDAZOLAM HCL 2 MG/2ML IJ SOLN
INTRAMUSCULAR | Status: DC | PRN
  Administered 2022-02-16: 1 mg via INTRAVENOUS

## 2022-02-16 MED ORDER — HEPARIN (PORCINE) IN NACL 1000-0.9 UT/500ML-% IV SOLN
INTRAVENOUS | Status: DC | PRN
  Administered 2022-02-16 (×2): 500 mL

## 2022-02-16 MED ORDER — LIDOCAINE HCL (PF) 1 % IJ SOLN
INTRAMUSCULAR | Status: DC | PRN
  Administered 2022-02-16 (×2): 2 mL

## 2022-02-16 MED ORDER — HEPARIN SODIUM (PORCINE) 1000 UNIT/ML IJ SOLN
INTRAMUSCULAR | Status: DC | PRN
  Administered 2022-02-16: 4500 [IU] via INTRAVENOUS

## 2022-02-16 SURGICAL SUPPLY — 14 items
CATH 5FR JL3.5 JR4 ANG PIG MP (CATHETERS) IMPLANT
CATH BALLN WEDGE 5F 110CM (CATHETERS) IMPLANT
CATH INFINITI 5 FR 3DRC (CATHETERS) IMPLANT
DEVICE RAD COMP TR BAND LRG (VASCULAR PRODUCTS) IMPLANT
GLIDESHEATH SLEND A-KIT 6F 22G (SHEATH) IMPLANT
GUIDEWIRE INQWIRE 1.5J.035X260 (WIRE) IMPLANT
INQWIRE 1.5J .035X260CM (WIRE) ×1
KIT HEART LEFT (KITS) ×1 IMPLANT
PACK CARDIAC CATHETERIZATION (CUSTOM PROCEDURE TRAY) ×1 IMPLANT
SHEATH GLIDE SLENDER 4/5FR (SHEATH) IMPLANT
TRANSDUCER W/STOPCOCK (MISCELLANEOUS) ×1 IMPLANT
TUBING CIL FLEX 10 FLL-RA (TUBING) ×1 IMPLANT
WIRE EMERALD 3MM-J .025X260CM (WIRE) IMPLANT
WIRE HI TORQ VERSACORE-J 145CM (WIRE) IMPLANT

## 2022-02-16 NOTE — Progress Notes (Signed)
Rounding Note    Patient Name: Daniel Warner Date of Encounter: 02/16/2022  [place] Cardiologist: Sanda Klein, MD    Subjective   Daniel Warner is a 82 year old gentleman with a history of diabetes mellitus, hyperlipidemia, hypertension, peripheral arterial disease, CKD.  We are asked to see him for further evaluation of congestive heart failure.  Patient has had chronic leg edema.  Echocardiogram from February 12, 2022 reveals severe LV dysfunction with EF of 25 to 30%.  He has grade 2 diastolic dysfunction.  He has moderately reduced RV function.  Pulmonary artery pressures are mildly elevated.  No old echo in the epic system to compare   Cath today demonstrated 90% ostial to proximal LAD and 99% ostial LCx and otherwise normal coronary arteries.  RHC showed PAP 62/62mmHg with mean 7mmHg, mRAP 54mmHg, PCWP 106mmHg and LVEDP 10mmHg with CI 3.9.  Given his advanced age and severe LV dysfunction secondary to severe ischemic CM and subtotally occluded dominant LCx and high grade ostial/prox LAD medical therapy was recommended.    Inpatient Medications    Scheduled Meds:  aspirin  81 mg Oral Daily   aspirin EC  81 mg Oral Daily   atorvastatin  80 mg Oral q morning   clopidogrel  75 mg Oral Q breakfast   enoxaparin (LOVENOX) injection  40 mg Subcutaneous Q24H   insulin aspart  0-5 Units Subcutaneous QHS   insulin aspart  0-9 Units Subcutaneous TID WC   potassium chloride  20 mEq Oral Daily   sodium chloride flush  3 mL Intravenous Q12H   sodium chloride flush  3 mL Intravenous Q12H   sodium chloride flush  3 mL Intravenous Q12H   Continuous Infusions:  sodium chloride     sodium chloride     sodium chloride     PRN Meds: sodium chloride, sodium chloride, acetaminophen, hydrALAZINE, labetalol, morphine injection, ondansetron (ZOFRAN) IV, mouth rinse, sodium chloride flush, sodium chloride flush   Vital Signs    Vitals:   02/16/22 0836 02/16/22 0841 02/16/22  0846 02/16/22 0851  BP: 132/74 137/67 135/69   Pulse: 75 77 75 (!) 0  Resp: $Remo'16 15 14   'gVOdQ$ Temp:      TempSrc:      SpO2: 94% 96% 97% 93%  Weight:      Height:        Intake/Output Summary (Last 24 hours) at 02/16/2022 0926 Last data filed at 02/16/2022 0553 Gross per 24 hour  Intake 1.66 ml  Output 2050 ml  Net -2048.34 ml       02/16/2022    5:05 AM 02/15/2022    5:00 AM 02/14/2022    4:48 AM  Last 3 Weights  Weight (lbs) 201 lb 4.5 oz 201 lb 11.5 oz 208 lb 12.4 oz  Weight (kg) 91.3 kg 91.5 kg 94.7 kg      Telemetry    NSR  - Personally Reviewed  ECG     No new EKG to review- Personally Reviewed  Physical Exam   GEN: Well nourished, well developed in no acute distress HEENT: Normal NECK: No JVD; No carotid bruits LYMPHATICS: No lymphadenopathy CARDIAC:RRR, no murmurs, rubs, gallops RESPIRATORY:  Clear to auscultation without rales, wheezing or rhonchi  ABDOMEN: Soft, non-tender, non-distended MUSCULOSKELETAL:  1+ B/L LE edema; No deformity  SKIN: Warm and dry NEUROLOGIC:  Alert and oriented x 3 PSYCHIATRIC:  Normal affect  Labs    High Sensitivity Troponin:   Recent Labs  Lab  02/11/22 1756 02/13/22 0926 02/13/22 1142  TROPONINIHS 75* 904* 800*      Chemistry Recent Labs  Lab 02/13/22 0926 02/14/22 0059 02/15/22 0106 02/16/22 0104  NA  --  138 137 138  K  --  3.7 3.9 4.0  CL  --  100 99 97*  CO2  --  $R'31 30 29  'Tb$ GLUCOSE  --  96 126* 97  BUN  --  22 28* 31*  CREATININE  --  1.99* 1.95* 1.84*  CALCIUM  --  8.3* 8.0* 8.1*  PROT 5.7*  --   --   --   ALBUMIN 2.9*  --   --   --   AST 21  --   --   --   ALT 13  --   --   --   ALKPHOS 109  --   --   --   BILITOT 1.0  --   --   --   GFRNONAA  --  33* 34* 36*  ANIONGAP  --  $R'7 8 12     'Pl$ Lipids  Recent Labs  Lab 02/14/22 0059  CHOL 104  TRIG 49  HDL 33*  LDLCALC NOT CALCULATED  CHOLHDL 3.2     Hematology Recent Labs  Lab 02/11/22 1756 02/14/22 0059  WBC 6.5 6.3  RBC 4.28 3.99*  HGB 11.4*  10.5*  HCT 33.9* 30.9*  MCV 79.2* 77.4*  MCH 26.6 26.3  MCHC 33.6 34.0  RDW 17.7* 16.5*  PLT 231 233    Thyroid  Recent Labs  Lab 02/14/22 0059  TSH 1.431     BNP Recent Labs  Lab 02/11/22 1756  BNP 3,275.7*     DDimer No results for input(s): "DDIMER" in the last 168 hours.   Radiology    CARDIAC CATHETERIZATION  Result Date: 02/16/2022 Images from the original result were not included.   Ost Cx to Prox Cx lesion is 99% stenosed.   Ost LAD to Prox LAD lesion is 90% stenosed. Daniel Warner is a 82 y.o. male  [id number] LOCATION:  FACILITY: [facility] PHYSICIAN: Quay Burow, M.D. 07-02-1940 DATE OF PROCEDURE:  02/16/2022 DATE OF DISCHARGE: CARDIAC CATHETERIZATION History obtained from chart review.Daniel Warner is a 82 y.o. male with a hx of DM, HTN, HLD, PAD s/p several prior LE interventions followed by VVS on Plavix, CKD stage IIIb- IV (baseline Cr 1.66-1.98 in 2023 per [facility]) followed at [facility], carotid artery disease (1-39% BICA in 2018) who is being seen 02/13/2022 for the evaluation of CHF at the request of Dr. Doristine Bosworth.  He has been diuresed 11 L.  His troponins were not mildly.  His 2D echo revealed severe LV dysfunction without valvular abnormalities.  He was referred for right left heart cath to define his anatomy and physiology. HEMODYNAMICS: \ 1: Right atrial pressure-9/8, mean of 6 2: Right ventricular pressure-63/2 3: Pulm artery pressure-62/21, mean 37 4: Pulmonary wedge pressure-A-wave 14, V wave 12, mean 12 5: LVEDP-10 6: Cardiac output-7.8 L/min with an index of 3.9 L/min/m    Daniel Warner has severe ischemic cardiomyopathy with a subtotally occluded dominant circumflex and high-grade ostial/proximal LAD.  His initial presentation was of heart failure.  He denies chest pain.  Given his age and LV dysfunction I would recommend continued medical therapy at this time.  The radial sheath and antecubital sheath were removed.  A TR band was placed on the  right wrist to achieve patent hemostasis.  The patient left lab in  stable condition.  Dr. Radford Pax, the patient's attending cardiologist, was notified of these results. Quay Burow. MD, [facility] 02/16/2022 8:56 AM     Cardiac Studies      Patient Profile     82 y.o. male    Assessment & Plan    1.  Acute on chronic combined systolic and diastolic congestive heart failure:  -echo with EF 25 to 30%.   -RHC today with normal right sided pressures and left heart cath with PCW/LVEDP -LHC with severe 2 vessel CAD with subtotalled dominant ostial LCx and high grade Ostial/prox LAD>medical therapy recommended given advanced age and severe LV dysfunction -he volume status appears good by RHC -diuretics on hold for AKI -not a candidate for ACE/ARB/ARNI/MRA/SGLT2i due to AKI -start Hydralazine 10mg  TID and if BP tolerates then add Imdur 15mg  daily tomorrow  2.  Bilateral pleural effusions:  -He has been getting diuresed.  Hopefully these effusions will improve.  -He may need thoracentesis if they do not improve. 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  > 50% of time was spent in direct patient care.        For questions or updates, please contact [place] Please consult [email or web address] for contact info under        Signed, Fransico Him, MD  02/16/2022, 9:26 AM

## 2022-02-16 NOTE — Progress Notes (Signed)
TR Band removed

## 2022-02-16 NOTE — Progress Notes (Signed)
   Heart Failure Stewardship Pharmacist Progress Note   PCP: Kathalene Frames, MD PCP-Cardiologist: Sanda Klein, MD    HPI:  82 yo M with PMH of T2DM, HTN, HLD, PAD, CKD IV, and carotid artery disease.   He presented to the ED on 1/31 with shortness of breath and LE edema. CXR with mild to moderate L and R pleural effusions. ECHO 2/1 showed LVEF 25-30% with global hypokinesis, G2DD, and RV moderately reduced. R/LHC today showed severe ischemic disease with 2 vessel CAD, recommending medical management. RA 6, PA 37, wedge 12, CO 7.8, CI 3.9.  Current HF Medications: Other: hydralazine 10 mg TID  Prior to admission HF Medications: Diuretic: torsemide 20 mg daily (patient reports taking every other day) ACE/ARB/ARNI: telmisartan 80 mg daily  Pertinent Lab Values: Serum creatinine 1.84, BUN 31, Potassium 4.0, Sodium 138, BNP 3275.7, A1c 5.6   Vital Signs: Weight: 201 lbs (admission weight: 228 lbs) Blood pressure: 140/70s  Heart rate: 70s  I/O: -2.7L yesterday; net -11.8L  Medication Assistance / Insurance Benefits Check: Does the patient have prescription insurance?  Yes Type of insurance plan: [employer]  Outpatient Pharmacy:  Prior to admission outpatient pharmacy: [facility] Is the patient willing to use [facility] at discharge? Yes    Assessment: 1. Acute systolic CHF (LVEF 43-88%). NYHA class II symptoms. - Off IV lasix, wedge 12 on cath. Strict I/Os and daily weights. Keep K>4. - Consider starting carvedilol today - Holding irbesartan from AKI. He has h/o cough with ACEI and prior itching and hand swelling with losartan, though was on telimsartan as OP and was tolerating irbesartan here.  - Agree with adding hydralazine 10 mg TID  Plan: 1) Medication changes recommended at this time: - Add carvedilol 3.125 mg BID  2) Patient assistance: - Farxiga/Jardiance copay $0 - Entresto copay $0  3)  Education  - To be completed prior to  discharge  Kerby Nora, PharmD, BCPS Heart Failure Cytogeneticist Phone [phone]

## 2022-02-16 NOTE — Progress Notes (Signed)
PROGRESS NOTE    Daniel Warner  WUJ:[id number] DOB: Feb 20, 1940 DOA: 02/11/2022 PCP: Kathalene Frames, MD   Brief Narrative:  HPI: Daniel Warner is a 82 y.o. male with medical history significant of dCHF, CKD 4, DM2, HTN.   Pt in to ED with SOB, progressively worsening for past month.  At UC earlier today with B pleural effusions on CXR.  Sent in to ED for IV diuresis.   Confirms worsening BLE edema over past month.  No CP, abd pain, cough.   Admits that he is prescribed torsemide daily but only takes every other day due to it making him get up at night to urinate.  Assessment & Plan:   Principal Problem:   Acute on chronic diastolic CHF (congestive heart failure) (HCC) Active Problems:   Chronic kidney disease (CKD), stage IV (severe) (HCC)   Essential hypertension   Mixed hyperlipidemia   Type 2 diabetes mellitus (HCC)   CHF (congestive heart failure) (HCC)   Acute on chronic combined systolic and diastolic CHF (congestive heart failure) (HCC)  Acute on chronic diastolic congestive heart failure: Feels much better, no shortness of breath.  He is on room air.  Cardiology on board. 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   CKD stage IV: Baseline creatinine around 1.7, currently 1.84.  Type 2 diabetes mellitus: On Trulicity at home.  Blood sugar controlled on SSI.  Hyperlipidemia: Continue atorvastatin.  Hypertension: Controlled.  Amlodipine and telmisartan discontinued, he has been started on hydralazine.  DVT prophylaxis: enoxaparin (LOVENOX) injection  40 mg Start: 02/12/22 1000   Code Status: Full Code  Family Communication: Wife present at bedside.  Plan of care discussed with patient in length and he/she verbalized understanding and agreed with it. Status is: Inpatient Remains inpatient appropriate because: Will discharge when cleared by cardiology.    Estimated body mass index is 32.49 kg/m as calculated from the following:   Height as of this encounter: '5\' 6"'$  (1.676 m).   Weight as of this encounter: 91.3 kg.    Nutritional Assessment: Body mass index is 32.49 kg/m.Marland Kitchen Seen by dietician.  I agree with the assessment and plan as outlined below: Nutrition Status:        . Skin Assessment: I have examined the patient's skin and I agree with the wound assessment as performed by the wound care RN as outlined below:    Consultants:  None  Procedures:  None  Antimicrobials:  Anti-infectives (From admission, onward)    None         Subjective:  Seen and examined after cardiac cath.  He was feeling well with no complaints.  Wife at the bedside.  Objective: Vitals:   02/16/22 0841 02/16/22 0846 02/16/22 0851 02/16/22 0930  BP: 137/67 135/69  (!) 133/53  Pulse: 77 75 (!) 0 71  Resp: '15 14  18  '$ Temp:      TempSrc:      SpO2: 96% 97% 93% 97%  Weight:      Height:        Intake/Output Summary (Last 24 hours) at 02/16/2022 0951 Last data  filed at 02/16/2022 0553 Gross per 24 hour  Intake 1.66 ml  Output 2050 ml  Net -2048.34 ml    Filed Weights   02/14/22 0448 02/15/22 0500 02/16/22 0505  Weight: 94.7 kg 91.5 kg 91.3 kg    Examination:  General exam: Appears calm and comfortable  Respiratory system: Diminished breath sounds bilaterally at bases.  Respiratory effort normal. Cardiovascular system: S1 & S2 heard, RRR. No JVD, murmurs, rubs, gallops or clicks. No pedal edema. Gastrointestinal system: Abdomen is nondistended, soft and nontender. No organomegaly or masses felt. Normal bowel sounds  heard. Central nervous system: Alert and oriented. No focal neurological deficits. Extremities: Symmetric 5 x 5 power. Skin: No rashes, lesions or ulcers.  Psychiatry: Judgement and insight appear normal. Mood & affect appropriate.    Data Reviewed: I have personally reviewed following labs and imaging studies  CBC: Recent Labs  Lab 02/11/22 1756 02/14/22 0059  WBC 6.5 6.3  NEUTROABS 5.0  --   HGB 11.4* 10.5*  HCT 33.9* 30.9*  MCV 79.2* 77.4*  PLT 231 470    Basic Metabolic Panel: Recent Labs  Lab 02/12/22 0445 02/13/22 0206 02/14/22 0059 02/15/22 0106 02/16/22 0104  NA 140 138 138 137 138  K 4.1 3.9 3.7 3.9 4.0  CL 105 104 100 99 97*  CO2 $Re'26 27 31 30 29  'Usv$ GLUCOSE 88 91 96 126* 97  BUN $Re'15 20 22 'qgx$ 28* 31*  CREATININE 1.73* 1.85* 1.99* 1.95* 1.84*  CALCIUM 8.4* 8.3* 8.3* 8.0* 8.1*    GFR: Estimated Creatinine Clearance: 33.3 mL/min (A) (by C-G formula based on SCr of 1.84 mg/dL (H)). Liver Function Tests: Recent Labs  Lab 02/13/22 0926  AST 21  ALT 13  ALKPHOS 109  BILITOT 1.0  PROT 5.7*  ALBUMIN 2.9*    No results for input(s): "LIPASE", "AMYLASE" in the last 168 hours. No results for input(s): "AMMONIA" in the last 168 hours. Coagulation Profile: No results for input(s): "INR", "PROTIME" in the last 168 hours. Cardiac Enzymes: No results for input(s): "CKTOTAL", "CKMB", "CKMBINDEX", "TROPONINI" in the last 168 hours. BNP (last 3 results) No results for input(s): "PROBNP" in the last 8760 hours. HbA1C: No results for input(s): "HGBA1C" in the last 72 hours.  CBG: Recent Labs  Lab 02/15/22 0612 02/15/22 1151 02/15/22 1700 02/15/22 2121 02/16/22 0620  GLUCAP 92 136* 117* 178* 99    Lipid Profile: Recent Labs    02/14/22 0059  CHOL 104  HDL 33*  LDLCALC NOT CALCULATED  TRIG 49  CHOLHDL 3.2    Thyroid Function Tests: Recent Labs    02/14/22 0059  TSH 1.431    Anemia Panel: Recent Labs    02/14/22 0059  FERRITIN 55  TIBC 216*   IRON 29*    Sepsis Labs: No results for input(s): "PROCALCITON", "LATICACIDVEN" in the last 168 hours.  No results found for this or any previous visit (from the past 240 hour(s)).   Radiology Studies: CARDIAC CATHETERIZATION  Result Date: 02/16/2022 Images from the original result were not included.   Ost Cx to Prox Cx lesion is 99% stenosed.   Ost LAD to Prox LAD lesion is 90% stenosed. CRISTIN SZATKOWSKI is a 82 y.o. male  [id number] LOCATION:  FACILITY: [facility] PHYSICIAN: Quay Burow, M.D. 22-May-1940 DATE OF PROCEDURE:  02/16/2022 DATE OF DISCHARGE: CARDIAC CATHETERIZATION History obtained from chart review.TYLIQUE AULL is a 82 y.o. male with a hx of DM, HTN, HLD, PAD s/p several prior LE interventions followed by [facility]  on Plavix, CKD stage IIIb- IV (baseline Cr 1.66-1.98 in 2023 per [facility]) followed at [facility], carotid artery disease (1-39% BICA in 2018) who is being seen 02/13/2022 for the evaluation of CHF at the request of Dr. Doristine Bosworth.  He has been diuresed 11 L.  His troponins were not mildly.  His 2D echo revealed severe LV dysfunction without valvular abnormalities.  He was referred for right left heart cath to define his anatomy and physiology. HEMODYNAMICS: \ 1: Right atrial pressure-9/8, mean of 6 2: Right ventricular pressure-63/2 3: Pulm artery pressure-62/21, mean 37 4: Pulmonary wedge pressure-A-wave 14, V wave 12, mean 12 5: LVEDP-10 6: Cardiac output-7.8 L/min with an index of 3.9 L/min/m    Mr. Baca has severe ischemic cardiomyopathy with a subtotally occluded dominant circumflex and high-grade ostial/proximal LAD.  His initial presentation was of heart failure.  He denies chest pain.  Given his age and LV dysfunction I would recommend continued medical therapy at this time.  The radial sheath and antecubital sheath were removed.  A TR band was placed on the right wrist to achieve patent hemostasis.  The patient left lab in stable condition.  Dr. Radford Pax, the  patient's attending cardiologist, was notified of these results. Quay Burow. MD, [facility] 02/16/2022 8:56 AM     Scheduled Meds:  aspirin EC  81 mg Oral Daily   atorvastatin  80 mg Oral q morning   clopidogrel  75 mg Oral Q breakfast   enoxaparin (LOVENOX) injection  40 mg Subcutaneous Q24H   hydrALAZINE  10 mg Oral Q8H   insulin aspart  0-5 Units Subcutaneous QHS   insulin aspart  0-9 Units Subcutaneous TID WC   potassium chloride  20 mEq Oral Daily   sodium chloride flush  3 mL Intravenous Q12H   sodium chloride flush  3 mL Intravenous Q12H   sodium chloride flush  3 mL Intravenous Q12H   Continuous Infusions:  sodium chloride     sodium chloride     sodium chloride       LOS: 4 days   Darliss Cheney, MD [facility]  02/16/2022, 9:51 AM   *Please note that this is a verbal dictation therefore any spelling or grammatical errors are due to the "[place] One" system interpretation.  Please page via [place] and do not message via secure chat for urgent patient care matters. Secure chat can be used for non urgent patient care matters.  How to contact the [facility] Attending or Consulting provider [place] or covering provider during after hours [place], for this patient?  Check the care team in [facility] and look for a) attending/consulting TRH provider listed and b) the [facility] team listed. Page or secure chat 7A-7P. Log into [email or web address] and use [place]'s universal password to access. If you do not have the password, please contact the hospital operator. Locate the [facility] provider you are looking for under [facility] and page to a number that you can be directly reached. If you still have difficulty reaching the provider, please page the [facility] (Director on Call) for the Hospitalists listed on amion for assistance.

## 2022-02-16 NOTE — Interval H&P Note (Signed)
Cath Lab Visit (complete for each Cath Lab visit)  Clinical Evaluation Leading to the Procedure:   ACS: No.  Non-ACS:    Anginal Classification: No Symptoms  Anti-ischemic medical therapy: Minimal Therapy (1 class of medications)  Non-Invasive Test Results: No non-invasive testing performed  Prior CABG: No previous CABG      History and Physical Interval Note:  02/16/2022 7:40 AM  Daniel Warner  has presented today for surgery, with the diagnosis of acute systolic hf.  The various methods of treatment have been discussed with the patient and family. After consideration of risks, benefits and other options for treatment, the patient has consented to  Procedure(s): RIGHT/LEFT HEART CATH AND CORONARY ANGIOGRAPHY (N/A) as a surgical intervention.  The patient's history has been reviewed, patient examined, no change in status, stable for surgery.  I have reviewed the patient's chart and labs.  Questions were answered to the patient's satisfaction.     Quay Burow

## 2022-02-16 NOTE — Care Management Important Message (Signed)
Important Message  Patient Details  Name: JAQUIL TODT MRN: [id number] Date of Birth: 13-Jun-1940   Medicare Important Message Given:  Yes     Shelda Altes 02/16/2022, 10:30 AM

## 2022-02-17 DIAGNOSIS — I5033 Acute on chronic diastolic (congestive) heart failure: Secondary | ICD-10-CM | POA: Diagnosis not present

## 2022-02-17 DIAGNOSIS — I5043 Acute on chronic combined systolic (congestive) and diastolic (congestive) heart failure: Secondary | ICD-10-CM | POA: Diagnosis not present

## 2022-02-17 DIAGNOSIS — J90 Pleural effusion, not elsewhere classified: Secondary | ICD-10-CM | POA: Diagnosis not present

## 2022-02-17 DIAGNOSIS — I2510 Atherosclerotic heart disease of native coronary artery without angina pectoris: Secondary | ICD-10-CM | POA: Diagnosis not present

## 2022-02-17 DIAGNOSIS — E7800 Pure hypercholesterolemia, unspecified: Secondary | ICD-10-CM | POA: Diagnosis not present

## 2022-02-17 LAB — BASIC METABOLIC PANEL
Anion gap: 6 (ref 5–15)
BUN: 28 mg/dL — ABNORMAL HIGH (ref 8–23)
CO2: 29 mmol/L (ref 22–32)
Calcium: 7.9 mg/dL — ABNORMAL LOW (ref 8.9–10.3)
Chloride: 101 mmol/L (ref 98–111)
Creatinine, Ser: 1.8 mg/dL — ABNORMAL HIGH (ref 0.61–1.24)
GFR, Estimated: 37 mL/min — ABNORMAL LOW (ref >60)
Glucose, Bld: 101 mg/dL — ABNORMAL HIGH (ref 70–99)
Potassium: 4.1 mmol/L (ref 3.5–5.1)
Sodium: 136 mmol/L (ref 135–145)

## 2022-02-17 LAB — GLUCOSE, CAPILLARY
Glucose-Capillary: 90 mg/dL (ref 70–99)
Glucose-Capillary: 136 mg/dL — ABNORMAL HIGH (ref 70–99)
Glucose-Capillary: 89 mg/dL (ref 70–99)
Glucose-Capillary: 152 mg/dL — ABNORMAL HIGH (ref 70–99)

## 2022-02-17 LAB — POCT I-STAT EG7
Acid-Base Excess: 7 mmol/L — ABNORMAL HIGH (ref 0.0–2.0)
Acid-Base Excess: 7 mmol/L — ABNORMAL HIGH (ref 0.0–2.0)
Bicarbonate: 33.7 mmol/L — ABNORMAL HIGH (ref 20.0–28.0)
Bicarbonate: 33.9 mmol/L — ABNORMAL HIGH (ref 20.0–28.0)
Calcium, Ion: 1.16 mmol/L (ref 1.15–1.40)
Calcium, Ion: 1.13 mmol/L — ABNORMAL LOW (ref 1.15–1.40)
HCT: 35 % — ABNORMAL LOW (ref 39.0–52.0)
HCT: 35 % — ABNORMAL LOW (ref 39.0–52.0)
Hemoglobin: 11.9 g/dL — ABNORMAL LOW (ref 13.0–17.0)
Hemoglobin: 11.9 g/dL — ABNORMAL LOW (ref 13.0–17.0)
O2 Saturation: 71 %
O2 Saturation: 71 %
Potassium: 4 mmol/L (ref 3.5–5.1)
Potassium: 4 mmol/L (ref 3.5–5.1)
Sodium: 140 mmol/L (ref 135–145)
Sodium: 140 mmol/L (ref 135–145)
TCO2: 35 mmol/L — ABNORMAL HIGH (ref 22–32)
TCO2: 36 mmol/L — ABNORMAL HIGH (ref 22–32)
pCO2, Ven: 58 mmHg (ref 44–60)
pCO2, Ven: 57.8 mmHg (ref 44–60)
pH, Ven: 7.372 (ref 7.25–7.43)
pH, Ven: 7.376 (ref 7.25–7.43)
pO2, Ven: 39 mmHg (ref 32–45)
pO2, Ven: 39 mmHg (ref 32–45)

## 2022-02-17 LAB — CBC
HCT: 31.9 % — ABNORMAL LOW (ref 39.0–52.0)
Hemoglobin: 10.8 g/dL — ABNORMAL LOW (ref 13.0–17.0)
MCH: 26.2 pg (ref 26.0–34.0)
MCHC: 33.9 g/dL (ref 30.0–36.0)
MCV: 77.4 fL — ABNORMAL LOW (ref 80.0–100.0)
Platelets: 234 10*3/uL (ref 150–400)
RBC: 4.12 MIL/uL — ABNORMAL LOW (ref 4.22–5.81)
RDW: 16.3 % — ABNORMAL HIGH (ref 11.5–15.5)
WBC: 7 10*3/uL (ref 4.0–10.5)
nRBC: 0 % (ref 0.0–0.2)

## 2022-02-17 LAB — POCT I-STAT 7, (LYTES, BLD GAS, ICA,H+H)
Acid-Base Excess: 7 mmol/L — ABNORMAL HIGH (ref 0.0–2.0)
Bicarbonate: 33.7 mmol/L — ABNORMAL HIGH (ref 20.0–28.0)
Calcium, Ion: 1.15 mmol/L (ref 1.15–1.40)
HCT: 35 % — ABNORMAL LOW (ref 39.0–52.0)
Hemoglobin: 11.9 g/dL — ABNORMAL LOW (ref 13.0–17.0)
O2 Saturation: 95 %
Potassium: 4 mmol/L (ref 3.5–5.1)
Sodium: 140 mmol/L (ref 135–145)
TCO2: 35 mmol/L — ABNORMAL HIGH (ref 22–32)
pCO2 arterial: 56.9 mmHg — ABNORMAL HIGH (ref 32–48)
pH, Arterial: 7.381 (ref 7.35–7.45)
pO2, Arterial: 80 mmHg — ABNORMAL LOW (ref 83–108)

## 2022-02-17 MED ORDER — CARVEDILOL 3.125 MG PO TABS
3.1250 mg | ORAL_TABLET | Freq: Two times a day (BID) | ORAL | Status: DC
  Administered 2022-02-17 – 2022-02-22 (×12): 3.125 mg via ORAL
  Filled 2022-02-17 (×12): qty 1

## 2022-02-17 NOTE — Plan of Care (Signed)

## 2022-02-17 NOTE — Progress Notes (Signed)
PROGRESS NOTE    Daniel Warner  IWP:[id number] DOB: 1940/01/22 DOA: 02/11/2022 PCP: Kathalene Frames, MD   Brief Narrative:  HPI: Daniel Warner is a 82 y.o. male with medical history significant of dCHF, CKD 4, DM2, HTN.   Pt in to ED with SOB, progressively worsening for past month.  At UC earlier today with B pleural effusions on CXR.  Sent in to ED for IV diuresis.   Confirms worsening BLE edema over past month.  No CP, abd pain, cough.   Admits that he is prescribed torsemide daily but only takes every other day due to it making him get up at night to urinate.  Assessment & Plan:   Principal Problem:   Acute on chronic diastolic CHF (congestive heart failure) (HCC) Active Problems:   Chronic kidney disease (CKD), stage IV (severe) (HCC)   Essential hypertension   Mixed hyperlipidemia   Type 2 diabetes mellitus (HCC)   CHF (congestive heart failure) (HCC)   Acute on chronic combined systolic and diastolic CHF (congestive heart failure) (HCC)  Acute on chronic diastolic congestive heart failure: Feels much better, no shortness of breath.  He is on room air.  Cardiology on board. RHC done 02/16/22 with normal right sided pressures and left heart cath with PCW/LVEDP -LHC with severe 2 vessel CAD with subtotalled dominant ostial LCx and high grade Ostial/prox LAD>medical therapy recommended given advanced age and severe LV dysfunction -his volume status appears good by RHC -diuretics on hold for AKI and creatinine improving. -not a candidate for ACE/ARB/ARNI/MRA/SGLT2i due to AKI -started Hydralazine 10mg  TID yesterday and Coreg 3.125 mg twice daily today.  Plan to start on Imdur if BP tolerates.  Chest x-ray was repeated yesterday, he has mild bilateral pleural effusion.  No indication of thoracentesis.  Incentive spirometry is ordered.  Management per cardiology.  CKD stage IV: Baseline creatinine around 1.7, currently 1.80.  Type 2 diabetes mellitus: On Trulicity at  home.  Blood sugar controlled on SSI.  Hyperlipidemia: Continue atorvastatin.  Hypertension: Controlled.  Amlodipine and telmisartan discontinued, he has been started on medications listed above.  DVT prophylaxis: enoxaparin (LOVENOX) injection 40 mg Start: 02/12/22 1000   Code Status: Full Code  Family Communication: Wife present at bedside.  Plan of care discussed with patient in length and he/she verbalized understanding and agreed with it. Status is: Inpatient Remains inpatient appropriate because: Will discharge when cleared by cardiology.    Estimated body mass index is 31.81 kg/m as calculated from the following:   Height as of this encounter: 5\' 6"  (1.676 m).   Weight as of this encounter: 89.4 kg.    Nutritional Assessment: Body mass index is 31.81 kg/m.Marland Kitchen Seen by dietician.  I agree with the assessment and plan as outlined below: Nutrition Status:        . Skin Assessment: I have examined the patient's skin and I agree with the wound assessment as performed by the wound care RN as outlined below:    Consultants:  None  Procedures:  None  Antimicrobials:  Anti-infectives (From admission, onward)    None         Subjective:  Seen and examined.  He has no complaints.  Denies shortness of breath or chest pain.  Objective: Vitals:   02/17/22 0543 02/17/22 0747 02/17/22 0800 02/17/22 1000  BP: 129/67 136/84 136/86 (!) 101/50  Pulse:  80 82 74  Resp:  18 17 16   Temp:  98.2 F (36.8 C)  TempSrc:  Oral    SpO2:  94% 96%   Weight:      Height:        Intake/Output Summary (Last 24 hours) at 02/17/2022 1033 Last data filed at 02/17/2022 0700 Gross per 24 hour  Intake --  Output 1200 ml  Net -1200 ml    Filed Weights   02/15/22 0500 02/16/22 0505 02/17/22 0500  Weight: 91.5 kg 91.3 kg 89.4 kg    Examination:  General exam: Appears calm and comfortable  Respiratory system: Bibasilar decreased breath sounds. Respiratory effort  normal. Cardiovascular system: S1 & S2 heard, RRR. No JVD, murmurs, rubs, gallops or clicks.  Trace edema bilateral lower extremity. Gastrointestinal system: Abdomen is nondistended, soft and nontender. No organomegaly or masses felt. Normal bowel sounds heard. Central nervous system: Alert and oriented. No focal neurological deficits. Extremities: Symmetric 5 x 5 power. Skin: No rashes, lesions or ulcers.  Psychiatry: Judgement and insight appear normal. Mood & affect appropriate.    Data Reviewed: I have personally reviewed following labs and imaging studies  CBC: Recent Labs  Lab 02/11/22 1756 02/14/22 0059 02/17/22 0127  WBC 6.5 6.3 7.0  NEUTROABS 5.0  --   --   HGB 11.4* 10.5* 10.8*  HCT 33.9* 30.9* 31.9*  MCV 79.2* 77.4* 77.4*  PLT 231 233 379    Basic Metabolic Panel: Recent Labs  Lab 02/13/22 0206 02/14/22 0059 02/15/22 0106 02/16/22 0104 02/17/22 0127  NA 138 138 137 138 136  K 3.9 3.7 3.9 4.0 4.1  CL 104 100 99 97* 101  CO2 $Re'27 31 30 29 29  'DTj$ GLUCOSE 91 96 126* 97 101*  BUN 20 22 28* 31* 28*  CREATININE 1.85* 1.99* 1.95* 1.84* 1.80*  CALCIUM 8.3* 8.3* 8.0* 8.1* 7.9*    GFR: Estimated Creatinine Clearance: 33.7 mL/min (A) (by C-G formula based on SCr of 1.8 mg/dL (H)). Liver Function Tests: Recent Labs  Lab 02/13/22 0926  AST 21  ALT 13  ALKPHOS 109  BILITOT 1.0  PROT 5.7*  ALBUMIN 2.9*    No results for input(s): "LIPASE", "AMYLASE" in the last 168 hours. No results for input(s): "AMMONIA" in the last 168 hours. Coagulation Profile: No results for input(s): "INR", "PROTIME" in the last 168 hours. Cardiac Enzymes: No results for input(s): "CKTOTAL", "CKMB", "CKMBINDEX", "TROPONINI" in the last 168 hours. BNP (last 3 results) No results for input(s): "PROBNP" in the last 8760 hours. HbA1C: No results for input(s): "HGBA1C" in the last 72 hours.  CBG: Recent Labs  Lab 02/16/22 0620 02/16/22 1342 02/16/22 1640 02/16/22 2117 02/17/22 0553   GLUCAP 99 170* 90 101* 90    Lipid Profile: No results for input(s): "CHOL", "HDL", "LDLCALC", "TRIG", "CHOLHDL", "LDLDIRECT" in the last 72 hours.  Thyroid Function Tests: No results for input(s): "TSH", "T4TOTAL", "FREET4", "T3FREE", "THYROIDAB" in the last 72 hours.  Anemia Panel: No results for input(s): "VITAMINB12", "FOLATE", "FERRITIN", "TIBC", "IRON", "RETICCTPCT" in the last 72 hours.  Sepsis Labs: No results for input(s): "PROCALCITON", "LATICACIDVEN" in the last 168 hours.  No results found for this or any previous visit (from the past 240 hour(s)).   Radiology Studies: DG Chest 2 View  Result Date: 02/16/2022 CLINICAL DATA:  Pleural effusion. EXAM: CHEST - 2 VIEW COMPARISON:  February 11, 2022. FINDINGS: Stable cardiomediastinal silhouette. Stable left pleural effusion is noted with associated left basilar atelectasis or infiltrate. Mildly increased right basilar opacity is noted concerning for worsening atelectasis or infiltrate with associated effusion. Bony thorax is  unremarkable. IMPRESSION: Stable left basilar opacity as described above. Increased right basilar opacity is noted concerning for worsening atelectasis or infiltrate with associated effusion. Aortic Atherosclerosis (ICD10-I70.0). Electronically Signed   By: Marijo Conception M.D.   On: 02/16/2022 11:54   DG Chest Bilateral Decubitus  Result Date: 02/16/2022 CLINICAL DATA:  Pleural effusions. EXAM: CHEST - BILATERAL DECUBITUS VIEW COMPARISON:  Chest radiograph the same day 02/16/2022. FINDINGS: Layering bilateral pleural effusions. IMPRESSION: Layering bilateral pleural effusions. Two view chest radiograph dictated separately. Electronically Signed   By: Lorin Picket M.D.   On: 02/16/2022 11:52   CARDIAC CATHETERIZATION  Result Date: 02/16/2022 Images from the original result were not included.   Ost Cx to Prox Cx lesion is 99% stenosed.   Ost LAD to Prox LAD lesion is 90% stenosed. NORLAN RANN is a 82 y.o.  male  [id number] LOCATION:  FACILITY: [facility] PHYSICIAN: Quay Burow, M.D. 11-Nov-1940 DATE OF PROCEDURE:  02/16/2022 DATE OF DISCHARGE: CARDIAC CATHETERIZATION History obtained from chart review.BUD KAESER is a 82 y.o. male with a hx of DM, HTN, HLD, PAD s/p several prior LE interventions followed by [facility] on Plavix, CKD stage IIIb- IV (baseline Cr 1.66-1.98 in 2023 per [facility]) followed at [facility], carotid artery disease (1-39% BICA in 2018) who is being seen 02/13/2022 for the evaluation of CHF at the request of Dr. Doristine Bosworth.  He has been diuresed 11 L.  His troponins were not mildly.  His 2D echo revealed severe LV dysfunction without valvular abnormalities.  He was referred for right left heart cath to define his anatomy and physiology. HEMODYNAMICS: \ 1: Right atrial pressure-9/8, mean of 6 2: Right ventricular pressure-63/2 3: Pulm artery pressure-62/21, mean 37 4: Pulmonary wedge pressure-A-wave 14, V wave 12, mean 12 5: LVEDP-10 6: Cardiac output-7.8 L/min with an index of 3.9 L/min/m    Mr. Holliman has severe ischemic cardiomyopathy with a subtotally occluded dominant circumflex and high-grade ostial/proximal LAD.  His initial presentation was of heart failure.  He denies chest pain.  Given his age and LV dysfunction I would recommend continued medical therapy at this time.  The radial sheath and antecubital sheath were removed.  A TR band was placed on the right wrist to achieve patent hemostasis.  The patient left lab in stable condition.  Dr. Radford Pax, the patient's attending cardiologist, was notified of these results. Quay Burow. MD, [facility] 02/16/2022 8:56 AM     Scheduled Meds:  aspirin EC  81 mg Oral Daily   atorvastatin  80 mg Oral q morning   carvedilol  3.125 mg Oral BID WC   clopidogrel  75 mg Oral Q breakfast   enoxaparin (LOVENOX) injection  40 mg Subcutaneous Q24H   hydrALAZINE  10 mg Oral Q8H   insulin aspart  0-5 Units Subcutaneous QHS   insulin aspart  0-9 Units  Subcutaneous TID WC   potassium chloride  20 mEq Oral Daily   sodium chloride flush  3 mL Intravenous Q12H   Continuous Infusions:     LOS: 5 days   Darliss Cheney, MD [facility]  02/17/2022, 10:33 AM   *Please note that this is a verbal dictation therefore any spelling or grammatical errors are due to the "[place] One" system interpretation.  Please page via [place] and do not message via secure chat for urgent patient care matters. Secure chat can be used for non urgent patient care matters.  How to contact the [facility] Attending or Consulting provider 7A -  7P or covering provider during after hours [place], for this patient?  Check the care team in [facility] and look for a) attending/consulting TRH provider listed and b) the [facility] team listed. Page or secure chat 7A-7P. Log into [email or web address] and use [place]'s universal password to access. If you do not have the password, please contact the hospital operator. Locate the [facility] provider you are looking for under [facility] and page to a number that you can be directly reached. If you still have difficulty reaching the provider, please page the [facility] (Director on Call) for the Hospitalists listed on amion for assistance.

## 2022-02-17 NOTE — Evaluation (Signed)
Occupational Therapy Evaluation Patient Details Name: Daniel Warner MRN: [id number] DOB: 1940/12/04 Today's Date: 02/17/2022   History of Present Illness 82yo male c/o SOB, BLE edema, showed pleural effusions on CXR, sent to ED for diuresis by OP MD due to acute CHF. Received cardiac cath 2/5. PMH CHF, CKD, DM, HTN   Clinical Impression   PT admitted with SOB and acute CHF. Pt currently with functional limitiations due to the deficits listed below (see OT problem list). 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 Pt reports walking with a cane with increased dependence lately. Pt reports edema in his feet making don doff of shoes harder and also inability to sleep due to frequency trips to bathroom due to urination. Pt drinks soley soda and advised to make sure that his intake is caffeine free. During session advocated for water as being the gold standard for fluid intake up to his fluid restricted amount. Pt reports decreased fluid intake trying to not have to get up in the night time to get sleep.  Pt will benefit from skilled OT to increase their independence and safety with adls and balance to allow discharge outpatient OT for energy conservation/ strengthening. Recommendation for rollator to allow a seat for rest breaks as needed.       Recommendations for follow up therapy are one component of a multi-disciplinary discharge planning process, led by the attending physician.  Recommendations may be updated based on patient status, additional functional criteria and insurance authorization.   Follow Up Recommendations  Outpatient OT     Assistance Recommended at Discharge Set up Supervision/Assistance  Patient can return home with the following A little help with walking and/or transfers;A little help with bathing/dressing/bathroom;Assistance with cooking/housework    Functional Status Assessment  Patient has had a recent decline in their  functional status and demonstrates the ability to make significant improvements in function in a reasonable and predictable amount of time.  Equipment Recommendations  BSC/3in1;Other (comment) (rollator due to frequent rest breaks needed- [employer] benefits)    Recommendations for Other Services       Precautions / Restrictions Precautions Precautions: Fall Precaution Comments: x1 fall off front steps Restrictions Weight Bearing Restrictions: No      Mobility Bed Mobility               General bed mobility comments: oob on arrival    Transfers Overall transfer level: Needs assistance Equipment used: Rolling walker (2 wheels) Transfers: Sit to/from Stand Sit to Stand: Min assist           General transfer comment: pt needs (A) to power up from chair surface      Balance           Standing balance support: Reliant on assistive device for balance, Bilateral upper extremity supported, During functional activity Standing balance-Leahy Scale: Fair                             ADL either performed or assessed with clinical judgement   ADL Overall ADL's : Needs assistance/impaired Eating/Feeding: Independent   Grooming: Wash/dry hands   Upper Body Bathing: Minimal assistance   Lower Body Bathing: Moderate assistance   Upper Body Dressing : Minimal assistance   Lower Body Dressing: Moderate assistance Lower Body Dressing Details (indicate cue type and reason): educated on AE. pt has a Secondary school teacher at home already. pt educated on  use for dressing. pt reports trouble with shoe due to edema in feet. pt has a long handled shoe horn per him. pt reports he does best with his hard shoes. Toilet Transfer: Minimal assistance           Functional mobility during ADLs: Min guard;Rolling walker (2 wheels)       Vision Baseline Vision/History: 0 No visual deficits Patient Visual Report: No change from baseline       Perception     Praxis      Pertinent  Vitals/Pain Pain Assessment Pain Assessment: No/denies pain     Hand Dominance Right   Extremity/Trunk Assessment Upper Extremity Assessment Upper Extremity Assessment: Overall WFL for tasks assessed;RUE deficits/detail RUE Deficits / Details: reports concern over not using R UE . pt states " I dependent on my arms now because my legs dont want to go"   Lower Extremity Assessment Lower Extremity Assessment: Generalized weakness   Cervical / Trunk Assessment Cervical / Trunk Assessment: Kyphotic   Communication Communication Communication: No difficulties   Cognition Arousal/Alertness: Awake/alert Behavior During Therapy: WFL for tasks assessed/performed Overall Cognitive Status: Within Functional Limits for tasks assessed                                       General Comments  VSS RA, educated on energy conservation handout and living better with heart failure. pt reports i read it but it helps you went over it. educated on pursed lip breathing for stairs at home    Exercises     Shoulder Instructions      Home Living Family/patient expects to be discharged to:: Private residence Living Arrangements: Spouse/significant other Available Help at Discharge: Available 24 hours/day;Family Type of Home: House Home Access: Stairs to enter [employer] of Steps: 4 Entrance Stairs-Rails: Right;Left Home Layout: One level     Bathroom Shower/Tub: Occupational psychologist: Standard     Home Equipment: Shower seat - built in;Cane - single point;Grab bars - tub/shower   Additional Comments: no animals. 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 drives almost daily. wife does not drive and his sister can (A) with driving if needed. pt is very motivated to return to "[employer] and girls"      Prior Functioning/Environment Prior Level of Function : Independent/Modified Independent             Mobility Comments: MOD I- reports fall  off the 4th step at home due to "slap gave out"          OT Problem List: Decreased strength;Decreased activity tolerance;Impaired balance (sitting and/or standing);Decreased safety awareness;Decreased knowledge of use of DME or AE;Decreased knowledge of precautions;Cardiopulmonary status limiting activity;Increased edema      OT Treatment/Interventions: Self-care/ADL training;Energy conservation;DME and/or AE instruction;Manual therapy;Therapeutic activities;Patient/family education;Balance training;Therapeutic exercise    OT Goals(Current goals can be found in the care plan section) Acute Rehab OT Goals Patient Stated Goal: to return to 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 the 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 OT Goal Formulation: With patient/family Time For Goal Achievement: 03/03/22 Potential to Achieve Goals: Good  OT Frequency: Min 2X/week    Co-evaluation              AM-PAC OT "6 Clicks" Daily Activity     Outcome Measure Help from another person eating meals?: None Help from another person taking care of personal grooming?:  None Help from another person toileting, which includes using toliet, bedpan, or urinal?: A Little Help from another person bathing (including washing, rinsing, drying)?: A Little Help from another person to put on and taking off regular upper body clothing?: None Help from another person to put on and taking off regular lower body clothing?: A Little 6 Click Score: 21   End of Session Equipment Utilized During Treatment: Rolling walker (2 wheels) Nurse Communication: Mobility status;Precautions  Activity Tolerance: Patient tolerated treatment well Patient left: in chair;with call bell/phone within reach;with chair alarm set;with family/visitor present  OT Visit Diagnosis: Unsteadiness on feet (R26.81);Muscle weakness (generalized) (M62.81)                Time: 2194-7125 OT Time Calculation (min): 36 min Charges:  OT General Charges $OT Visit: 1 Visit OT Evaluation $OT Eval  Moderate Complexity: 1 Mod OT Treatments $Self Care/Home Management : 8-22 mins   Brynn, OTR/L  Acute Rehabilitation Services Office: [phone] .   Jeri Modena 02/17/2022, 11:26 AM

## 2022-02-17 NOTE — TOC Progression Note (Signed)
Transition of Care (TOC) - Progression Note  Marvetta Gibbons RN, BSN Transitions of Care Unit 4E- RN Case Manager See Treatment Team for direct phone #   Patient Details  Name: Daniel Warner MRN: [id number] Date of Birth: [birth year]  Transition of Care [facility]) CM/SW Contact  Dahlia Client, Romeo Rabon, RN Phone Number: 02/17/2022, 3:06 PM  Clinical Narrative:    Received msg from OT regarding DME needs-  CM spoke with pt and wife at bedside- per pt he prefers RW and [facility] for home- states he does not want a walker with a seat.  Pt voiced he has been to the [place] in [place] in the past - but presently goes to Dr. Koleen Nimrod locally.  Choice offered for DME coverage and provider- pt voiced he would like to use his [employer] coverage and does not have a preference for provider.   Address, phone # and PCP all confirmed, Family to transport home.   Call made to [facility] w/ [facility] who has contract w/ [employer] for DME- referral accepted for DME needs- and will follow for delivery within 24hr of discharge. CM will notify [employer] when EDD  more clear.      Expected Discharge Plan: Home/Self Care Barriers to Discharge: Continued Medical Work up  Expected Discharge Plan and Services   Discharge Planning Services: CM Consult Post Acute Care Choice: Durable Medical Equipment Living arrangements for the past 2 months: Single Family Home                 DME Arranged: Bedside commode, Walker rolling DME Agency: [facility] Date DME Agency Contacted: 02/17/22 Time DME Agency Contacted: [phone] Representative spoke with at DME Agency: [facility] HH Arranged: NA [place] Agency: NA         Social Determinants of Health ([place]) Interventions SDOH Screenings   Food Insecurity: No Food Insecurity (02/14/2022)  Housing: Low Risk  (02/14/2022)  Transportation Needs: No Transportation Needs (02/14/2022)  Utilities: Not At Risk (02/14/2022)  Depression (PHQ2-9): Low Risk  (07/04/2019)  Financial Resource Strain: Low  Risk  (07/15/2018)  Physical Activity: Inactive (07/15/2018)  Stress: No Stress Concern Present (07/15/2018)  Tobacco Use: Low Risk  (02/16/2022)    Readmission Risk Interventions     No data to display

## 2022-02-17 NOTE — Evaluation (Signed)
Physical Therapy Evaluation Patient Details Name: Daniel Warner MRN: [id number] DOB: [birth year] Today's Date: 02/17/2022  History of Present Illness  82yo male c/o SOB, BLE edema, showed pleural effusions on CXR, sent to ED for diuresis by OP MD due to acute CHF. Received cardiac cath 2/5. PMH CHF, CKD, DM, HTN  Clinical Impression  Received in bed, cooperative with PT. Able to generally mobilize on a Min guard-light MinA level with RW, but perseverated on use of RUE on RW due to cath yesterday, both RN and PT educated him on this and encouraged mobility. VSS on RA. He and his wife report he is generally at his functional baseline s/p ambulation in room. Left up in chair with all needs met, alarm active and wife present, RN aware of pt status. Will continue to see him in house, however anticipate rapid improvement with PT and mobility tech, do not anticipate skilled PT needs at DC.        Recommendations for follow up therapy are one component of a multi-disciplinary discharge planning process, led by the attending physician.  Recommendations may be updated based on patient status, additional functional criteria and insurance authorization.  Follow Up Recommendations No PT follow up      Assistance Recommended at Discharge Intermittent Supervision/Assistance  Patient can return home with the following  A little help with walking and/or transfers;A little help with bathing/dressing/bathroom;Assistance with cooking/housework;Assist for transportation;Help with stairs or ramp for entrance    Equipment Recommendations Rolling walker (2 wheels);BSC/3in1  Recommendations for Other Services       Functional Status Assessment Patient has had a recent decline in their functional status and demonstrates the ability to make significant improvements in function in a reasonable and predictable amount of time.     Precautions / Restrictions Precautions Precautions: None Restrictions Weight  Bearing Restrictions: No      Mobility  Bed Mobility Overal bed mobility: Modified Independent             General bed mobility comments: increased time/effort but no assist given, HOB up    Transfers Overall transfer level: Needs assistance Equipment used: Rolling walker (2 wheels) Transfers: Sit to/from Stand Sit to Stand: Min assist           General transfer comment: cues for hand placement to reduce pressure on RUE s/p cath yesterday, light boost to stand    Ambulation/Gait Ambulation/Gait assistance: Supervision Gait Distance (Feet): 40 Feet Assistive device: Rolling walker (2 wheels) Gait Pattern/deviations: Step-through pattern, Trunk flexed       General Gait Details: primary level of assist for line management, otherwise steady with RW, VSS on RA  Stairs            Wheelchair Mobility    Modified Rankin (Stroke Patients Only)       Balance Overall balance assessment: Mild deficits observed, not formally tested   Sitting balance-Leahy Scale: Good     Standing balance support: Reliant on assistive device for balance Standing balance-Leahy Scale: Fair                               Pertinent Vitals/Pain Pain Assessment Pain Assessment: Faces Pain Score: 0-No pain Faces Pain Scale: No hurt    Home Living Family/patient expects to be discharged to:: Private residence Living Arrangements: Spouse/significant other Available Help at Discharge: Available 24 hours/day;Family Type of Home: House Home Access: Stairs to enter Entrance Stairs-Rails: Right;Left Entrance  Stairs-Number of Steps: 4   Home Layout: One level Home Equipment: Shower seat - built in;Cane - single point;Grab bars - tub/shower Additional Comments: not on O2 at baseline    Prior Function Prior Level of Function : Independent/Modified Independent             Mobility Comments: mod(I) at baseline, no falls in past few months uses hiking stick        Hand Dominance        Extremity/Trunk Assessment   Upper Extremity Assessment Upper Extremity Assessment: Defer to OT evaluation    Lower Extremity Assessment Lower Extremity Assessment: Generalized weakness    Cervical / Trunk Assessment Cervical / Trunk Assessment: Kyphotic  Communication   Communication: No difficulties  Cognition Arousal/Alertness: Awake/alert Behavior During Therapy: WFL for tasks assessed/performed Overall Cognitive Status: Within Functional Limits for tasks assessed                                          General Comments General comments (skin integrity, edema, etc.): VSS RA    Exercises     Assessment/Plan    PT Assessment Patient needs continued PT services  PT Problem List Decreased strength;Decreased balance;Decreased mobility;Decreased knowledge of use of DME;Decreased activity tolerance       PT Treatment Interventions DME instruction;Functional mobility training;Balance training;Patient/family education;Gait training;Therapeutic activities;Therapeutic exercise;Stair training    PT Goals (Current goals can be found in the Care Plan section)  Acute Rehab PT Goals Patient Stated Goal: go home PT Goal Formulation: With patient/family Time For Goal Achievement: 03/03/22 Potential to Achieve Goals: Good    Frequency Min 3X/week     Co-evaluation               AM-PAC PT "6 Clicks" Mobility  Outcome Measure Help needed turning from your back to your side while in a flat bed without using bedrails?: None Help needed moving from lying on your back to sitting on the side of a flat bed without using bedrails?: A Little Help needed moving to and from a bed to a chair (including a wheelchair)?: A Little Help needed standing up from a chair using your arms (e.g., wheelchair or bedside chair)?: A Little Help needed to walk in hospital room?: A Little Help needed climbing 3-5 steps with a railing? : A Little 6  Click Score: 19    End of Session Equipment Utilized During Treatment: Gait belt Activity Tolerance: Patient tolerated treatment well Patient left: in chair;with call bell/phone within reach;with chair alarm set Nurse Communication: Mobility status;Other (comment) (pt did not take meds on tray, nutrition took whole tray meds included) PT Visit Diagnosis: Muscle weakness (generalized) (M62.81);Unsteadiness on feet (R26.81)    Time: 7672-0947 PT Time Calculation (min) (ACUTE ONLY): 31 min   Charges:   PT Evaluation $PT Eval Low Complexity: 1 Low PT Treatments $Gait Training: 8-22 mins       Deniece Ree PT DPT PN2

## 2022-02-17 NOTE — Progress Notes (Addendum)
Mobility Specialist Progress Note:   02/17/22 1318  Mobility  Activity Ambulated with assistance in room  Level of Assistance Minimal assist, patient does 75% or more  Assistive Device Front wheel walker  Distance Ambulated (ft) 12 ft  Activity Response Tolerated well  $Mobility charge 1 Mobility   Pt in chair asking to get back to bed. No complaints of pain. MinA to stand then contact guard throughout ambulation. Left in bed with call bell in reach and all needs met.   Gareth Eagle Cali Cuartas Mobility Specialist Please contact via [employer] or  Rehab Office at [phone]

## 2022-02-17 NOTE — Progress Notes (Addendum)
Rounding Note    Patient Name: HERALD VALLIN Date of Encounter: 02/17/2022  [place] Cardiologist: Sanda Klein, MD    Subjective   Mr. Northup is a 82 year old gentleman with a history of diabetes mellitus, hyperlipidemia, hypertension, peripheral arterial disease, CKD.  We are asked to see him for further evaluation of congestive heart failure.  Patient has had chronic leg edema.  Echocardiogram from February 12, 2022 reveals severe LV dysfunction with EF of 25 to 30%.  He has grade 2 diastolic dysfunction.  He has moderately reduced RV function.  Pulmonary artery pressures are mildly elevated.  No old echo in the epic system to compare   Cath today demonstrated 90% ostial to proximal LAD and 99% ostial LCx and otherwise normal coronary arteries.  RHC showed PAP 62/34mmHg with mean 69mmHg, mRAP 56mmHg, PCWP 96mmHg and LVEDP 23mmHg with CI 3.9.  Given his advanced age and severe LV dysfunction secondary to severe ischemic CM and subtotally occluded dominant LCx and high grade ostial/prox LAD medical therapy was recommended.    Denies any chest pain or SOB today.   Inpatient Medications    Scheduled Meds:  aspirin EC  81 mg Oral Daily   atorvastatin  80 mg Oral q morning   clopidogrel  75 mg Oral Q breakfast   enoxaparin (LOVENOX) injection  40 mg Subcutaneous Q24H   hydrALAZINE  10 mg Oral Q8H   insulin aspart  0-5 Units Subcutaneous QHS   insulin aspart  0-9 Units Subcutaneous TID WC   potassium chloride  20 mEq Oral Daily   sodium chloride flush  3 mL Intravenous Q12H   Continuous Infusions:   PRN Meds: acetaminophen, morphine injection, ondansetron (ZOFRAN) IV, mouth rinse, sodium chloride flush   Vital Signs    Vitals:   02/17/22 0300 02/17/22 0500 02/17/22 0543 02/17/22 0747  BP: 136/63  129/67 136/84  Pulse: 78   80  Resp: 16   18  Temp: 98.5 F (36.9 C)   98.2 F (36.8 C)  TempSrc: Oral   Oral  SpO2: 97%   94%  Weight:  89.4 kg     Height:        Intake/Output Summary (Last 24 hours) at 02/17/2022 6384 Last data filed at 02/17/2022 0700 Gross per 24 hour  Intake --  Output 1200 ml  Net -1200 ml       02/17/2022    5:00 AM 02/16/2022    5:05 AM 02/15/2022    5:00 AM  Last 3 Weights  Weight (lbs) 197 lb 1.6 oz 201 lb 4.5 oz 201 lb 11.5 oz  Weight (kg) 89.404 kg 91.3 kg 91.5 kg      Telemetry    NSR - Personally Reviewed  ECG     No new EKG to review- Personally Reviewed  Physical Exam   GEN: Well nourished, well developed in no acute distress HEENT: Normal NECK: No JVD; No carotid bruits LYMPHATICS: No lymphadenopathy CARDIAC:RRR, no murmurs, rubs, gallops RESPIRATORY:  Clear to auscultation without rales, wheezing or rhonchi  ABDOMEN: Soft, non-tender, non-distended MUSCULOSKELETAL:  No edema; No deformity  SKIN: Warm and dry NEUROLOGIC:  Alert and oriented x 3 PSYCHIATRIC:  Normal affect  Labs    High Sensitivity Troponin:   Recent Labs  Lab 02/11/22 1756 02/13/22 0926 02/13/22 1142  TROPONINIHS 75* 904* 800*      Chemistry Recent Labs  Lab 02/13/22 0926 02/14/22 0059 02/15/22 0106 02/16/22 0104 02/17/22 0127  NA  --    < >  137 138 136  K  --    < > 3.9 4.0 4.1  CL  --    < > 99 97* 101  CO2  --    < > $R'30 29 29  'JH$ GLUCOSE  --    < > 126* 97 101*  BUN  --    < > 28* 31* 28*  CREATININE  --    < > 1.95* 1.84* 1.80*  CALCIUM  --    < > 8.0* 8.1* 7.9*  PROT 5.7*  --   --   --   --   ALBUMIN 2.9*  --   --   --   --   AST 21  --   --   --   --   ALT 13  --   --   --   --   ALKPHOS 109  --   --   --   --   BILITOT 1.0  --   --   --   --   GFRNONAA  --    < > 34* 36* 37*  ANIONGAP  --    < > $R'8 12 6   'Ld$ < > = values in this interval not displayed.     Lipids  Recent Labs  Lab 02/14/22 0059  CHOL 104  TRIG 49  HDL 33*  LDLCALC NOT CALCULATED  CHOLHDL 3.2     Hematology Recent Labs  Lab 02/11/22 1756 02/14/22 0059 02/17/22 0127  WBC 6.5 6.3 7.0  RBC 4.28 3.99* 4.12*   HGB 11.4* 10.5* 10.8*  HCT 33.9* 30.9* 31.9*  MCV 79.2* 77.4* 77.4*  MCH 26.6 26.3 26.2  MCHC 33.6 34.0 33.9  RDW 17.7* 16.5* 16.3*  PLT 231 233 234    Thyroid  Recent Labs  Lab 02/14/22 0059  TSH 1.431     BNP Recent Labs  Lab 02/11/22 1756  BNP 3,275.7*     DDimer No results for input(s): "DDIMER" in the last 168 hours.   Radiology    DG Chest 2 View  Result Date: 02/16/2022 CLINICAL DATA:  Pleural effusion. EXAM: CHEST - 2 VIEW COMPARISON:  February 11, 2022. FINDINGS: Stable cardiomediastinal silhouette. Stable left pleural effusion is noted with associated left basilar atelectasis or infiltrate. Mildly increased right basilar opacity is noted concerning for worsening atelectasis or infiltrate with associated effusion. Bony thorax is unremarkable. IMPRESSION: Stable left basilar opacity as described above. Increased right basilar opacity is noted concerning for worsening atelectasis or infiltrate with associated effusion. Aortic Atherosclerosis (ICD10-I70.0). Electronically Signed   By: Marijo Conception M.D.   On: 02/16/2022 11:54   DG Chest Bilateral Decubitus  Result Date: 02/16/2022 CLINICAL DATA:  Pleural effusions. EXAM: CHEST - BILATERAL DECUBITUS VIEW COMPARISON:  Chest radiograph the same day 02/16/2022. FINDINGS: Layering bilateral pleural effusions. IMPRESSION: Layering bilateral pleural effusions. Two view chest radiograph dictated separately. Electronically Signed   By: Lorin Picket M.D.   On: 02/16/2022 11:52   CARDIAC CATHETERIZATION  Result Date: 02/16/2022 Images from the original result were not included.   Ost Cx to Prox Cx lesion is 99% stenosed.   Ost LAD to Prox LAD lesion is 90% stenosed. OCTAVIOUS ZIDEK is a 82 y.o. male  [id number] LOCATION:  FACILITY: [facility] PHYSICIAN: Quay Burow, M.D. Nov 14, 1940 DATE OF PROCEDURE:  02/16/2022 DATE OF DISCHARGE: CARDIAC CATHETERIZATION History obtained from chart review.CARA AGUINO is a 82 y.o. male with a hx of  DM, HTN, HLD, PAD s/p several  prior LE interventions followed by [facility] on Plavix, CKD stage IIIb- IV (baseline Cr 1.66-1.98 in 2023 per [facility]) followed at [facility], carotid artery disease (1-39% BICA in 2018) who is being seen 02/13/2022 for the evaluation of CHF at the request of Dr. Doristine Bosworth.  He has been diuresed 11 L.  His troponins were not mildly.  His 2D echo revealed severe LV dysfunction without valvular abnormalities.  He was referred for right left heart cath to define his anatomy and physiology. HEMODYNAMICS: \ 1: Right atrial pressure-9/8, mean of 6 2: Right ventricular pressure-63/2 3: Pulm artery pressure-62/21, mean 37 4: Pulmonary wedge pressure-A-wave 14, V wave 12, mean 12 5: LVEDP-10 6: Cardiac output-7.8 L/min with an index of 3.9 L/min/m    Mr. Dombkowski has severe ischemic cardiomyopathy with a subtotally occluded dominant circumflex and high-grade ostial/proximal LAD.  His initial presentation was of heart failure.  He denies chest pain.  Given his age and LV dysfunction I would recommend continued medical therapy at this time.  The radial sheath and antecubital sheath were removed.  A TR band was placed on the right wrist to achieve patent hemostasis.  The patient left lab in stable condition.  Dr. Radford Pax, the patient's attending cardiologist, was notified of these results. Quay Burow. MD, [facility] 02/16/2022 8:56 AM     Cardiac Studies    Cardiac Cath 02/16/22 HEMODYNAMICS:    1: Right atrial pressure-9/8, mean of 6 2: Right ventricular pressure-63/2 3: Pulm artery pressure-62/21, mean 37 4: Pulmonary wedge pressure-A-wave 14, V wave 12, mean 12 5: LVEDP-10 6: Cardiac output-7.8 L/min with an index of 3.9 L/min/m   IMPRESSION: Mr. Mosco has severe ischemic cardiomyopathy with a subtotally occluded dominant circumflex and high-grade ostial/proximal LAD.  His initial presentation was of heart failure.  He denies chest pain.  Given his age and LV dysfunction I would  recommend continued medical therapy at this time.  The radial sheath and antecubital sheath were removed.  A TR band was placed on the right wrist to achieve patent hemostasis.  The patient left lab in stable condition.  Dr. Radford Pax, the patient's attending cardiologist, was notified of these results.  Diagnostic Dominance: Left     Patient Profile     82 y.o. male  with a hx of DM, HTN, HLD, PAD s/p several prior LE interventions followed by VVS on Plavix, CKD stage IIIb- IV (baseline Cr 1.66-1.98 in 2023 per [facility]) followed at [facility], carotid artery disease (1-39% BICA in 2018) who is being seen 02/13/2022 for the evaluation of CHF at the request of Dr. Doristine Bosworth.   Assessment & Plan    1.  Acute on chronic combined systolic and diastolic congestive heart failure:  -echo with EF 25 to 30%.   -RHC this admission with normal right sided pressures and left heart cath with PCW/LVEDP -LHC with severe 2 vessel CAD with subtotalled dominant ostial LCx and high grade Ostial/prox LAD>medical therapy recommended given advanced age and severe LV dysfunction -he volume status appears good by RHC -Diuretics remain on hold due to AKI -creatinine decreased slightly from 1.84-1.8 today -not a candidate for ACE/ARB/ARNI/MRA/SGLT2i due to AKI -Hydralazine 10 mg 3 times daily was started yesterday blood pressure is stable.   -Start Carvedilol 3.125 mg twice daily starting today -if BP tolerates addition of BB then will add Imdur $RemoveB'30mg'WDZWpULI$  daily -Continue to titrate heart failure meds as BP tolerates  2.  Bilateral pleural effusions:  -Repeat chest x-ray yesterday showed a  stable left basilar opacity and increased basilar opacity on the right concerning for worsening atelectasis or infiltrate with associated effusion -Decubitus films show layering bilateral pleural effusions. -will defer to [facility] whether thoracentesis would be of benefit>>Cannot tell how much of the opacity is due to atelectasis or  effusion on the right -Add incentive Spirometry and patient needs to get out of bed to chair  3.  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 with subtotalled dominant ostial LCx and high grade Ostial/prox LAD>medical therapy recommended given advanced age and severe LV dysfunction -Denies chest pain or pressure -continue ASA $RemoveBefor'81mg'WVtsxCYZefqd$  daily, Plavix $RemoveBefor'75mg'RDJvtxvrDent$  daily and Atorvastatin $RemoveBeforeDE'80mg'RNiqKzBEvamzkvb$  daily -Start carvedilol 3.125 mg twice daily  -If BP tolerates addition of carvedilol will start Imdur 30 mg daily  I have spent a total of 35 minutes with patient reviewing cardiac cath, 2D echo , telemetry, EKGs, labs and examining patient as well as establishing an assessment and plan that was discussed with the patient.  > 50% of time was spent in direct patient care.        For questions or updates, please contact [place] Please consult [email or web address] for contact info under        Signed, Fransico Him, MD  02/17/2022, 9:04 AM

## 2022-02-17 NOTE — Progress Notes (Addendum)
   Heart Failure Stewardship Pharmacist Progress Note   PCP: Kathalene Frames, MD PCP-Cardiologist: Sanda Klein, MD    HPI:  82 yo M with PMH of T2DM, HTN, HLD, PAD, CKD IV, and carotid artery disease.   He presented to the ED on 1/31 with shortness of breath and LE edema. CXR with mild to moderate L and R pleural effusions. ECHO 2/1 showed LVEF 25-30% with global hypokinesis, G2DD, and RV moderately reduced. R/LHC today showed severe ischemic disease with 2 vessel CAD, recommending medical management. RA 6, PA 37, wedge 12, CO 7.8, CI 3.9.  Current HF Medications: Beta Blocker: carvedilol 3.125 mg BID Other: hydralazine 10 mg TID  Prior to admission HF Medications: Diuretic: torsemide 20 mg daily (patient reports taking every other day) ACE/ARB/ARNI: telmisartan 80 mg daily  Pertinent Lab Values: Serum creatinine 1.80, BUN 28, Potassium 4.1, Sodium 136, BNP 3275.7, A1c 5.6   Vital Signs: Weight: 197 lbs (admission weight: 228 lbs) Blood pressure: 110-130/70s  Heart rate: 70-80s  I/O: -0.7L yesterday; net -13L  Medication Assistance / Insurance Benefits Check: Does the patient have prescription insurance?  Yes Type of insurance plan: [employer]  Outpatient Pharmacy:  Prior to admission outpatient pharmacy: [facility] Is the patient willing to use [facility] at discharge? Yes    Assessment: 1. Acute systolic CHF (LVEF 49-44%). NYHA class II symptoms. - Off IV lasix, wedge 12 on cath. Strict I/Os and daily weights. Keep K>4. - Agree with starting carvedilol 3.125 mg BID today - Holding irbesartan from AKI. He has h/o cough with ACEI and prior itching and hand swelling with losartan, though was on telimsartan as OP and was tolerating irbesartan here.  - Continue hydralazine 10 mg TID, consider starting Imdur 30 mg daily tomorrow  Plan: 1) Medication changes recommended at this time: - Start Imdur 30 mg daily tomorrow  2) Patient  assistance: - Farxiga/Jardiance copay $0 - Entresto copay $0  3)  Education  - To be completed prior to discharge  Kerby Nora, PharmD, BCPS Heart Failure Stewardship Pharmacist Phone [phone]

## 2022-02-18 DIAGNOSIS — E1169 Type 2 diabetes mellitus with other specified complication: Secondary | ICD-10-CM | POA: Diagnosis not present

## 2022-02-18 DIAGNOSIS — N179 Acute kidney failure, unspecified: Secondary | ICD-10-CM | POA: Diagnosis not present

## 2022-02-18 DIAGNOSIS — E785 Hyperlipidemia, unspecified: Secondary | ICD-10-CM

## 2022-02-18 DIAGNOSIS — I5023 Acute on chronic systolic (congestive) heart failure: Secondary | ICD-10-CM

## 2022-02-18 DIAGNOSIS — I10 Essential (primary) hypertension: Secondary | ICD-10-CM | POA: Diagnosis not present

## 2022-02-18 DIAGNOSIS — N184 Chronic kidney disease, stage 4 (severe): Secondary | ICD-10-CM | POA: Diagnosis not present

## 2022-02-18 DIAGNOSIS — I2510 Atherosclerotic heart disease of native coronary artery without angina pectoris: Secondary | ICD-10-CM | POA: Diagnosis not present

## 2022-02-18 DIAGNOSIS — I5043 Acute on chronic combined systolic (congestive) and diastolic (congestive) heart failure: Secondary | ICD-10-CM | POA: Diagnosis not present

## 2022-02-18 DIAGNOSIS — J90 Pleural effusion, not elsewhere classified: Secondary | ICD-10-CM | POA: Diagnosis not present

## 2022-02-18 LAB — GLUCOSE, CAPILLARY
Glucose-Capillary: 104 mg/dL — ABNORMAL HIGH (ref 70–99)
Glucose-Capillary: 135 mg/dL — ABNORMAL HIGH (ref 70–99)
Glucose-Capillary: 156 mg/dL — ABNORMAL HIGH (ref 70–99)
Glucose-Capillary: 141 mg/dL — ABNORMAL HIGH (ref 70–99)

## 2022-02-18 LAB — LIPOPROTEIN A (LPA): Lipoprotein (a): 63.6 nmol/L — ABNORMAL HIGH (ref <75.0)

## 2022-02-18 LAB — BASIC METABOLIC PANEL
Anion gap: 13 (ref 5–15)
BUN: 35 mg/dL — ABNORMAL HIGH (ref 8–23)
CO2: 25 mmol/L (ref 22–32)
Calcium: 8.2 mg/dL — ABNORMAL LOW (ref 8.9–10.3)
Chloride: 99 mmol/L (ref 98–111)
Creatinine, Ser: 2.34 mg/dL — ABNORMAL HIGH (ref 0.61–1.24)
GFR, Estimated: 27 mL/min — ABNORMAL LOW (ref >60)
Glucose, Bld: 97 mg/dL (ref 70–99)
Potassium: 4.1 mmol/L (ref 3.5–5.1)
Sodium: 137 mmol/L (ref 135–145)

## 2022-02-18 MED ORDER — ENOXAPARIN SODIUM 30 MG/0.3ML IJ SOSY
30.0000 mg | PREFILLED_SYRINGE | INTRAMUSCULAR | Status: DC
  Administered 2022-02-19 – 2022-02-22 (×4): 30 mg via SUBCUTANEOUS
  Filled 2022-02-18 (×4): qty 0.3

## 2022-02-18 MED ORDER — ISOSORBIDE MONONITRATE ER 30 MG PO TB24
30.0000 mg | ORAL_TABLET | Freq: Every day | ORAL | Status: DC
  Administered 2022-02-18: 30 mg via ORAL
  Filled 2022-02-18: qty 1

## 2022-02-18 NOTE — Progress Notes (Signed)
PT Cancellation Note  Patient Details Name: Daniel Warner MRN: [id number] DOB: September 04, 1940   Cancelled Treatment:    Reason Eval/Treat Not Completed: Patient declined, no reason specified. Pt has politely declined PT 2x this AM, first time due to being fatigued from recently getting up and second time due to eating at the time. Pt requesting PT check back later. Will plan to follow-up later as time permits.   Moishe Spice, PT, DPT Acute Rehabilitation Services  Office: [place] 02/18/2022, 10:49 AM

## 2022-02-18 NOTE — Hospital Course (Addendum)
Daniel Warner was admitted to the hospital with the working diagnosis of decompensated heart failure.  82 yo male with the past medical history of heart failure, CKD stage 4, T2DM, and hypertension who presented with dyspnea. Reported worsening dyspnea for 4 weeks, along with lower extremity edema. On the day of admission he was diagnosed with pulmonary edema at a local urgent care and was referred to the ED for further treatment. He has not been compliant with oral diuretic therapy at home. On his initial physical examination his blood pressure was 159/80, HR 85. RR 16 and 02 saturation 95%, lungs with decreased breath sounds at bases, heart with S1 and S2 present and regular, abdomen with no distention and positive +++ lower extremity edema.   Na 138, K 4,4 CL 105 bicarbonate 25, glucose 112, bun 17 cr 1,71  BMP 3,275 ml.  High sensitive troponin 72, 904, 800.  Wbc 6.5 hgb 11.4 plt 231.   Chest radiograph with cardiomegaly, bilateral hilar vascular congestion, cephalization of the vasculature and bilateral interstitial infiltrates. Bilateral pleural effusions, more left than right.  EKG 81 bpm, normal axis, first degree AV block, sinus rhythm with poor R R wave progression with no significant ST segment or T wave changes.   Patient was placed on IV furosemide for diuresis.  Had cardiac catheterization with improvement in filling pressures.  Positive pre capillary pulmonary hypertension.   02/07 volume status has been improving.  02/08 holding furosemide due to rise in serum cr.  02/09 renal function starting to stabilize.  02/10 continue to improve volume status, plan to continue with oral diuretics and follow up as outpatient.  02/11 continue to improve volume status and follow up as outpatient.

## 2022-02-18 NOTE — Progress Notes (Signed)
Progress Note   Patient: Daniel Warner DHR:[id number] DOB: 1940/03/22 DOA: 02/11/2022     6 DOS: the patient was seen and examined on 02/18/2022   Brief hospital course: Mr. Malia was admitted to the hospital with the working diagnosis of decompensated heart failure.  82 yo male with the past medical history of heart failure, CKD stage 4, T2DM, and hypertension who presented with dyspnea. Reported worsening dyspnea for 4 weeks, along with lower extremity edema. On the day of admission he was diagnosed with pulmonary edema at a local urgent care and was referred to the ED for further treatment. He has not been compliant with oral diuretic therapy at home. On his initial physical examination his blood pressure was 159/80, HR 85. RR 16 and 02 saturation 95%, lungs with decreased breath sounds at bases, heart with S1 and S2 present and regular, abdomen with no distention and positive +++ lower extremity edema.   Chest radiograph with cardiomegaly, bilateral hilar vascular congestion, cephalization of the vasculature and bilateral interstitial infiltrates. Bilateral pleural effusions, more left than right.  EKG 81 bpm, normal axis, first degree AV block, sinus rhythm with poor R R wave progression with no significant ST segment or T wave changes.   Patient was placed on IV furosemide for diuresis.  Had cardiac catheterization with improvement in filling pressures.  Positive pre capillary pulmonary hypertension.   02/07 volume status has been improving.      Assessment and Plan: * Acute on chronic systolic (congestive) heart failure (HCC) Echocardiogram with reduced LV systolic function EF 25 to 30%, global hypokinesis, RV systolic function with moderate reduction, moderate dilatation LA and RA. RVSP 38,0. No significant valvular disease.   Pulmonary hypertension] Acute on chronic core pulmonale.   02/05 cardiac catheterization  RA 9/8 RV 63/2  PA 62/21 mean 37  PCWP 14 Cardiac output  7,8 and index 3.9   Precapillary pulmonary hypertension.   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   Urine output  not documented Since admission is -12,709 ml negative Systolic blood pressure is 118 to 104 mmHg.   Continue medical therapy with carvedilol. After load reduction with hydralazine and isosorbide.  Holding on RAS inh and SGLT 2 inh due to reduced GFR.   Coronary artery disease, continue antiplatelet therapy with aspirin and clopidogrel.  Acute pulmonary edema with bilateral pleural effusions, (acute). Volume status has improved.  Continue oxymetry monitoring.  May need supplemental home 02 for pulmonary hypertension.   Essential hypertension Blood pressure control with carvedilol, hydralazine and isosorbide.   Chronic kidney disease (CKD), stage IV (severe) (HCC) Renal function with serum cr at 1.80 yesterday.  Follow up renal panel today.  Keep K at 4 and Mg at 2   Type 2 diabetes mellitus with hyperlipidemia ([place]) His glucose has been stable, Capillary glucose 152, 104 and 135. Hold on insulin therapy for now.         Subjective: Patient is feeling better, dyspnea and edema continue to improve, he has been weak and deconditioned, not back to his baseline   Physical Exam: Vitals:   02/18/22 0333 02/18/22 0454 02/18/22 0734 02/18/22 1136  BP: (!) 124/59  118/66 (!) 104/55  Pulse:   70 64  Resp:   17 17  Temp: 98.1 F (36.7 C)  97.9 F (36.6 C) 98 F (36.7 C)  TempSrc: Oral  Oral Oral  SpO2:   98% 95%  Weight:  89.5 kg  Height:       Neurology awake and alert ENT with mild pallor Cardiovascular with S1 and S2 present and rhythmic with no gallops, rubs or murmurs Respiratory with no rales or wheezing  Abdomen with no distention Trace lower extremity edema  Data Reviewed:    Family Communication: I spoke with patient's wife at the bedside, we talked in detail about  patient's condition, plan of care and prognosis and all questions were addressed.   Disposition: Status is: Inpatient Remains inpatient appropriate because: heart failure possible dc in 24 to 48 hrs   Planned Discharge Destination: Home      Author: Tawni Millers, MD 02/18/2022 11:54 AM  For on call review [email or web address].

## 2022-02-18 NOTE — Progress Notes (Addendum)
Rounding Note    Patient Name: Daniel Warner Date of Encounter: 02/18/2022  [place] Cardiologist: Sanda Klein, MD    Subjective   Daniel Warner is a 82 year old gentleman with a history of diabetes mellitus, hyperlipidemia, hypertension, peripheral arterial disease, CKD.  We are asked to see him for further evaluation of congestive heart failure.  Patient has had chronic leg edema.  Echocardiogram from February 12, 2022 reveals severe LV dysfunction with EF of 25 to 30%.  He has grade 2 diastolic dysfunction.  He has moderately reduced RV function.  Pulmonary artery pressures are mildly elevated.  No old echo in the epic system to compare   Cath today demonstrated 90% ostial to proximal LAD and 99% ostial LCx and otherwise normal coronary arteries.  RHC showed PAP 62/41mmHg with mean 39mmHg, mRAP 34mmHg, PCWP 70mmHg and LVEDP 83mmHg with CI 3.9.  Given his advanced age and severe LV dysfunction secondary to severe ischemic CM and subtotally occluded dominant LCx and high grade ostial/prox LAD medical therapy was recommended.    Doing Well today without any chest pain or shortness of breath.  Inpatient Medications    Scheduled Meds:  aspirin EC  81 mg Oral Daily   atorvastatin  80 mg Oral q morning   carvedilol  3.125 mg Oral BID WC   clopidogrel  75 mg Oral Q breakfast   enoxaparin (LOVENOX) injection  40 mg Subcutaneous Q24H   hydrALAZINE  10 mg Oral Q8H   insulin aspart  0-5 Units Subcutaneous QHS   insulin aspart  0-9 Units Subcutaneous TID WC   potassium chloride  20 mEq Oral Daily   sodium chloride flush  3 mL Intravenous Q12H   Continuous Infusions:   PRN Meds: acetaminophen, morphine injection, ondansetron (ZOFRAN) IV, mouth rinse, sodium chloride flush   Vital Signs    Vitals:   02/18/22 0000 02/18/22 0333 02/18/22 0454 02/18/22 0734  BP: 120/60 (!) 124/59  118/66  Pulse: 68   70  Resp: 20   17  Temp:  98.1 F (36.7 C)  97.9 F (36.6 C)   TempSrc:  Oral  Oral  SpO2: 97%   98%  Weight:   89.5 kg   Height:        Intake/Output Summary (Last 24 hours) at 02/18/2022 1011 Last data filed at 02/18/2022 0932 Gross per 24 hour  Intake --  Output 1 ml  Net -1 ml      02/18/2022    4:54 AM 02/17/2022    5:00 AM 02/16/2022    5:05 AM  Last 3 Weights  Weight (lbs) 197 lb 4.8 oz 197 lb 1.6 oz 201 lb 4.5 oz  Weight (kg) 89.495 kg 89.404 kg 91.3 kg      Telemetry    Normal sinus rhythm- Personally Reviewed  ECG     No new EKG to review- Personally Reviewed  Physical Exam   GEN: Well nourished, well developed in no acute distress HEENT: Normal NECK: No JVD; No carotid bruits LYMPHATICS: No lymphadenopathy CARDIAC:RRR, no murmurs, rubs, gallops RESPIRATORY:  decreased BS at bases bilaterally ABDOMEN: Soft, non-tender, non-distended MUSCULOSKELETAL:  No edema; No deformity  SKIN: Warm and dry NEUROLOGIC:  Alert and oriented x 3 PSYCHIATRIC:  Normal affect  Labs    High Sensitivity Troponin:   Recent Labs  Lab 02/11/22 1756 02/13/22 0926 02/13/22 1142  TROPONINIHS 75* 904* 800*     Chemistry Recent Labs  Lab 02/13/22 8416 02/14/22 0059 02/15/22 0106  02/16/22 0104 02/16/22 0825 02/16/22 0828 02/17/22 0127  NA  --    < > 137 138 140  140 140 136  K  --    < > 3.9 4.0 4.0  4.0 4.0 4.1  CL  --    < > 99 97*  --   --  101  CO2  --    < > 30 29  --   --  29  GLUCOSE  --    < > 126* 97  --   --  101*  BUN  --    < > 28* 31*  --   --  28*  CREATININE  --    < > 1.95* 1.84*  --   --  1.80*  CALCIUM  --    < > 8.0* 8.1*  --   --  7.9*  PROT 5.7*  --   --   --   --   --   --   ALBUMIN 2.9*  --   --   --   --   --   --   AST 21  --   --   --   --   --   --   ALT 13  --   --   --   --   --   --   ALKPHOS 109  --   --   --   --   --   --   BILITOT 1.0  --   --   --   --   --   --   GFRNONAA  --    < > 34* 36*  --   --  37*  ANIONGAP  --    < > 8 12  --   --  6   < > = values in this interval not displayed.     Lipids  Recent Labs  Lab 02/14/22 0059  CHOL 104  TRIG 49  HDL 33*  LDLCALC NOT CALCULATED  CHOLHDL 3.2    Hematology Recent Labs  Lab 02/11/22 1756 02/14/22 0059 02/16/22 0825 02/16/22 0828 02/17/22 0127  WBC 6.5 6.3  --   --  7.0  RBC 4.28 3.99*  --   --  4.12*  HGB 11.4* 10.5* 11.9*  11.9* 11.9* 10.8*  HCT 33.9* 30.9* 35.0*  35.0* 35.0* 31.9*  MCV 79.2* 77.4*  --   --  77.4*  MCH 26.6 26.3  --   --  26.2  MCHC 33.6 34.0  --   --  33.9  RDW 17.7* 16.5*  --   --  16.3*  PLT 231 233  --   --  234   Thyroid  Recent Labs  Lab 02/14/22 0059  TSH 1.431    BNP Recent Labs  Lab 02/11/22 1756  BNP 3,275.7*    DDimer No results for input(s): "DDIMER" in the last 168 hours.   Radiology    DG Chest 2 View  Result Date: 02/16/2022 CLINICAL DATA:  Pleural effusion. EXAM: CHEST - 2 VIEW COMPARISON:  February 11, 2022. FINDINGS: Stable cardiomediastinal silhouette. Stable left pleural effusion is noted with associated left basilar atelectasis or infiltrate. Mildly increased right basilar opacity is noted concerning for worsening atelectasis or infiltrate with associated effusion. Bony thorax is unremarkable. IMPRESSION: Stable left basilar opacity as described above. Increased right basilar opacity is noted concerning for worsening atelectasis or infiltrate with associated effusion. Aortic Atherosclerosis (ICD10-I70.0). Electronically Signed   By: Jeneen Rinks  Murlean Caller M.D.   On: 02/16/2022 11:54   DG Chest Bilateral Decubitus  Result Date: 02/16/2022 CLINICAL DATA:  Pleural effusions. EXAM: CHEST - BILATERAL DECUBITUS VIEW COMPARISON:  Chest radiograph the same day 02/16/2022. FINDINGS: Layering bilateral pleural effusions. IMPRESSION: Layering bilateral pleural effusions. Two view chest radiograph dictated separately. Electronically Signed   By: Lorin Picket M.D.   On: 02/16/2022 11:52    Cardiac Studies    Cardiac Cath 02/16/22 HEMODYNAMICS:    1: Right atrial pressure-9/8,  mean of 6 2: Right ventricular pressure-63/2 3: Pulm artery pressure-62/21, mean 37 4: Pulmonary wedge pressure-A-wave 14, V wave 12, mean 12 5: LVEDP-10 6: Cardiac output-7.8 L/min with an index of 3.9 L/min/m   IMPRESSION: [name] has severe ischemic cardiomyopathy with a subtotally occluded dominant circumflex and high-grade ostial/proximal LAD.  His initial presentation was of heart failure.  He denies chest pain.  Given his age and LV dysfunction I would recommend continued medical therapy at this time.  The radial sheath and antecubital sheath were removed.  A TR band was placed on the right wrist to achieve patent hemostasis.  The patient left lab in stable condition.  Dr. Radford Pax, the patient's attending cardiologist, was notified of these results.  Diagnostic Dominance: Left     Patient Profile     82 y.o. male  with a hx of DM, HTN, HLD, PAD s/p several prior LE interventions followed by [facility] on Plavix, CKD stage IIIb- IV (baseline Cr 1.66-1.98 in 2023 per [facility]) followed at [facility], carotid artery disease (1-39% BICA in 2018) who is being seen 02/13/2022 for the evaluation of CHF at the request of Dr. Doristine Bosworth.   Assessment & Plan    1.  Acute on chronic combined systolic and diastolic congestive heart failure:  -echo with EF 25 to 30%.   -RHC this admission with normal right sided pressures and left heart cath with PCW/LVEDP -LHC with severe 2 vessel CAD with subtotalled dominant ostial LCx and high grade Ostial/prox LAD>medical therapy recommended given advanced age and severe LV dysfunction -he volume status appears good by RHC -Diuretics remain on hold due to AKI -creatinine decreased slightly yesterday from 1.84-1.8 today -I's and O's are incomplete -Bmet pending today -not a candidate for ACE/ARB/ARNI/MRA/SGLT2i due to AKI -Continue hydralazine 10 mg 3 times daily and carvedilol 3.125 mg twice daily -Add Imdur 30 mg daily today -Continue to titrate  GDMT as BP allows  2.  Bilateral pleural effusions:  -Repeat chest x-ray yesterday showed a stable left basilar opacity and increased basilar opacity on the right concerning for worsening atelectasis or infiltrate with associated effusion -Decubitus films show layering bilateral pleural effusions but small and no thoracentesis indicated at this time. -Continue incentive Spirometry and patient needs to get out of bed to chair  3.  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  > 50% of time was spent in direct patient care.        For questions or updates, please  contact [place] Please consult [email or web address] for contact info under        Signed, Fransico Him, MD  02/18/2022, 10:11 AM

## 2022-02-18 NOTE — Progress Notes (Signed)
Physical Therapy Treatment Patient Details Name: Daniel Warner MRN: [id number] DOB: 1940-04-02 Today's Date: 02/18/2022   History of Present Illness 82 yo male 02/11/22 c/o SOB, BLE edema, showed pleural effusions on CXR, sent to ED for diuresis by OP MD due to acute CHF. Received cardiac cath 2/5. PMH CHF, CKD, DM, HTN    PT Comments    Pt was able to progress to ambulating up to ~340 ft during one bout, but demonstrates deficits in balance when attempting to ambulate with just 1 UE support as he does at baseline. Thus, pt required a RW for increased safety with standing mobility. Pt also demonstrates deficits in lower extremity muscular strength and power, resulting in him needing min-modA to transfer to stand from various surfaces. Updated d/c recs to OPPT to address these deficits and improve his safety and independence with all functional mobility. Will continue to follow acutely.     Recommendations for follow up therapy are one component of a multi-disciplinary discharge planning process, led by the attending physician.  Recommendations may be updated based on patient status, additional functional criteria and insurance authorization.  Follow Up Recommendations  Outpatient PT     Assistance Recommended at Discharge Intermittent Supervision/Assistance  Patient can return home with the following A little help with walking and/or transfers;A little help with bathing/dressing/bathroom;Assistance with cooking/housework;Assist for transportation;Help with stairs or ramp for entrance   Equipment Recommendations  Rolling walker (2 wheels);BSC/3in1    Recommendations for Other Services       Precautions / Restrictions Precautions Precautions: Fall;Other (comment) Precaution Comments: x1 fall off front steps; watch SpO2; R radial access cath 2/5 Restrictions Weight Bearing Restrictions: No     Mobility  Bed Mobility Overal bed mobility: Modified Independent              General bed mobility comments: increased time/effort but no assist given, HOB up    Transfers Overall transfer level: Needs assistance Equipment used: Rolling walker (2 wheels) Transfers: Sit to/from Stand Sit to Stand: Min assist, Mod assist           General transfer comment: cues for hand placement to reduce pressure on RUE s/p cath 2/5. Pt needing minA to transfer to stand from EOB but modA to transfer to stand from low toilet and standard chair without arm rests.    Ambulation/Gait Ambulation/Gait assistance: Supervision, Min guard Gait Distance (Feet): 340 Feet (x2 bouts of ~35 ft > ~340 ft) Assistive device: Rolling walker (2 wheels), 1 person hand held assist Gait Pattern/deviations: Step-through pattern, Trunk flexed, Narrow base of support, Decreased stride length       General Gait Details: Pt with small steps and narrow BOS with flexed posture. Pt needing cues to widen BOS and improve feet clearance. Pt able to ambulate up to ~30 ft with R HHA to simulate him using his cane, but he began to sway more as distance progressed, thus transitioned back to RW. No LOB, min guard-supervision for safety.   Stairs Stairs:  (pt declined attempts today, but encouraged pt to ensure he clears his foot with each stair when he goes home)           Wheelchair Mobility    Modified Rankin (Stroke Patients Only)       Balance Overall balance assessment: Needs assistance Sitting-balance support: No upper extremity supported, Feet supported Sitting balance-Leahy Scale: Good     Standing balance support: No upper extremity supported, Single extremity supported, Bilateral upper extremity supported, During  functional activity Standing balance-Leahy Scale: Fair Standing balance comment: Able to stand statically without UE support, trunk sway noted. Benefits from 1-2 UE support to ambulate                            Cognition Arousal/Alertness: Awake/alert Behavior  During Therapy: WFL for tasks assessed/performed Overall Cognitive Status: Within Functional Limits for tasks assessed                                          Exercises Other Exercises Other Exercises: IS utilization >4x    General Comments General comments (skin integrity, edema, etc.): educated pt to limit drinking sodas and taking in sodium; SpO2 would vary from 84-96% on RA at rest, down to 82% on RA when ambulating, >/= 90% on 3L when ambulating, left on 2L sitting in recliner end of session with sats in the 90s%; encouraged pt to ambulate in hall >/ 3x/day while here      Pertinent Vitals/Pain Pain Assessment Pain Assessment: Faces Faces Pain Scale: No hurt Pain Intervention(s): Monitored during session    Home Living                          Prior Function            PT Goals (current goals can now be found in the care plan section) Acute Rehab PT Goals Patient Stated Goal: go home PT Goal Formulation: With patient/family Time For Goal Achievement: 03/03/22 Potential to Achieve Goals: Good Progress towards PT goals: Progressing toward goals    Frequency    Min 3X/week      PT Plan Discharge plan needs to be updated    Co-evaluation              AM-PAC PT "6 Clicks" Mobility   Outcome Measure  Help needed turning from your back to your side while in a flat bed without using bedrails?: None Help needed moving from lying on your back to sitting on the side of a flat bed without using bedrails?: A Little Help needed moving to and from a bed to a chair (including a wheelchair)?: A Little Help needed standing up from a chair using your arms (e.g., wheelchair or bedside chair)?: A Lot Help needed to walk in hospital room?: A Little Help needed climbing 3-5 steps with a railing? : A Little 6 Click Score: 18    End of Session Equipment Utilized During Treatment: Gait belt;Oxygen Activity Tolerance: Patient tolerated treatment  well Patient left: in chair;with call bell/phone within reach;with chair alarm set;with family/visitor present Nurse Communication: Mobility status;Other (comment) (sats) PT Visit Diagnosis: Muscle weakness (generalized) (M62.81);Unsteadiness on feet (R26.81);Other abnormalities of gait and mobility (R26.89)     Time: 3833-3832 PT Time Calculation (min) (ACUTE ONLY): 30 min  Charges:  $Gait Training: 8-22 mins $Therapeutic Activity: 8-22 mins                     Moishe Spice, PT, DPT Acute Rehabilitation Services  Office: [place] 02/18/2022, 1:18 PM

## 2022-02-18 NOTE — Progress Notes (Signed)
   Heart Failure Stewardship Pharmacist Progress Note   PCP: Kathalene Frames, MD PCP-Cardiologist: Sanda Klein, MD    HPI:  82 yo M with PMH of T2DM, HTN, HLD, PAD, CKD IV, and carotid artery disease.   He presented to the ED on 1/31 with shortness of breath and LE edema. CXR with mild to moderate L and R pleural effusions. ECHO 2/1 showed LVEF 25-30% with global hypokinesis, G2DD, and RV moderately reduced. R/LHC today showed severe ischemic disease with 2 vessel CAD, recommending medical management. RA 6, PA 37, wedge 12, CO 7.8, CI 3.9.  Current HF Medications: Beta Blocker: carvedilol 3.125 mg BID Other: hydralazine 10 mg TID  Prior to admission HF Medications: Diuretic: torsemide 20 mg daily (patient reports taking every other day) ACE/ARB/ARNI: telmisartan 80 mg daily  Pertinent Lab Values: As of 2/6: Serum creatinine 1.80, BUN 28, Potassium 4.1, Sodium 136, BNP 3275.7, A1c 5.6   Vital Signs: Weight: 197 lbs (admission weight: 228 lbs) Blood pressure: 110-120/60s  Heart rate: 60s  I/O: incomplete  Medication Assistance / Insurance Benefits Check: Does the patient have prescription insurance?  Yes Type of insurance plan: [employer]  Outpatient Pharmacy:  Prior to admission outpatient pharmacy: [facility] Is the patient willing to use [facility] pharmacy at discharge? Yes    Assessment: 1. Acute systolic CHF (LVEF 31-43%). NYHA class II symptoms. - Off IV lasix, wedge 12 on cath. Strict I/Os and daily weights. Keep K>4. - Continue carvedilol 3.125 mg BID  - Holding irbesartan from AKI. Labs pending today. He has h/o cough with ACEI and prior itching and hand swelling with losartan, though was on telimsartan as OP and was tolerating irbesartan here.  - Continue hydralazine 10 mg TID, agree with starting Imdur 30 mg daily tomorrow  Plan: 1) Medication changes recommended at this time: - Agree with changes  2) Patient assistance: -  Farxiga/Jardiance copay $0 - Entresto copay $0  3)  Education  - Patient has been educated on current HF medications and potential additions to HF medication regimen - Patient verbalizes understanding that over the next few months, these medication doses may change and more medications may be added to optimize HF regimen - Patient has been educated on basic disease state pathophysiology and goals of therapy   Kerby Nora, PharmD, BCPS Heart Failure Stewardship Pharmacist Phone [phone]

## 2022-02-18 NOTE — Progress Notes (Signed)
SATURATION QUALIFICATIONS: (This note is used to comply with regulatory documentation for home oxygen)  Patient Saturations on Room Air at Rest = 84%  Patient Saturations on Room Air while Ambulating = 82%  Patient Saturations on 3 Liters of oxygen while Ambulating = 90%  Please briefly explain why patient needs home oxygen: Pt required up to 3L of supplemental O2 to maintain sats >/= 90% when ambulating.   Moishe Spice, PT, DPT Acute Rehabilitation Services  Office: [phone]

## 2022-02-19 ENCOUNTER — Ambulatory Visit: Payer: PPO | Admitting: Interventional Cardiology

## 2022-02-19 ENCOUNTER — Encounter (HOSPITAL_COMMUNITY): Payer: Self-pay | Admitting: Family Medicine

## 2022-02-19 DIAGNOSIS — N184 Chronic kidney disease, stage 4 (severe): Secondary | ICD-10-CM | POA: Diagnosis not present

## 2022-02-19 DIAGNOSIS — I10 Essential (primary) hypertension: Secondary | ICD-10-CM | POA: Diagnosis not present

## 2022-02-19 DIAGNOSIS — I5023 Acute on chronic systolic (congestive) heart failure: Secondary | ICD-10-CM | POA: Diagnosis not present

## 2022-02-19 DIAGNOSIS — E1169 Type 2 diabetes mellitus with other specified complication: Secondary | ICD-10-CM | POA: Diagnosis not present

## 2022-02-19 LAB — BASIC METABOLIC PANEL
Anion gap: 8 (ref 5–15)
BUN: 38 mg/dL — ABNORMAL HIGH (ref 8–23)
CO2: 27 mmol/L (ref 22–32)
Calcium: 7.7 mg/dL — ABNORMAL LOW (ref 8.9–10.3)
Chloride: 100 mmol/L (ref 98–111)
Creatinine, Ser: 2.43 mg/dL — ABNORMAL HIGH (ref 0.61–1.24)
GFR, Estimated: 26 mL/min — ABNORMAL LOW (ref >60)
Glucose, Bld: 109 mg/dL — ABNORMAL HIGH (ref 70–99)
Potassium: 4.1 mmol/L (ref 3.5–5.1)
Sodium: 135 mmol/L (ref 135–145)

## 2022-02-19 LAB — GLUCOSE, CAPILLARY
Glucose-Capillary: 101 mg/dL — ABNORMAL HIGH (ref 70–99)
Glucose-Capillary: 127 mg/dL — ABNORMAL HIGH (ref 70–99)
Glucose-Capillary: 142 mg/dL — ABNORMAL HIGH (ref 70–99)

## 2022-02-19 LAB — MAGNESIUM: Magnesium: 2 mg/dL (ref 1.7–2.4)

## 2022-02-19 MED ORDER — ISOSORBIDE MONONITRATE ER 30 MG PO TB24
15.0000 mg | ORAL_TABLET | Freq: Every day | ORAL | Status: DC
  Administered 2022-02-19 – 2022-02-22 (×4): 15 mg via ORAL
  Filled 2022-02-19 (×4): qty 1

## 2022-02-19 NOTE — Progress Notes (Signed)
Physical Therapy Treatment Patient Details Name: Daniel Warner MRN: [id number] DOB: 10-Nov-1940 Today's Date: 02/19/2022   History of Present Illness 82 yo male 02/11/22 c/o SOB, BLE edema, showed pleural effusions on CXR, sent to ED for diuresis by OP MD due to acute CHF. Received cardiac cath 2/5. PMH CHF, CKD, DM, HTN    PT Comments    Pt greeted semi-reclined in bed and agreeable to session with progress towards acute goals. Pt continues to demonstrate  most difficulty with tranfers sit>stand, needing assist to power up and steady on rise. Pt with fair stability throughout gait with RW and min guard for safety with cues throughout for posture and RW proximity. Pt able to ascend/descend 3 steps in stairwell with no overt LOB, however pt with heavy rail use on L and effortful ascent with short rest needed after each step secondary to decreased LE strength and power. Current plan remains appropriate to address deficits and maximize functional independence and safety. Pt continues to benefit from skilled PT services to progress toward functional mobility goals.    Recommendations for follow up therapy are one component of a multi-disciplinary discharge planning process, led by the attending physician.  Recommendations may be updated based on patient status, additional functional criteria and insurance authorization.  Follow Up Recommendations  Outpatient PT     Assistance Recommended at Discharge Intermittent Supervision/Assistance  Patient can return home with the following A little help with walking and/or transfers;A little help with bathing/dressing/bathroom;Assistance with cooking/housework;Assist for transportation;Help with stairs or ramp for entrance   Equipment Recommendations  Rolling walker (2 wheels);BSC/3in1    Recommendations for Other Services       Precautions / Restrictions Precautions Precautions: Fall;Other (comment) Precaution Comments: x1 fall off front steps;  watch SpO2; R radial access cath 2/5 Restrictions Weight Bearing Restrictions: No     Mobility  Bed Mobility Overal bed mobility: Modified Independent             General bed mobility comments: increased time/effort but no assist given, HOB up    Transfers Overall transfer level: Needs assistance Equipment used: Rolling walker (2 wheels) Transfers: Sit to/from Stand Sit to Stand: Min assist, Min guard           General transfer comment: cues for hand placement to reduce pressure on RUE s/p cath 2/5. min a to facilitate anterior wehgt shift    Ambulation/Gait Ambulation/Gait assistance: Supervision, Min guard Gait Distance (Feet): 400 Feet Assistive device: Rolling walker (2 wheels), 1 person hand held assist Gait Pattern/deviations: Step-through pattern, Trunk flexed, Narrow base of support, Decreased stride length       General Gait Details: Pt with small steps and narrow BOS with flexed posture. Pt needing cues to widen BOS and improve feet clearance and to keep RW in contact with floor as pt wth tendecy to lift during turns. min guard for safety   Stairs Stairs: Yes Stairs assistance: Min guard, Min assist (assist for lines/leads) Stair Management: Two rails, One rail Left, Step to pattern, Forwards Number of Stairs: 3 General stair comments: up forward and down sideways as pt reporting 1 rail at home. no LOB, effortful and heavy rail use on L with LUE on ascent. cues on descent to make room for both feet on tread   Wheelchair Mobility    Modified Rankin (Stroke Patients Only)       Balance Overall balance assessment: Needs assistance Sitting-balance support: No upper extremity supported, Feet supported Sitting balance-Leahy Scale: Good  Standing balance support: No upper extremity supported, Single extremity supported, Bilateral upper extremity supported, During functional activity Standing balance-Leahy Scale: Fair Standing balance comment: Able  to stand statically without UE support, trunk sway noted. Benefits from 1-2 UE support to ambulate                            Cognition Arousal/Alertness: Awake/alert Behavior During Therapy: WFL for tasks assessed/performed Overall Cognitive Status: Within Functional Limits for tasks assessed                                          Exercises      General Comments General comments (skin integrity, edema, etc.): SpO2 92% on RA at rest on arrival, dropping to 87% in static standing, 2L applied with SpO2 >90% throughout activity, return to RA at end of session with SpO2 94%      Pertinent Vitals/Pain Pain Assessment Pain Assessment: Faces Faces Pain Scale: Hurts a little bit Pain Location: bil toes to touch Pain Descriptors / Indicators: Tender Pain Intervention(s): Monitored during session, Limited activity within patient's tolerance    Home Living                          Prior Function            PT Goals (current goals can now be found in the care plan section) Acute Rehab PT Goals Patient Stated Goal: go home PT Goal Formulation: With patient/family Time For Goal Achievement: 03/03/22 Progress towards PT goals: Progressing toward goals    Frequency    Min 3X/week      PT Plan      Co-evaluation              AM-PAC PT "6 Clicks" Mobility   Outcome Measure  Help needed turning from your back to your side while in a flat bed without using bedrails?: None Help needed moving from lying on your back to sitting on the side of a flat bed without using bedrails?: A Little Help needed moving to and from a bed to a chair (including a wheelchair)?: A Little Help needed standing up from a chair using your arms (e.g., wheelchair or bedside chair)?: A Lot Help needed to walk in hospital room?: A Little Help needed climbing 3-5 steps with a railing? : A Little 6 Click Score: 18    End of Session Equipment Utilized During  Treatment: Gait belt;Oxygen Activity Tolerance: Patient tolerated treatment well Patient left: with call bell/phone within reach;in bed Nurse Communication: Mobility status PT Visit Diagnosis: Muscle weakness (generalized) (M62.81);Unsteadiness on feet (R26.81);Other abnormalities of gait and mobility (R26.89)     Time: 2725-3664 PT Time Calculation (min) (ACUTE ONLY): 30 min  Charges:  $Gait Training: 8-22 mins $Therapeutic Activity: 8-22 mins                     Purvi Ruehl R. PTA Acute Rehabilitation Services Office: [place] 02/19/2022, 1:34 PM

## 2022-02-19 NOTE — Progress Notes (Signed)
Rounding Note    Patient Name: Daniel Warner Date of Encounter: 02/19/2022  [place] Cardiologist: Sanda Klein, MD    Subjective   Mr. Huge is a 82 year old gentleman with a history of diabetes mellitus, hyperlipidemia, hypertension, peripheral arterial disease, CKD.  We are asked to see him for further evaluation of congestive heart failure.  Patient has had chronic leg edema.  Echocardiogram from February 12, 2022 reveals severe LV dysfunction with EF of 25 to 30%.  He has grade 2 diastolic dysfunction.  He has moderately reduced RV function.  Pulmonary artery pressures are mildly elevated.  No old echo in the epic system to compare   Cath this admission demonstrated 90% ostial to proximal LAD and 99% ostial LCx and otherwise normal coronary arteries.  RHC showed PAP 62/32mmHg with mean 25mmHg, mRAP 4mmHg, PCWP 69mmHg and LVEDP 48mmHg with CI 3.9.  Given his advanced age and severe LV dysfunction secondary to severe ischemic CM and subtotally occluded dominant LCx and high grade ostial/prox LAD medical therapy was recommended.    Denies any chest pain or shortness of breath  Inpatient Medications    Scheduled Meds:  aspirin EC  81 mg Oral Daily   atorvastatin  80 mg Oral q morning   carvedilol  3.125 mg Oral BID WC   clopidogrel  75 mg Oral Q breakfast   enoxaparin (LOVENOX) injection  30 mg Subcutaneous Q24H   hydrALAZINE  10 mg Oral Q8H   isosorbide mononitrate  30 mg Oral Daily   potassium chloride  20 mEq Oral Daily   sodium chloride flush  3 mL Intravenous Q12H   Continuous Infusions:   PRN Meds: acetaminophen, morphine injection, ondansetron (ZOFRAN) IV, mouth rinse, sodium chloride flush   Vital Signs    Vitals:   02/18/22 1937 02/18/22 2350 02/19/22 0305 02/19/22 0558  BP: (!) 116/57 (!) 121/59 (!) 117/55   Pulse: 65 65 72   Resp: $Remo'15 15 18   'HPjkU$ Temp: 97.9 F (36.6 C) 98.1 F (36.7 C) 98.2 F (36.8 C)   TempSrc: Oral Oral Oral   SpO2:  95% 99% 97%   Weight:    89.8 kg  Height:        Intake/Output Summary (Last 24 hours) at 02/19/2022 6045 Last data filed at 02/18/2022 2139 Gross per 24 hour  Intake 413 ml  Output 426 ml  Net -13 ml       02/19/2022    5:58 AM 02/18/2022    4:54 AM 02/17/2022    5:00 AM  Last 3 Weights  Weight (lbs) 197 lb 14.4 oz 197 lb 4.8 oz 197 lb 1.6 oz  Weight (kg) 89.767 kg 89.495 kg 89.404 kg      Telemetry    Normal sinus rhythm personally Reviewed  ECG     No new EKG to review- Personally Reviewed  Physical Exam   GEN: Well nourished, well developed in no acute distress HEENT: Normal NECK: No JVD; No carotid bruits LYMPHATICS: No lymphadenopathy CARDIAC:RRR, no murmurs, rubs, gallops RESPIRATORY:  Clear to auscultation without rales, wheezing or rhonchi  ABDOMEN: Soft, non-tender, non-distended MUSCULOSKELETAL:  No edema; No deformity  SKIN: Warm and dry NEUROLOGIC:  Alert and oriented x 3 PSYCHIATRIC:  Normal affect  Labs    High Sensitivity Troponin:   Recent Labs  Lab 02/11/22 1756 02/13/22 0926 02/13/22 1142  TROPONINIHS 75* 904* 800*      Chemistry Recent Labs  Lab 02/13/22 0926 02/14/22 0059 02/17/22 0127  02/18/22 1033 02/19/22 0059  NA  --    < > 136 137 135  K  --    < > 4.1 4.1 4.1  CL  --    < > 101 99 100  CO2  --    < > $R'29 25 27  'yU$ GLUCOSE  --    < > 101* 97 109*  BUN  --    < > 28* 35* 38*  CREATININE  --    < > 1.80* 2.34* 2.43*  CALCIUM  --    < > 7.9* 8.2* 7.7*  MG  --   --   --   --  2.0  PROT 5.7*  --   --   --   --   ALBUMIN 2.9*  --   --   --   --   AST 21  --   --   --   --   ALT 13  --   --   --   --   ALKPHOS 109  --   --   --   --   BILITOT 1.0  --   --   --   --   GFRNONAA  --    < > 37* 27* 26*  ANIONGAP  --    < > $R'6 13 8   'Qq$ < > = values in this interval not displayed.     Lipids  Recent Labs  Lab 02/14/22 0059  CHOL 104  TRIG 49  HDL 33*  LDLCALC NOT CALCULATED  CHOLHDL 3.2     Hematology Recent Labs  Lab  02/14/22 0059 02/16/22 0825 02/16/22 0828 02/17/22 0127  WBC 6.3  --   --  7.0  RBC 3.99*  --   --  4.12*  HGB 10.5* 11.9*  11.9* 11.9* 10.8*  HCT 30.9* 35.0*  35.0* 35.0* 31.9*  MCV 77.4*  --   --  77.4*  MCH 26.3  --   --  26.2  MCHC 34.0  --   --  33.9  RDW 16.5*  --   --  16.3*  PLT 233  --   --  234    Thyroid  Recent Labs  Lab 02/14/22 0059  TSH 1.431     BNP No results for input(s): "BNP", "PROBNP" in the last 168 hours.   DDimer No results for input(s): "DDIMER" in the last 168 hours.   Radiology    No results found.  Cardiac Studies    Cardiac Cath 02/16/22 HEMODYNAMICS:    1: Right atrial pressure-9/8, mean of 6 2: Right ventricular pressure-63/2 3: Pulm artery pressure-62/21, mean 37 4: Pulmonary wedge pressure-A-wave 14, V wave 12, mean 12 5: LVEDP-10 6: Cardiac output-7.8 L/min with an index of 3.9 L/min/m   IMPRESSION: Mr. Caggiano has severe ischemic cardiomyopathy with a subtotally occluded dominant circumflex and high-grade ostial/proximal LAD.  His initial presentation was of heart failure.  He denies chest pain.  Given his age and LV dysfunction I would recommend continued medical therapy at this time.  The radial sheath and antecubital sheath were removed.  A TR band was placed on the right wrist to achieve patent hemostasis.  The patient left lab in stable condition.  Dr. Radford Pax, the patient's attending cardiologist, was notified of these results.  Diagnostic Dominance: Left     Patient Profile     82 y.o. male  with a hx of DM, HTN, HLD, PAD s/p several prior LE interventions followed by  VVS on Plavix, CKD stage IIIb- IV (baseline Cr 1.66-1.98 in 2023 per [facility]) followed at [facility], carotid artery disease (1-39% BICA in 2018) who is being seen 02/13/2022 for the evaluation of CHF at the request of Dr. Doristine Bosworth.   Assessment & Plan    1.  Acute on chronic combined systolic and diastolic congestive heart failure:  -echo with  EF 25 to 30%.   -RHC this admission with normal right sided pressures and left heart cath with PCW/LVEDP -LHC with severe 2 vessel CAD with subtotalled dominant ostial LCx and high grade Ostial/prox LAD>medical therapy recommended given advanced age and severe LV dysfunction -he volume status appears good by RHC -Diuretics remain on hold due to AKI >> serum creatinine bumped over the last 2 days at 2.34 and 2.43 today related to contrast exposure from calf -I's and O's are incomplete -not a candidate for ACE/ARB/ARNI/MRA/SGLT2i due to AKI -BP stable although became soft yesterday afternoon with systolic in the 53G -Continue hydralazine 10 mg 3 times daily and carvedilol 3.125 mg twice daily -Decrease Imdur to 15 mg daily -Continue to titrate GDMT as BP allows  2.  Bilateral pleural effusions:  -Repeat chest x-ray showed a stable left basilar opacity and increased basilar opacity on the right concerning for worsening atelectasis or infiltrate with associated effusion -Decubitus films show layering bilateral pleural effusions but small and no thoracentesis indicated at this time. 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

## 2022-02-19 NOTE — Progress Notes (Addendum)
Rounding Note    Patient Name: Daniel Warner Date of Encounter: 02/19/2022  [place] Cardiologist: Sanda Klein, MD    Subjective   Daniel Warner is a 82 year old gentleman with a history of diabetes mellitus, hyperlipidemia, hypertension, peripheral arterial disease, CKD.  We are asked to see him for further evaluation of congestive heart failure.  Patient has had chronic leg edema.  Echocardiogram from February 12, 2022 reveals severe LV dysfunction with EF of 25 to 30%.  He has grade 2 diastolic dysfunction.  He has moderately reduced RV function.  Pulmonary artery pressures are mildly elevated.  No old echo in the epic system to compare   Cath this admission demonstrated 90% ostial to proximal LAD and 99% ostial LCx and otherwise normal coronary arteries.  RHC showed PAP 62/52mmHg with mean 15mmHg, mRAP 44mmHg, PCWP 23mmHg and LVEDP 35mmHg with CI 3.9.  Given his advanced age and severe LV dysfunction secondary to severe ischemic CM and subtotally occluded dominant LCx and high grade ostial/prox LAD medical therapy was recommended.    Denies any chest pain or shortness of breath  Inpatient Medications    Scheduled Meds:  aspirin EC  81 mg Oral Daily   atorvastatin  80 mg Oral q morning   carvedilol  3.125 mg Oral BID WC   clopidogrel  75 mg Oral Q breakfast   enoxaparin (LOVENOX) injection  30 mg Subcutaneous Q24H   hydrALAZINE  10 mg Oral Q8H   isosorbide mononitrate  15 mg Oral Daily   potassium chloride  20 mEq Oral Daily   sodium chloride flush  3 mL Intravenous Q12H   Continuous Infusions:   PRN Meds: acetaminophen, morphine injection, ondansetron (ZOFRAN) IV, mouth rinse, sodium chloride flush   Vital Signs    Vitals:   02/18/22 1937 02/18/22 2350 02/19/22 0305 02/19/22 0558  BP: (!) 116/57 (!) 121/59 (!) 117/55   Pulse: 65 65 72   Resp: $Remo'15 15 18   'eLdMj$ Temp: 97.9 F (36.6 C) 98.1 F (36.7 C) 98.2 F (36.8 C)   TempSrc: Oral Oral Oral   SpO2:  95% 99% 97%   Weight:    89.8 kg  Height:        Intake/Output Summary (Last 24 hours) at 02/19/2022 0818 Last data filed at 02/18/2022 2139 Gross per 24 hour  Intake 413 ml  Output 426 ml  Net -13 ml       02/19/2022    5:58 AM 02/18/2022    4:54 AM 02/17/2022    5:00 AM  Last 3 Weights  Weight (lbs) 197 lb 14.4 oz 197 lb 4.8 oz 197 lb 1.6 oz  Weight (kg) 89.767 kg 89.495 kg 89.404 kg      Telemetry    Normal sinus rhythm personally Reviewed  ECG     No new EKG to review- Personally Reviewed  Physical Exam   GEN: Well nourished, well developed in no acute distress HEENT: Normal NECK: No JVD; No carotid bruits LYMPHATICS: No lymphadenopathy CARDIAC:RRR, no murmurs, rubs, gallops RESPIRATORY:  Clear to auscultation without rales, wheezing or rhonchi  ABDOMEN: Soft, non-tender, non-distended MUSCULOSKELETAL:  trace B/Ledema; No deformity  SKIN: Warm and dry NEUROLOGIC:  Alert and oriented x 3 PSYCHIATRIC:  Normal affect  Labs    High Sensitivity Troponin:   Recent Labs  Lab 02/11/22 1756 02/13/22 0926 02/13/22 1142  TROPONINIHS 75* 904* 800*      Chemistry Recent Labs  Lab 02/13/22 0926 02/14/22 0059 02/17/22 0127  02/18/22 1033 02/19/22 0059  NA  --    < > 136 137 135  K  --    < > 4.1 4.1 4.1  CL  --    < > 101 99 100  CO2  --    < > $R'29 25 27  'fC$ GLUCOSE  --    < > 101* 97 109*  BUN  --    < > 28* 35* 38*  CREATININE  --    < > 1.80* 2.34* 2.43*  CALCIUM  --    < > 7.9* 8.2* 7.7*  MG  --   --   --   --  2.0  PROT 5.7*  --   --   --   --   ALBUMIN 2.9*  --   --   --   --   AST 21  --   --   --   --   ALT 13  --   --   --   --   ALKPHOS 109  --   --   --   --   BILITOT 1.0  --   --   --   --   GFRNONAA  --    < > 37* 27* 26*  ANIONGAP  --    < > $R'6 13 8   'OF$ < > = values in this interval not displayed.     Lipids  Recent Labs  Lab 02/14/22 0059  CHOL 104  TRIG 49  HDL 33*  LDLCALC NOT CALCULATED  CHOLHDL 3.2     Hematology Recent Labs  Lab  02/14/22 0059 02/16/22 0825 02/16/22 0828 02/17/22 0127  WBC 6.3  --   --  7.0  RBC 3.99*  --   --  4.12*  HGB 10.5* 11.9*  11.9* 11.9* 10.8*  HCT 30.9* 35.0*  35.0* 35.0* 31.9*  MCV 77.4*  --   --  77.4*  MCH 26.3  --   --  26.2  MCHC 34.0  --   --  33.9  RDW 16.5*  --   --  16.3*  PLT 233  --   --  234    Thyroid  Recent Labs  Lab 02/14/22 0059  TSH 1.431     BNP No results for input(s): "BNP", "PROBNP" in the last 168 hours.   DDimer No results for input(s): "DDIMER" in the last 168 hours.   Radiology    No results found.  Cardiac Studies    Cardiac Cath 02/16/22 HEMODYNAMICS:    1: Right atrial pressure-9/8, mean of 6 2: Right ventricular pressure-63/2 3: Pulm artery pressure-62/21, mean 37 4: Pulmonary wedge pressure-A-wave 14, V wave 12, mean 12 5: LVEDP-10 6: Cardiac output-7.8 L/min with an index of 3.9 L/min/m   IMPRESSION: Daniel Warner has severe ischemic cardiomyopathy with a subtotally occluded dominant circumflex and high-grade ostial/proximal LAD.  His initial presentation was of heart failure.  He denies chest pain.  Given his age and LV dysfunction I would recommend continued medical therapy at this time.  The radial sheath and antecubital sheath were removed.  A TR band was placed on the right wrist to achieve patent hemostasis.  The patient left lab in stable condition.  Dr. Radford Pax, the patient's attending cardiologist, was notified of these results.  Diagnostic Dominance: Left     Patient Profile     82 y.o. male  with a hx of DM, HTN, HLD, PAD s/p several prior LE interventions followed by  VVS on Plavix, CKD stage IIIb- IV (baseline Cr 1.66-1.98 in 2023 per [facility]) followed [facility], carotid artery disease (1-39% BICA in 2018) who is being seen 02/13/2022 for the evaluation of CHF at the request of Dr. Doristine Bosworth.   Assessment & Plan    1.  Acute on chronic combined systolic and diastolic congestive heart failure:  -echo with  EF 25 to 30%.   -RHC this admission with normal right sided pressures and left heart cath with PCW/LVEDP -LHC with severe 2 vessel CAD with subtotalled dominant ostial LCx and high grade Ostial/prox LAD>medical therapy recommended given advanced age and severe LV dysfunction -he volume status appears good by RHC -Diuretics remain on hold due to AKI >> serum creatinine bumped over the last 2 days at 2.34 and 2.43 today related to contrast exposure from calf -I's and O's are incomplete -not a candidate for ACE/ARB/ARNI/MRA/SGLT2i due to AKI -BP stable although became soft yesterday afternoon with systolic in the 90Z -Continue hydralazine 10 mg 3 times daily and carvedilol 3.125 mg twice daily -Decrease Imdur to 15 mg daily -Continue to titrate GDMT as BP allows  2.  Bilateral pleural effusions:  -Repeat chest x-ray showed a stable left basilar opacity and increased basilar opacity on the right concerning for worsening atelectasis or infiltrate with associated effusion -Decubitus films show layering bilateral pleural effusions but small and no thoracentesis indicated at this time. 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

## 2022-02-19 NOTE — Progress Notes (Signed)
Mobility Specialist: Progress Note   02/19/22 1848  Mobility  Activity Ambulated with assistance in hallway  Level of Assistance Minimal assist, patient does 75% or more  Assistive Device Front wheel walker  Distance Ambulated (ft) 400 ft  Activity Response Tolerated well  Mobility Referral Yes  $Mobility charge 1 Mobility   Pre-Mobility on RA: 66 HR, 95% SpO2 During Mobility on 1 L/min [place]: 91% SpO2 Post-Mobility on 1 L/min [place]: 81 HR, 139/69 (90) BP, 95% SpO2  Pt received in the bed and agreeable to mobility. MinA with bed mobility as well as to stand. Ambulated on 1 L/min [place], no c/o throughout. Pt back to bed after session with call bell at his side.   [place] Yuritza [place]hus Mobility Specialist Please contact via SecureChat or Rehab office at [phone]

## 2022-02-19 NOTE — Progress Notes (Signed)
   Heart Failure Stewardship Pharmacist Progress Note   PCP: Kathalene Frames, MD PCP-Cardiologist: Sanda Klein, MD    HPI:  82 yo M with PMH of T2DM, HTN, HLD, PAD, CKD IV, and carotid artery disease.   He presented to the ED on 1/31 with shortness of breath and LE edema. CXR with mild to moderate L and R pleural effusions. ECHO 2/1 showed LVEF 25-30% with global hypokinesis, G2DD, and RV moderately reduced. R/LHC 2/5 showed severe ischemic disease with 2 vessel CAD, recommending medical management. RA 6, PA 37, wedge 12, CO 7.8, CI 3.9.  Current HF Medications: Beta Blocker: carvedilol 3.125 mg BID Other: hydralazine 10 mg TID, Imdur 15 mg daily  Prior to admission HF Medications: Diuretic: torsemide 20 mg daily (patient reports taking every other day) ACE/ARB/ARNI: telmisartan 80 mg daily  Pertinent Lab Values: Serum creatinine 2.43, BUN 38, Potassium 4.1, Sodium 135, BNP 3275.7, A1c 5.6, Magnesium 2.0  Vital Signs: Weight: 197 lbs (admission weight: 228 lbs) Blood pressure: 110/50s  Heart rate: 60s  I/O: incomplete  Medication Assistance / Insurance Benefits Check: Does the patient have prescription insurance?  Yes Type of insurance plan: [employer]  Outpatient Pharmacy:  Prior to admission outpatient pharmacy: [facility] Is the patient willing to use [facility] pharmacy at discharge? Yes    Assessment: 1. Acute systolic CHF (LVEF 84-83%). NYHA class II symptoms. - Off IV lasix. Strict I/Os and daily weights. Keep K>4 and Mg>2. - Continue carvedilol 3.125 mg BID  - Holding irbesartan from AKI. Creatinine worsened. He has h/o cough with ACEI and prior itching and hand swelling with losartan, though was on telimsartan as OP and was tolerating irbesartan here.  - Continue hydralazine 10 mg TID, agree with reducing to Imdur 15 mg daily   Plan: 1) Medication changes recommended at this time: - agree with changes   2) Patient assistance: -  Farxiga/Jardiance copay $0 - Entresto copay $0  3)  Education  - Patient has been educated on current HF medications and potential additions to HF medication regimen - Patient verbalizes understanding that over the next few months, these medication doses may change and more medications may be added to optimize HF regimen - Patient has been educated on basic disease state pathophysiology and goals of therapy   Kerby Nora, PharmD, BCPS Heart Failure Stewardship Pharmacist Phone [phone]

## 2022-02-19 NOTE — Care Management Important Message (Signed)
Important Message  Patient Details  Name: Daniel Warner MRN: [id number] Date of Birth: [birth year]   Medicare Important Message Given:  Yes     Shelda Altes 02/19/2022, 10:22 AM

## 2022-02-19 NOTE — Progress Notes (Signed)
OT Cancellation Note  Patient Details Name: Daniel Warner MRN: [id number] DOB: 07/02/1940   Cancelled Treatment:    Reason Eval/Treat Not Completed: Patient declined, no reason specified Initiated OT session, discussing energy conservation with pt and wife. However, once time to initiate movement, pt declined and said he was not doing anything until after breakfast. Will follow up for OT likely tomorrow.  Layla Maw 02/19/2022, 8:48 AM

## 2022-02-19 NOTE — Progress Notes (Signed)
Progress Note   Patient: Daniel Warner MLY:[id number] DOB: 1940-05-27 DOA: 02/11/2022     7 DOS: the patient was seen and examined on 02/19/2022   Brief hospital course: [name] was admitted to the hospital with the working diagnosis of decompensated heart failure.  82 yo male with the past medical history of heart failure, CKD stage 4, T2DM, and hypertension who presented with dyspnea. Reported worsening dyspnea for 4 weeks, along with lower extremity edema. On the day of admission he was diagnosed with pulmonary edema at a local urgent care and was referred to the ED for further treatment. He has not been compliant with oral diuretic therapy at home. On his initial physical examination his blood pressure was 159/80, HR 85. RR 16 and 02 saturation 95%, lungs with decreased breath sounds at bases, heart with S1 and S2 present and regular, abdomen with no distention and positive +++ lower extremity edema.   Chest radiograph with cardiomegaly, bilateral hilar vascular congestion, cephalization of the vasculature and bilateral interstitial infiltrates. Bilateral pleural effusions, more left than right.  EKG 81 bpm, normal axis, first degree AV block, sinus rhythm with poor R R wave progression with no significant ST segment or T wave changes.   Patient was placed on IV furosemide for diuresis.  Had cardiac catheterization with improvement in filling pressures.  Positive pre capillary pulmonary hypertension.   02/07 volume status has been improving.  02/08 holding furosemide due to rise in serum cr.      Assessment and Plan: * Acute on chronic systolic (congestive) heart failure (HCC) Echocardiogram with reduced LV systolic function EF 25 to 30%, global hypokinesis, RV systolic function with moderate reduction, moderate dilatation LA and RA. RVSP 38,0. No significant valvular disease.   Pulmonary hypertension] Acute on chronic core pulmonale.   02/05 cardiac catheterization  RA  9/8 RV 63/2  PA 62/21 mean 37  PCWP 14 Cardiac output 7,8 and index 3.9   Precapillary pulmonary hypertension.   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   Urine output documented 812 ml  Systolic blood pressure is 110 to 120 mmHg.   Continue medical therapy with carvedilol. After load reduction with hydralazine and isosorbide.  Holding on RAS inh and SGLT 2 inh due to reduced GFR.   Coronary artery disease, continue antiplatelet therapy with aspirin and clopidogrel.  Acute pulmonary edema with bilateral pleural effusions, (acute). Volume status has improved.  Continue oxymetry monitoring.  May need supplemental home 02 for pulmonary hypertension.   Essential hypertension Blood pressure control with carvedilol, hydralazine and isosorbide.   Chronic kidney disease (CKD), stage IV (severe) (HCC) Renal function with serum cr at 2,43 with K at 4,1 and serum bicarbonate at 27, Plan to continue holding diuretic therapy and follow up renal function in am. Avoid hypotension and nephrotoxic medications.   Type 2 diabetes mellitus with hyperlipidemia (HCC) His glucose has been stable,  Hold on insulin therapy for now.         Subjective: Patient is feeling well, dyspnea and edema are improving, no chest pain.   Physical Exam: Vitals:   02/19/22 0558 02/19/22 0838 02/19/22 1045 02/19/22 1613  BP:  (!) 116/55  (!) 110/55  Pulse:  66 72 65  Resp:  17  (!) 2  Temp:  97.7 F (36.5 C)  98.1 F (36.7 C)  TempSrc:  Oral  Oral  SpO2:  91%  96%  Weight: 89.8 kg  Height:       Neurology awake and alert ENT with mild pallor Cardiovascular with S1 and S2 present and rhythmic with no gallops, rubs or murmurs Respiratory with no rales or wheezing Abdomen with no distention  Positive lower extremity edema +., skin discoloration distal legs  Data Reviewed:    Family Communication: no family at the  bedside   Disposition: Status is: Inpatient Remains inpatient appropriate because: pending renal function improvement   Planned Discharge Destination: Home      Author: Tawni Millers, MD 02/19/2022 4:51 PM  For on call review [email or web address].

## 2022-02-19 NOTE — Progress Notes (Signed)
Heart Failure Nurse Navigator Progress Note  PCP: Kathalene Frames, MD PCP-Cardiologist: [place] Admission Diagnosis: Elevated BNP, Shortness of breath Admitted from: Home  Presentation:   Daniel Warner presented with worsening shortness of breath over a month, leg swelling, went to urgent care, CXR showed bilateral pleural effusions. BP 158/74, HR 75, BNP 3,257. IV lasix given. Had R/L heart cath on 2/5 showed severe ischemic disease with 2 vessel CAD, medical management recommended.   Patient and wife were educated on the sign and symptoms of heart failure, daily weights, when to call his doctor or go to the ED. Diet/ fluid restrictions, taking all his medication as prescribed ( patient reported he didn't always take his lasix at night because of having to use the restroom all night long. ) Spoke about attending all medical appointments. Patient and his wife verbalized their understanding of education and a HF TOC appointment was scheduled for 03/10/2022 @ 12 noon.    ECHO/ LVEF: 25-30% G2DD  Clinical Course:  Past Medical History:  Diagnosis Date   Carotid artery disease (HCC)    CKD (chronic kidney disease), stage IV (HCC)    Diabetes mellitus type 2 with peripheral artery disease (HCC)    Discolored skin    Right leg, thinks it's from a bag that has rubbed against his leg for so long. Doppler 12/2010   GERD (gastroesophageal reflux disease)    Hypercholesterolemia    Hyperlipidemia    Numbness of toes    In the morning   Obesity    Peripheral arterial disease (HCC)      Social History   Socioeconomic History   Marital status: Married    Spouse name: Joaquim Lai   Number of children: 1   Years of education: Not on file   Highest education level: Not on file  Occupational History   Occupation: retired  Tobacco Use   Smoking status: Never   Smokeless tobacco: Never  Vaping Use   Vaping Use: Never used  Substance and Sexual Activity   Alcohol use: No   Drug  use: No   Sexual activity: Not on file  Other Topics Concern   Not on file  Social History Narrative   Not on file   Social Determinants of Health   Financial Resource Strain: Low Risk  (02/19/2022)   Overall Financial Resource Strain (CARDIA)    Difficulty of Paying Living Expenses: Not very hard  Food Insecurity: No Food Insecurity (02/14/2022)   Hunger Vital Sign    Worried About Running Out of Food in the Last Year: Never true    [place] in the Last Year: Never true  Transportation Needs: No Transportation Needs (02/19/2022)   PRAPARE - Hydrologist (Medical): No    Lack of Transportation (Non-Medical): No  Physical Activity: Inactive (07/15/2018)   Exercise Vital Sign    Days of Exercise per Week: 0 days    Minutes of Exercise per Session: 0 min  Stress: No Stress Concern Present (07/15/2018)   [place]    Feeling of Stress : Not at all  Social Connections: Not on file   Education Assessment and Provision:  Detailed education and instructions provided on heart failure disease management including the following:  Signs and symptoms of Heart Failure When to call the physician Importance of daily weights Low sodium diet Fluid restriction Medication management Anticipated future follow-up appointments  Patient education given on  each of the above topics.  Patient acknowledges understanding via teach back method and acceptance of all instructions.  Education Materials:  "Living Better With Heart Failure" Booklet, HF zone tool, & Daily Weight Tracker Tool.  Patient has scale at home: yes Patient has pill box at home: yes    High Risk Criteria for Readmission and/or Poor Patient Outcomes: Heart failure hospital admissions (last 6 months): 0  No Show rate: 4% Difficult social situation: No Demonstrates medication adherence: Yes Primary Language: English Literacy level:  Reading, writing, and comprehension  Barriers of Care:   Diet/ fluid restrictions Daily weights Complete medication compliance with Lasix  Considerations/Referrals:   Referral made to Heart Failure Pharmacist Stewardship: yes Referral made to Heart Failure CSW/NCM TOC: No Referral made to Heart & Vascular TOC clinic: Yes, 03/11/2022 @ 12 noon  Items for Follow-up on DC/TOC: Complete medication compliance with lasix Diet/ fluid restrictions/ Daily weights Continued HF education   Earnestine Leys, BSN, RN Heart Failure Transport planner Only

## 2022-02-20 ENCOUNTER — Inpatient Hospital Stay (HOSPITAL_COMMUNITY): Payer: PPO

## 2022-02-20 DIAGNOSIS — N184 Chronic kidney disease, stage 4 (severe): Secondary | ICD-10-CM | POA: Diagnosis not present

## 2022-02-20 DIAGNOSIS — E1169 Type 2 diabetes mellitus with other specified complication: Secondary | ICD-10-CM | POA: Diagnosis not present

## 2022-02-20 DIAGNOSIS — I10 Essential (primary) hypertension: Secondary | ICD-10-CM | POA: Diagnosis not present

## 2022-02-20 DIAGNOSIS — I5023 Acute on chronic systolic (congestive) heart failure: Secondary | ICD-10-CM | POA: Diagnosis not present

## 2022-02-20 LAB — BASIC METABOLIC PANEL
Anion gap: 9 (ref 5–15)
BUN: 38 mg/dL — ABNORMAL HIGH (ref 8–23)
CO2: 27 mmol/L (ref 22–32)
Calcium: 7.9 mg/dL — ABNORMAL LOW (ref 8.9–10.3)
Chloride: 100 mmol/L (ref 98–111)
Creatinine, Ser: 2.43 mg/dL — ABNORMAL HIGH (ref 0.61–1.24)
GFR, Estimated: 26 mL/min — ABNORMAL LOW (ref >60)
Glucose, Bld: 117 mg/dL — ABNORMAL HIGH (ref 70–99)
Potassium: 4.2 mmol/L (ref 3.5–5.1)
Sodium: 136 mmol/L (ref 135–145)

## 2022-02-20 LAB — GLUCOSE, CAPILLARY
Glucose-Capillary: 186 mg/dL — ABNORMAL HIGH (ref 70–99)
Glucose-Capillary: 134 mg/dL — ABNORMAL HIGH (ref 70–99)
Glucose-Capillary: 182 mg/dL — ABNORMAL HIGH (ref 70–99)

## 2022-02-20 LAB — BRAIN NATRIURETIC PEPTIDE: B Natriuretic Peptide: 1464.6 pg/mL — ABNORMAL HIGH (ref 0.0–100.0)

## 2022-02-20 NOTE — Progress Notes (Signed)
   02/20/22 0600  Mobility  HOB Elevated/Bed Position Self regulated  Activity Ambulated with assistance in hallway  Range of Motion/Exercises Active;All extremities  Level of Assistance Standby assist, set-up cues, supervision of patient - no hands on  Assistive Device Front wheel walker  Distance Ambulated (ft) 470 ft  Activity Response Tolerated well, HR 80s- spo2 89-91% during the ambulation on room air. At rest SPO2 93% on room air and 95% with 1 LPM at night time.  Transport method Ambulatory;Wheelchair   Kennyth Lose, RN

## 2022-02-20 NOTE — Progress Notes (Signed)
   Heart Failure Stewardship Pharmacist Progress Note   PCP: Kathalene Frames, MD PCP-Cardiologist: Sanda Klein, MD    HPI:  82 yo M with PMH of T2DM, HTN, HLD, PAD, CKD IV, and carotid artery disease.   He presented to the ED on 1/31 with shortness of breath and LE edema. CXR with mild to moderate L and R pleural effusions. ECHO 2/1 showed LVEF 25-30% with global hypokinesis, G2DD, and RV moderately reduced. R/LHC 2/5 showed severe ischemic disease with 2 vessel CAD, recommending medical management. RA 6, PA 37, wedge 12, CO 7.8, CI 3.9.  Current HF Medications: Beta Blocker: carvedilol 3.125 mg BID Other: hydralazine 10 mg TID, Imdur 15 mg daily  Prior to admission HF Medications: Diuretic: torsemide 20 mg daily (patient reports taking every other day) ACE/ARB/ARNI: telmisartan 80 mg daily  Pertinent Lab Values: Serum creatinine 2.43, BUN 38, Potassium 4.2, Sodium 136, BNP 3275.7>1464.6, A1c 5.6, Magnesium 2.0  Vital Signs: Weight: 197 lbs (admission weight: 228 lbs) Blood pressure: 120-150/60s  Heart rate: 60-70s  I/O: incomplete  Medication Assistance / Insurance Benefits Check: Does the patient have prescription insurance?  Yes Type of insurance plan: [employer]  Outpatient Pharmacy:  Prior to admission outpatient pharmacy: [facility] Is the patient willing to use [facility] pharmacy at discharge? Yes    Assessment: 1. Acute systolic CHF (LVEF 19-62%). NYHA class II symptoms. - Off IV lasix. Strict I/Os and daily weights. Keep K>4 and Mg>2. - Continue carvedilol 3.125 mg BID  - Holding irbesartan from AKI. Creatinine stable today but still elevated. He has h/o cough with ACEI and prior itching and hand swelling with losartan, though was on telimsartan as OP and was tolerating irbesartan here.  - Continue hydralazine 10 mg TID and Imdur 15 mg daily   Plan: 1) Medication changes recommended at this time: - Continue current regimen  2) Patient  assistance: - Farxiga/Jardiance copay $0 - Entresto copay $0  3)  Education  - Patient has been educated on current HF medications and potential additions to HF medication regimen - Patient verbalizes understanding that over the next few months, these medication doses may change and more medications may be added to optimize HF regimen - Patient has been educated on basic disease state pathophysiology and goals of therapy   Kerby Nora, PharmD, BCPS Heart Failure Stewardship Pharmacist Phone [phone]

## 2022-02-20 NOTE — Assessment & Plan Note (Signed)
Calculated BMI 31.8

## 2022-02-20 NOTE — Progress Notes (Signed)
Mobility Specialist Progress Note:   02/20/22 1126  Mobility  Activity Transferred to/from [facility]  Level of Assistance Moderate assist, patient does 50-74%  Assistive Device Other (Comment) (HHA)  Distance Ambulated (ft) 2 ft  Activity Response Tolerated well  Mobility Referral Yes  $Mobility charge 1 Mobility   Pt received in bed and requesting assistance to [facility]. Pt was refusing to wearing Nasal Canula when in bed and required encouragement to put it back on for transfer. Required ModA to stand from bed and CG to transfer to [facility]. No c/o SOB or pain. Pt returned to [facility] with all needs met, call bell in reach, and family in room.   Andrey Campanile Mobility Specialist Please contact via SecureChat or  Rehab office at [phone]

## 2022-02-20 NOTE — Progress Notes (Addendum)
Progress Note   Patient: Daniel Warner SWH:[id number] DOB: 05/13/1940 DOA: 02/11/2022     8 DOS: the patient was seen and examined on 02/20/2022   Brief hospital course: Mr. Witucki was admitted to the hospital with the working diagnosis of decompensated heart failure.  82 yo male with the past medical history of heart failure, CKD stage 4, T2DM, and hypertension who presented with dyspnea. Reported worsening dyspnea for 4 weeks, along with lower extremity edema. On the day of admission he was diagnosed with pulmonary edema at a local urgent care and was referred to the ED for further treatment. He has not been compliant with oral diuretic therapy at home. On his initial physical examination his blood pressure was 159/80, HR 85. RR 16 and 02 saturation 95%, lungs with decreased breath sounds at bases, heart with S1 and S2 present and regular, abdomen with no distention and positive +++ lower extremity edema.   Chest radiograph with cardiomegaly, bilateral hilar vascular congestion, cephalization of the vasculature and bilateral interstitial infiltrates. Bilateral pleural effusions, more left than right.  EKG 81 bpm, normal axis, first degree AV block, sinus rhythm with poor R R wave progression with no significant ST segment or T wave changes.   Patient was placed on IV furosemide for diuresis.  Had cardiac catheterization with improvement in filling pressures.  Positive pre capillary pulmonary hypertension.   02/07 volume status has been improving.  02/08 holding furosemide due to rise in serum cr.  02/09 renal function starting to stabilize.     Assessment and Plan: * Acute on chronic systolic (congestive) heart failure (HCC) Echocardiogram with reduced LV systolic function EF 25 to 30%, global hypokinesis, RV systolic function with moderate reduction, moderate dilatation LA and RA. RVSP 38,0. No significant valvular disease.   Pulmonary hypertension] Acute on chronic core pulmonale.    02/05 cardiac catheterization  RA 9/8 RV 63/2  PA 62/21 mean 37  PCWP 14 Cardiac output 7,8 and index 3.9   Precapillary pulmonary hypertension.   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   Urine output documented 665 ml  Systolic blood pressure is 104 to 120 mmHg.   Continue medical therapy with carvedilol. After load reduction with hydralazine and isosorbide.  Holding on RAS inh and SGLT 2 inh due to reduced GFR.   Coronary artery disease, continue antiplatelet therapy with aspirin and clopidogrel.  Acute pulmonary edema with bilateral pleural effusions, (acute). Volume status has improved.  Continue oxymetry monitoring.  May need supplemental home 02 for pulmonary hypertension.   Essential hypertension Blood pressure control with carvedilol, hydralazine and isosorbide.   Chronic kidney disease (CKD), stage IV (severe) (HCC) Renal function starting to stabilize, with serum cr at 2,43 with K at 4,2 and serum bicarbonate at 27.   Plan to continue holding diuretic therapy and follow up renal function in am. Avoid hypotension and nephrotoxic medications.   Type 2 diabetes mellitus with hyperlipidemia (HCC) His glucose has been stable,  Hold on insulin therapy for now.   Class 1 obesity Calculated BMI 31.8         Subjective: Patient is feeling better, edema and dyspnea have improved, no chest pain   Physical Exam: Vitals:   02/20/22 0600 02/20/22 0638 02/20/22 0924 02/20/22 1146  BP:   (!) 155/69 (!) 104/50  Pulse: 63 66 72 (!) 58  Resp: $Remo'17 16 17 16  'CGMHK$ Temp:   (!) 97.5 F (36.4 C) 98.1  F (36.7 C)  TempSrc:   Axillary Oral  SpO2: 90% (!) 89% 94% 93%  Weight:      Height:       Neurology awake and alert ENT with mild pallor Cardiovascular with S1 and S2 present and rhythmic with no gallops, rubs or murmurs Respiratory with no rales or wheezing, no rhonchi Abdomen with no  distention  Data Reviewed:    Family Communication: I spoke with patient's wife at the bedside, we talked in detail about patient's condition, plan of care and prognosis and all questions were addressed.   Disposition: Status is: Inpatient Remains inpatient appropriate because: pending improvement in renal function possible dc home tomorrow  Planned Discharge Destination: Home      Author: Tawni Millers, MD 02/20/2022 12:39 PM  For on call review [email or web address].

## 2022-02-20 NOTE — Plan of Care (Addendum)
Plan of care is reviewed. Pt has been progressing.   Problem: Activity prior was limited with SOB, HF exacerbation. Goal: Risk for activity intolerance will decrease Outcome: Progressing: Pt has well tolerated ambulating in hall way with PT. Tonight Pt prefers to rest. He will walk with PT at am.   Problem: Elimination: Pt has understanding that he is under fluid restriction due to HF.  CKD stage IV(severe) Goal: Will not experience complications related to bowel motility Outcome: Progressing: Pt had LBM on 02/18/22.   Goal: Will not experience complications related to urinary retention Outcome: Progressing: Pt has been using PremoFit, the external cath due to frequent urination. Pt has dark amber color urine out put.  Monitor kidney function BUN 38, Cr 2.43, GFRe 26. MD aware. Lasix was held.    Problem: Nutrition: Pt has had low appetite. Goal: Adequate nutrition will be maintained Outcome: Progressing: Education and guidance about monitoring fluid intake and output, promote heart healthy diet and emotional support were given.   Problem: Cardiovascular:severe ischemic cardiomyopathy with CAD. From cardiac cath result: Ost LAD to Prox LAD lesion is 90% calcification stenosed. Ost Cx to Prox Cx lesion is 99% stenosed.  EF 25-30%. Pt presented to the hospital on this admission with progressive heart failure.  Goal: Ability to achieve and maintain adequate cardiovascular perfusion will improve Outcome: Progressing: Pt is under medically management. He is clinically hemodynamics stable tonight. Sinus rhythm on the monitor. Monitor daily weight. Pitting edema bilateral legs has been improved after prior diuresis. We will continue to monitor. Kennyth Lose, RN

## 2022-02-20 NOTE — Progress Notes (Addendum)
Rounding Note    Patient Name: Daniel Warner Date of Encounter: 02/20/2022  [place] Cardiologist: Sanda Klein, MD    Subjective   Daniel Warner is a 82 year old gentleman with a history of diabetes mellitus, hyperlipidemia, hypertension, peripheral arterial disease, CKD.  We are asked to see him for further evaluation of congestive heart failure.  Patient has had chronic leg edema.  Echocardiogram from February 12, 2022 reveals severe LV dysfunction with EF of 25 to 30%.  He has grade 2 diastolic dysfunction.  He has moderately reduced RV function.  Pulmonary artery pressures are mildly elevated.  No old echo in the epic system to compare   Cath this admission demonstrated 90% ostial to proximal LAD and 99% ostial LCx and otherwise normal coronary arteries.  RHC showed PAP 62/77mmHg with mean 106mmHg, mRAP 72mmHg, PCWP 68mmHg and LVEDP 17mmHg with CI 3.9.  Given his advanced age and severe LV dysfunction secondary to severe ischemic CM and subtotally occluded dominant LCx and high grade ostial/prox LAD medical therapy was recommended.    Denies chest pain but has some shortness of breath this morning that started after his oxygen was taken off.  O2 sats currently 90% on room air.  Inpatient Medications    Scheduled Meds:  aspirin EC  81 mg Oral Daily   atorvastatin  80 mg Oral q morning   carvedilol  3.125 mg Oral BID WC   clopidogrel  75 mg Oral Q breakfast   enoxaparin (LOVENOX) injection  30 mg Subcutaneous Q24H   hydrALAZINE  10 mg Oral Q8H   isosorbide mononitrate  15 mg Oral Daily   potassium chloride  20 mEq Oral Daily   sodium chloride flush  3 mL Intravenous Q12H   Continuous Infusions:   PRN Meds: acetaminophen, morphine injection, ondansetron (ZOFRAN) IV, mouth rinse, sodium chloride flush   Vital Signs    Vitals:   02/20/22 0500 02/20/22 0546 02/20/22 0600 02/20/22 0638  BP:      Pulse: (!) 58 65 63 66  Resp: $Remo'16 13 17 16  'QDINV$ Temp:       TempSrc:      SpO2: 96% 98% 90% (!) 89%  Weight: 89.5 kg     Height:        Intake/Output Summary (Last 24 hours) at 02/20/2022 0804 Last data filed at 02/20/2022 0530 Gross per 24 hour  Intake 250 ml  Output 450 ml  Net -200 ml       02/20/2022    5:00 AM 02/19/2022    5:58 AM 02/18/2022    4:54 AM  Last 3 Weights  Weight (lbs) 197 lb 4.8 oz 197 lb 14.4 oz 197 lb 4.8 oz  Weight (kg) 89.495 kg 89.767 kg 89.495 kg      Telemetry    NSR- personally Reviewed  ECG     No new EKG to review- Personally Reviewed  Physical Exam   GEN: Well nourished, well developed in no acute distress HEENT: Normal NECK: No JVD; No carotid bruits LYMPHATICS: No lymphadenopathy CARDIAC:RRR, no murmurs, rubs, gallops RESPIRATORY:  Clear to auscultation without rales, wheezing or rhonchi  ABDOMEN: Soft, non-tender, non-distended MUSCULOSKELETAL:  No edema; No deformity  SKIN: Warm and dry NEUROLOGIC:  Alert and oriented x 3 PSYCHIATRIC:  Normal affect  Labs    High Sensitivity Troponin:   Recent Labs  Lab 02/11/22 1756 02/13/22 0926 02/13/22 1142  TROPONINIHS 75* 904* 800*      Chemistry Recent Labs  Lab 02/13/22 0926 02/14/22 0059 02/18/22 1033 02/19/22 0059 02/20/22 0123  NA  --    < > 137 135 136  K  --    < > 4.1 4.1 4.2  CL  --    < > 99 100 100  CO2  --    < > $R'25 27 27  'TE$ GLUCOSE  --    < > 97 109* 117*  BUN  --    < > 35* 38* 38*  CREATININE  --    < > 2.34* 2.43* 2.43*  CALCIUM  --    < > 8.2* 7.7* 7.9*  MG  --   --   --  2.0  --   PROT 5.7*  --   --   --   --   ALBUMIN 2.9*  --   --   --   --   AST 21  --   --   --   --   ALT 13  --   --   --   --   ALKPHOS 109  --   --   --   --   BILITOT 1.0  --   --   --   --   GFRNONAA  --    < > 27* 26* 26*  ANIONGAP  --    < > $R'13 8 9   'HT$ < > = values in this interval not displayed.     Lipids  Recent Labs  Lab 02/14/22 0059  CHOL 104  TRIG 49  HDL 33*  LDLCALC NOT CALCULATED  CHOLHDL 3.2     Hematology Recent  Labs  Lab 02/14/22 0059 02/16/22 0825 02/16/22 0828 02/17/22 0127  WBC 6.3  --   --  7.0  RBC 3.99*  --   --  4.12*  HGB 10.5* 11.9*  11.9* 11.9* 10.8*  HCT 30.9* 35.0*  35.0* 35.0* 31.9*  MCV 77.4*  --   --  77.4*  MCH 26.3  --   --  26.2  MCHC 34.0  --   --  33.9  RDW 16.5*  --   --  16.3*  PLT 233  --   --  234    Thyroid  Recent Labs  Lab 02/14/22 0059  TSH 1.431     BNP No results for input(s): "BNP", "PROBNP" in the last 168 hours.   DDimer No results for input(s): "DDIMER" in the last 168 hours.   Radiology    No results found.  Cardiac Studies    Cardiac Cath 02/16/22 HEMODYNAMICS:    1: Right atrial pressure-9/8, mean of 6 2: Right ventricular pressure-63/2 3: Pulm artery pressure-62/21, mean 37 4: Pulmonary wedge pressure-A-wave 14, V wave 12, mean 12 5: LVEDP-10 6: Cardiac output-7.8 L/min with an index of 3.9 L/min/m   IMPRESSION: Daniel Warner has severe ischemic cardiomyopathy with a subtotally occluded dominant circumflex and high-grade ostial/proximal LAD.  His initial presentation was of heart failure.  He denies chest pain.  Given his age and LV dysfunction I would recommend continued medical therapy at this time.  The radial sheath and antecubital sheath were removed.  A TR band was placed on the right wrist to achieve patent hemostasis.  The patient left lab in stable condition.  Dr. Radford Pax, the patient's attending cardiologist, was notified of these results.  Diagnostic Dominance: Left     Patient Profile     82 y.o. male  with a hx of DM, HTN, HLD, PAD  s/p several prior LE interventions followed by [facility] on Plavix, CKD stage IIIb- IV (baseline Cr 1.66-1.98 in 2023 per [facility]) followed at [facility], carotid artery disease (1-39% BICA in 2018) who is being seen 02/13/2022 for the evaluation of CHF at the request of Dr. Doristine Bosworth.   Assessment & Plan    1.  Acute on chronic combined systolic and diastolic congestive heart failure:   -echo with EF 25 to 30%.   -RHC this admission with normal right sided pressures and left heart cath with PCW/LVEDP -LHC with severe 2 vessel CAD with subtotalled dominant ostial LCx and high grade Ostial/prox LAD>medical therapy recommended given advanced age and severe LV dysfunction -he volume status appears good by RHC -Diuretics remain on hold due to AKI >> serum creatinine bumped since heart cath and has remained at 2.43 for the last 2 days  -Continue to hold Lasix today -I's and O's are incomplete -Complains of shortness of breath this morning but lungs are clear.  This started when his oxygen was removed and now O2 sat 90% on room air.  O2 is being placed back on patient by nursing staff -Chest x-ray and BNP today -not a candidate for ACE/ARB/ARNI/MRA/SGLT2i due to AKI -Became hypotensive on higher dose of Imdur 30 mg daily so this was decreased yesterday to 15 mg daily and blood pressure improved today  -Continue hydralazine 10 mg 3 times daily, carvedilol 3.125 mg twice daily and Imdur 15 mg daily  -Continue to titrate GDMT as BP allows  2.  Bilateral pleural effusions:  -Repeat chest x-ray showed a stable left basilar opacity and increased basilar opacity on the right concerning for worsening atelectasis or infiltrate with associated effusion -Decubitus films show layering bilateral pleural effusions but small and no thoracentesis indicated at this time. -Continue incentive Spirometry and patient needs to get out of bed to chair  3.  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

## 2022-02-21 DIAGNOSIS — I10 Essential (primary) hypertension: Secondary | ICD-10-CM | POA: Diagnosis not present

## 2022-02-21 DIAGNOSIS — E1169 Type 2 diabetes mellitus with other specified complication: Secondary | ICD-10-CM | POA: Diagnosis not present

## 2022-02-21 DIAGNOSIS — N184 Chronic kidney disease, stage 4 (severe): Secondary | ICD-10-CM | POA: Diagnosis not present

## 2022-02-21 DIAGNOSIS — I5023 Acute on chronic systolic (congestive) heart failure: Secondary | ICD-10-CM | POA: Diagnosis not present

## 2022-02-21 DIAGNOSIS — E669 Obesity, unspecified: Secondary | ICD-10-CM

## 2022-02-21 LAB — RENAL FUNCTION PANEL
Albumin: 2.5 g/dL — ABNORMAL LOW (ref 3.5–5.0)
Anion gap: 9 (ref 5–15)
BUN: 37 mg/dL — ABNORMAL HIGH (ref 8–23)
CO2: 26 mmol/L (ref 22–32)
Calcium: 7.9 mg/dL — ABNORMAL LOW (ref 8.9–10.3)
Chloride: 101 mmol/L (ref 98–111)
Creatinine, Ser: 2.34 mg/dL — ABNORMAL HIGH (ref 0.61–1.24)
GFR, Estimated: 27 mL/min — ABNORMAL LOW (ref >60)
Glucose, Bld: 116 mg/dL — ABNORMAL HIGH (ref 70–99)
Phosphorus: 3.8 mg/dL (ref 2.5–4.6)
Potassium: 4.4 mmol/L (ref 3.5–5.1)
Sodium: 136 mmol/L (ref 135–145)

## 2022-02-21 LAB — GLUCOSE, CAPILLARY
Glucose-Capillary: 87 mg/dL (ref 70–99)
Glucose-Capillary: 193 mg/dL — ABNORMAL HIGH (ref 70–99)
Glucose-Capillary: 159 mg/dL — ABNORMAL HIGH (ref 70–99)

## 2022-02-21 MED ORDER — TORSEMIDE 20 MG PO TABS
40.0000 mg | ORAL_TABLET | Freq: Every day | ORAL | Status: DC
  Administered 2022-02-22: 40 mg via ORAL
  Filled 2022-02-21: qty 2

## 2022-02-21 MED ORDER — HYDRALAZINE HCL 10 MG PO TABS
10.0000 mg | ORAL_TABLET | Freq: Two times a day (BID) | ORAL | Status: DC
  Administered 2022-02-21 – 2022-02-22 (×2): 10 mg via ORAL
  Filled 2022-02-21 (×2): qty 1

## 2022-02-21 MED ORDER — FUROSEMIDE 10 MG/ML IJ SOLN
60.0000 mg | Freq: Once | INTRAMUSCULAR | Status: AC
  Administered 2022-02-21: 60 mg via INTRAVENOUS
  Filled 2022-02-21: qty 6

## 2022-02-21 NOTE — Progress Notes (Signed)
Progress Note  Patient Name: Daniel Warner Date of Encounter: 02/21/2022  Primary Cardiologist:   Daniel Klein, MD   Subjective   Breathing is OK but not at baseline.    Inpatient Medications    Scheduled Meds:  aspirin EC  81 mg Oral Daily   atorvastatin  80 mg Oral q morning   carvedilol  3.125 mg Oral BID WC   clopidogrel  75 mg Oral Q breakfast   enoxaparin (LOVENOX) injection  30 mg Subcutaneous Q24H   furosemide  60 mg Intravenous Once   hydrALAZINE  10 mg Oral q12n4p   isosorbide mononitrate  15 mg Oral Daily   sodium chloride flush  3 mL Intravenous Q12H   [START ON 02/22/2022] torsemide  40 mg Oral Daily   Continuous Infusions:  PRN Meds: acetaminophen, ondansetron (ZOFRAN) IV, mouth rinse, sodium chloride flush   Vital Signs    Vitals:   02/21/22 0018 02/21/22 0344 02/21/22 0853 02/21/22 1146  BP: 119/61 116/60 (!) 144/67 (!) 102/49  Pulse: 63 63 72 62  Resp: $Remo'18 16 18 17  'zCbwK$ Temp: (!) 97.5 F (36.4 C) 98.2 F (36.8 C) 98.2 F (36.8 C) 97.7 F (36.5 C)  TempSrc: Oral Oral Oral Oral  SpO2: 93% 93% 93% 96%  Weight:  95.2 kg    Height:        Intake/Output Summary (Last 24 hours) at 02/21/2022 1237 Last data filed at 02/21/2022 1100 Gross per 24 hour  Intake 580 ml  Output 100 ml  Net 480 ml   Filed Weights   02/19/22 0558 02/20/22 0500 02/21/22 0344  Weight: 89.8 kg 89.5 kg 95.2 kg    Telemetry    NSR - Personally Reviewed  ECG    NA - Personally Reviewed  Physical Exam   GEN: No acute distress.   Neck: No  JVD Cardiac: RRR, no murmurs, rubs, or gallops.  Respiratory:     Decreased breath sounds right greater than left base. GI: Soft, nontender, non-distended  MS: No  edema; No deformity. Neuro:  Nonfocal  Psych: Normal affect   Labs    Chemistry Recent Labs  Lab 02/19/22 0059 02/20/22 0123 02/21/22 0109  NA 135 136 136  K 4.1 4.2 4.4  CL 100 100 101  CO2 $Re'27 27 26  'efZ$ GLUCOSE 109* 117* 116*  BUN 38* 38* 37*   CREATININE 2.43* 2.43* 2.34*  CALCIUM 7.7* 7.9* 7.9*  ALBUMIN  --   --  2.5*  GFRNONAA 26* 26* 27*  ANIONGAP $RemoveB'8 9 9     'cDFutHGF$ Hematology Recent Labs  Lab 02/16/22 0825 02/16/22 0828 02/17/22 0127  WBC  --   --  7.0  RBC  --   --  4.12*  HGB 11.9*  11.9* 11.9* 10.8*  HCT 35.0*  35.0* 35.0* 31.9*  MCV  --   --  77.4*  MCH  --   --  26.2  MCHC  --   --  33.9  RDW  --   --  16.3*  PLT  --   --  234    Cardiac EnzymesNo results for input(s): "TROPONINI" in the last 168 hours. No results for input(s): "TROPIPOC" in the last 168 hours.   BNP Recent Labs  Lab 02/20/22 0123  BNP 1,464.6*     DDimer No results for input(s): "DDIMER" in the last 168 hours.   Radiology    DG Chest 2 View  Result Date: 02/20/2022 CLINICAL DATA:  Shortness of breath.  EXAM: CHEST - 2 VIEW COMPARISON:  02/16/2022 FINDINGS: Slightly improved aeration in the lower lungs. There are persistent basilar chest densities, left side greater than right. Pleural effusions appear to be decreased compared to 02/16/2022. Heart size appears to be stable and likely upper limits of normal. Atherosclerotic calcifications at the aortic arch. Negative for a pneumothorax. IMPRESSION: 1. Slightly improved aeration in the lower lungs and findings are suggestive for decreased bilateral pleural effusions. Electronically Signed   By: Daniel Warner M.D.   On: 02/20/2022 09:27    Cardiac Studies     Cardiac Cath 02/16/22 HEMODYNAMICS:    1: Right atrial pressure-9/8, mean of 6 2: Right ventricular pressure-63/2 3: Pulm artery pressure-62/21, mean 37 4: Pulmonary wedge pressure-A-wave 14, V wave 12, mean 12 5: LVEDP-10 6: Cardiac output-7.8 L/min with an index of 3.9 L/min/m   IMPRESSION: [name] has severe ischemic cardiomyopathy with a subtotally occluded dominant circumflex and high-grade ostial/proximal LAD.  His initial presentation was of heart failure.  He denies chest pain.  Given his age and LV dysfunction I would  recommend continued medical therapy at this time.  The radial sheath and antecubital sheath were removed.  A TR band was placed on the right wrist to achieve patent hemostasis.  The patient left lab in stable condition.  Dr. Radford Warner, the patient's attending cardiologist, was notified of these results.   Diagnostic Dominance: Left     Patient Profile     82 y.o. male with a history of diabetes mellitus, hyperlipidemia, hypertension, peripheral arterial disease, CKD. We are asked to see him for further evaluation of congestive heart failure.   Assessment & Plan    Acute on chronic combined systolic and diastolic congestive heart failure:   Held IV Lasix last few days with AKI.  Meds also limited by Low BPs.   Net negative 12.2 liters.   Creat is drifting down.  Agree that it is OK to try Lasix again today.       Bilateral pleural effusions:    Small without indication for thoracentesis.     ASCAD:   Continue medical management.  No overt ischemic symptoms.    For questions or updates, please contact [employer] Please consult [email or web address] for contact info under Cardiology/STEMI.   Signed, Daniel Breeding, MD  02/21/2022, 12:37 PM

## 2022-02-21 NOTE — Progress Notes (Signed)
Progress Note   Patient: Daniel Warner HYW:[id number] DOB: 12/09/1940 DOA: 02/11/2022     9 DOS: the patient was seen and examined on 02/21/2022   Brief hospital course: Daniel Warner was admitted to the hospital with the working diagnosis of decompensated heart failure.  82 yo male with the past medical history of heart failure, CKD stage 4, T2DM, and hypertension who presented with dyspnea. Reported worsening dyspnea for 4 weeks, along with lower extremity edema. On the day of admission he was diagnosed with pulmonary edema at a local urgent care and was referred to the ED for further treatment. He has not been compliant with oral diuretic therapy at home. On his initial physical examination his blood pressure was 159/80, HR 85. RR 16 and 02 saturation 95%, lungs with decreased breath sounds at bases, heart with S1 and S2 present and regular, abdomen with no distention and positive +++ lower extremity edema.   Na 138, K 4,4 CL 105 bicarbonate 25, glucose 112, bun 17 cr 1,71  BMP 3,275 ml.  High sensitive troponin 72, 904, 800.  Wbc 6.5 hgb 11.4 plt 231.   Chest radiograph with cardiomegaly, bilateral hilar vascular congestion, cephalization of the vasculature and bilateral interstitial infiltrates. Bilateral pleural effusions, more left than right.  EKG 81 bpm, normal axis, first degree AV block, sinus rhythm with poor R R wave progression with no significant ST segment or T wave changes.   Patient was placed on IV furosemide for diuresis.  Had cardiac catheterization with improvement in filling pressures.  Positive pre capillary pulmonary hypertension.   02/07 volume status has been improving.  02/08 holding furosemide due to rise in serum cr.  02/09 renal function starting to stabilize.  02/10 continue to improve volume status, plan to continue with oral diuretics and follow up as outpatient.     Assessment and Plan: * Acute on chronic systolic (congestive) heart failure  (HCC) Echocardiogram with reduced LV systolic function EF 25 to 30%, global hypokinesis, RV systolic function with moderate reduction, moderate dilatation LA and RA. RVSP 38,0. No significant valvular disease.   Pulmonary hypertension] Acute on chronic core pulmonale.   02/05 cardiac catheterization  RA 9/8 RV 63/2  PA 62/21 mean 37  PCWP 14 Cardiac output 7,8 and index 3.9   Precapillary pulmonary hypertension.   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   Patient was placed on IV furosemide, negative fluid balance was achieved, - 12,673 ml with significant improvement in his symptoms. His diuretic therapy has been on hold due to high serum cr. Today with recurrent edema, will add one dose of IV furosemide and ted hose.  Possible change to po loop diuretic therapy tomorrow. Continue medical therapy with carvedilol. After load reduction with hydralazine and isosorbide.  Holding on RAS inh and SGLT 2 inh due to reduced GFR.   Coronary artery disease, continue antiplatelet therapy with aspirin and clopidogrel.  Acute pulmonary edema with bilateral pleural effusions, (acute). Volume status has improved.  Continue oxymetry monitoring.  Check ambulatory 02 saturation on room air before his discharge.   Essential hypertension Blood pressure control with carvedilol, hydralazine and isosorbide.   Chronic kidney disease (CKD), stage IV (severe) (HCC) AKI Peak serum cr was 2,43, at the time of his discharge renal function is improving to 2,34 with K at 4,4 and serum bicarbonate at 26.   Plan to resume IV furosemide for diuresis.  Hold on RAS inhibition for  now until GFR more stable.   Type 2 diabetes mellitus with hyperlipidemia (HCC) His glucose has remained stable. He has been off insulin therapy.  Continue statin therapy.   Class 1 obesity Calculated BMI 31.8         Subjective: Patient today with  worsening lower extremity edema. No chest pain or dyspnea,   Physical Exam: Vitals:   02/20/22 2001 02/21/22 0018 02/21/22 0344 02/21/22 0853  BP: (!) 101/52 119/61 116/60 (!) 144/67  Pulse: 66 63 63 72  Resp: $Remo'17 18 16 18  'omdiM$ Temp: 98.5 F (36.9 C) (!) 97.5 F (36.4 C) 98.2 F (36.8 C) 98.2 F (36.8 C)  TempSrc: Oral Oral Oral Oral  SpO2: 91% 93% 93% 93%  Weight:   95.2 kg   Height:       Neurology awake and alert ENT with mild pallor Cardiovascular with S1 and S2 present and rhythmic with no gallops or murmurs No JVD Positive lower extremity edema ++ pitting Respiratory with no rales or wheezing Abdomen with no distention  Data Reviewed:    Family Communication: I spoke with patient's wife at the bedside, we talked in detail about patient's condition, plan of care and prognosis and all questions were addressed.   Disposition: Status is: Inpatient Remains inpatient appropriate because: IV diuresis   Planned Discharge Destination: Home     Author: Tawni Millers, MD 02/21/2022 11:06 AM  For on call review [email or web address].

## 2022-02-21 NOTE — Progress Notes (Signed)
Mobility Specialist Progress Note:   02/21/22 1359  Mobility  Activity Ambulated with assistance in hallway  Level of Assistance Standby assist, set-up cues, supervision of patient - no hands on  Assistive Device Front wheel walker  Distance Ambulated (ft) 400 ft  Activity Response Tolerated well  Mobility Referral Yes  $Mobility charge 1 Mobility   Pt received in bed and agreeable. C/o fatigue at beginning of session d/t recent nap. Pt required max cues to keep walker on ground for stability. Pt stated that the walker was "slowing him down" and that he didn't require it. Pt was unsteady when picking up walker. Returned pt to bed with all needs met, call bell in reach, and bed alarm on.   Andrey Campanile Mobility Specialist Please contact via SecureChat or  Rehab office at [phone]

## 2022-02-22 DIAGNOSIS — N184 Chronic kidney disease, stage 4 (severe): Secondary | ICD-10-CM | POA: Diagnosis not present

## 2022-02-22 DIAGNOSIS — I10 Essential (primary) hypertension: Secondary | ICD-10-CM | POA: Diagnosis not present

## 2022-02-22 DIAGNOSIS — I5023 Acute on chronic systolic (congestive) heart failure: Secondary | ICD-10-CM | POA: Diagnosis not present

## 2022-02-22 DIAGNOSIS — E1169 Type 2 diabetes mellitus with other specified complication: Secondary | ICD-10-CM | POA: Diagnosis not present

## 2022-02-22 LAB — GLUCOSE, CAPILLARY
Glucose-Capillary: 183 mg/dL — ABNORMAL HIGH (ref 70–99)
Glucose-Capillary: 86 mg/dL (ref 70–99)
Glucose-Capillary: 184 mg/dL — ABNORMAL HIGH (ref 70–99)
Glucose-Capillary: 154 mg/dL — ABNORMAL HIGH (ref 70–99)

## 2022-02-22 LAB — BASIC METABOLIC PANEL
Anion gap: 7 (ref 5–15)
BUN: 35 mg/dL — ABNORMAL HIGH (ref 8–23)
CO2: 28 mmol/L (ref 22–32)
Calcium: 8 mg/dL — ABNORMAL LOW (ref 8.9–10.3)
Chloride: 102 mmol/L (ref 98–111)
Creatinine, Ser: 2.28 mg/dL — ABNORMAL HIGH (ref 0.61–1.24)
GFR, Estimated: 28 mL/min — ABNORMAL LOW (ref >60)
Glucose, Bld: 89 mg/dL (ref 70–99)
Potassium: 4.4 mmol/L (ref 3.5–5.1)
Sodium: 137 mmol/L (ref 135–145)

## 2022-02-22 MED ORDER — TORSEMIDE 40 MG PO TABS: 40.0000 mg | ORAL_TABLET | Freq: Every day | ORAL | 0 refills | Status: DC

## 2022-02-22 MED ORDER — ASPIRIN 81 MG PO TBEC: 81.0000 mg | DELAYED_RELEASE_TABLET | Freq: Every day | ORAL | 12 refills | Status: AC

## 2022-02-22 MED ORDER — HYDRALAZINE HCL 10 MG PO TABS: 10.0000 mg | ORAL_TABLET | Freq: Two times a day (BID) | ORAL | 0 refills | Status: AC

## 2022-02-22 MED ORDER — ISOSORBIDE MONONITRATE ER 30 MG PO TB24: 15.0000 mg | ORAL_TABLET | Freq: Every day | ORAL | 0 refills | Status: AC

## 2022-02-22 MED ORDER — CARVEDILOL 3.125 MG PO TABS: 3.1250 mg | ORAL_TABLET | Freq: Two times a day (BID) | ORAL | 0 refills | Status: AC

## 2022-02-22 NOTE — Progress Notes (Signed)
Pt resting on RA 98%.  Pt ambulating on RA 94%-99%.  Pt does not need home O2.

## 2022-02-22 NOTE — TOC Transition Note (Signed)
Transition of Care [facility]) - CM/SW Discharge Note   Patient Details  Name: Daniel Warner MRN: [id number] Date of Birth: October 05, 1940  [facility] CM/SW Contact:  Maebelle Munroe, RN Phone Number: 02/22/2022, 4:30 PM   Clinical Narrative: [facility] team for discharge planning. Spoke to patient via telephone. His current need is for outpatient PT. Referral completed for PT. Pt in agreement with post discharge services. Patient shares sister (at bedside) will transport patient home. He voices plan to drive himself to post discharge visits. Advised him to check with the doctor before resuming driving. He shares he can arrange for transportation if necessary. No further needs noted.     Final next level of care: Home/Self Care Barriers to Discharge: No Barriers Identified   Patient Goals and CMS Choice CMS Medicare.gov Compare Post Acute Care list provided to:: Patient Choice offered to / list presented to : Patient  Discharge Placement                         Discharge Plan and Services Additional resources added to the After Visit Summary for     Discharge Planning Services: CM Consult Post Acute Care Choice: Durable Medical Equipment          DME Arranged: N/A DME Agency: NA Date DME Agency Contacted: 02/17/22 Time DME Agency Contacted: [phone] Representative spoke with at DME Agency: [facility] Arranged: NA [place] Agency: NA        Social Determinants of Health ([place]) Interventions SDOH Screenings   Food Insecurity: No Food Insecurity (02/14/2022)  Housing: Low Risk  (02/19/2022)  Transportation Needs: No Transportation Needs (02/19/2022)  Utilities: Not At Risk (02/14/2022)  Alcohol Screen: Low Risk  (02/19/2022)  Depression (PHQ2-9): Low Risk  (07/04/2019)  Financial Resource Strain: Low Risk  (02/19/2022)  Physical Activity: Inactive (07/15/2018)  Stress: No Stress Concern Present (07/15/2018)  Tobacco Use: Low Risk  (02/19/2022)     Readmission Risk  Interventions     No data to display

## 2022-02-22 NOTE — Progress Notes (Signed)
This RN assisting with pt's discharge. Pt's pharmacy does not open until tomorrow. Next dose of hydralazine (new med) due this evening; too soon to give evening dose now since last received at 1314. Confirmed with Dr. Cathlean Sauer that it is ok for patient to wait until tomorrow morning after getting Rx from pharmacy for next dose of hydralazine.   Reviewed written AVS including med list with pt and wife. Both verbalized understanding and aware all meds for today have been given and to get new meds from pharmacy tomorrow.  CM sent referral for outpt PT.  Noted OT eval not completed. MD and CM notified. Per CM at outpatient PT appt they can do an OT eval there if necessary and get the order from the PCP. Notified pt and wife of this. Offered to have hospital OT come see pt now before leaving but pt prefers to go home now and be seen at outpt PT.   Per Dr. Cathlean Sauer instructed pt not to drive until doctor clears him for driving at follow up appointment. Pt verbalized understanding.  Wife assisted pt to get dressed. Taken to car via w/c. All belongings and new BSC and walker placed in car. Sister here to drive pt and wife home.

## 2022-02-22 NOTE — Progress Notes (Signed)
Progress Note  Patient Name: Daniel Warner Date of Encounter: 02/22/2022  Primary Cardiologist:   Sanda Klein, MD   Subjective   Breathing is improved.  Currently 94% on room air.  No pain.    Inpatient Medications    Scheduled Meds:  aspirin EC  81 mg Oral Daily   atorvastatin  80 mg Oral q morning   carvedilol  3.125 mg Oral BID WC   clopidogrel  75 mg Oral Q breakfast   enoxaparin (LOVENOX) injection  30 mg Subcutaneous Q24H   hydrALAZINE  10 mg Oral q12n4p   isosorbide mononitrate  15 mg Oral Daily   sodium chloride flush  3 mL Intravenous Q12H   torsemide  40 mg Oral Daily   Continuous Infusions:  PRN Meds: acetaminophen, ondansetron (ZOFRAN) IV, mouth rinse, sodium chloride flush   Vital Signs    Vitals:   02/21/22 1931 02/21/22 2335 02/22/22 0601 02/22/22 0700  BP: (!) 124/58 (!) 122/56  128/60  Pulse: 71 62  60  Resp: $Remo'20 16  15  'MdRio$ Temp:  98 F (36.7 C)  97.7 F (36.5 C)  TempSrc:  Oral  Oral  SpO2:  90%  94%  Weight:   91 kg   Height:        Intake/Output Summary (Last 24 hours) at 02/22/2022 1054 Last data filed at 02/21/2022 2138 Gross per 24 hour  Intake 480 ml  Output 1800 ml  Net -1320 ml   Filed Weights   02/20/22 0500 02/21/22 0344 02/22/22 0601  Weight: 89.5 kg 95.2 kg 91 kg    Telemetry    NSR - Personally Reviewed  ECG    NA - Personally Reviewed  Physical Exam   GEN: No  acute distress.   Neck: No  JVD Cardiac: RRR, no murmurs, rubs, or gallops.  Respiratory: Clear   to auscultation bilaterally. GI: Soft, nontender, non-distended, normal bowel sounds  MS:  No edema; No deformity. Neuro:   Nonfocal  Psych: Oriented and appropriate   Labs    Chemistry Recent Labs  Lab 02/20/22 0123 02/21/22 0109 02/22/22 0118  NA 136 136 137  K 4.2 4.4 4.4  CL 100 101 102  CO2 $Re'27 26 28  'ARw$ GLUCOSE 117* 116* 89  BUN 38* 37* 35*  CREATININE 2.43* 2.34* 2.28*  CALCIUM 7.9* 7.9* 8.0*  ALBUMIN  --  2.5*  --   GFRNONAA 26*  27* 28*  ANIONGAP $RemoveB'9 9 7     'oLEDRPkb$ Hematology Recent Labs  Lab 02/16/22 0825 02/16/22 0828 02/17/22 0127  WBC  --   --  7.0  RBC  --   --  4.12*  HGB 11.9*  11.9* 11.9* 10.8*  HCT 35.0*  35.0* 35.0* 31.9*  MCV  --   --  77.4*  MCH  --   --  26.2  MCHC  --   --  33.9  RDW  --   --  16.3*  PLT  --   --  234    Cardiac EnzymesNo results for input(s): "TROPONINI" in the last 168 hours. No results for input(s): "TROPIPOC" in the last 168 hours.   BNP Recent Labs  Lab 02/20/22 0123  BNP 1,464.6*     DDimer No results for input(s): "DDIMER" in the last 168 hours.   Radiology    No results found.  Cardiac Studies     Cardiac Cath 02/16/22 HEMODYNAMICS:    1: Right atrial pressure-9/8, mean of 6 2: Right ventricular  pressure-63/2 3: Pulm artery pressure-62/21, mean 37 4: Pulmonary wedge pressure-A-wave 14, V wave 12, mean 12 5: LVEDP-10 6: Cardiac output-7.8 L/min with an index of 3.9 L/min/m   IMPRESSION: Mr. Drost has severe ischemic cardiomyopathy with a subtotally occluded dominant circumflex and high-grade ostial/proximal LAD.  His initial presentation was of heart failure.  He denies chest pain.  Given his age and LV dysfunction I would recommend continued medical therapy at this time.  The radial sheath and antecubital sheath were removed.  A TR band was placed on the right wrist to achieve patent hemostasis.  The patient left lab in stable condition.  Dr. Radford Pax, the patient's attending cardiologist, was notified of these results.   Diagnostic Dominance: Left     Patient Profile     82 y.o. male with a history of diabetes mellitus, hyperlipidemia, hypertension, peripheral arterial disease, CKD. We are asked to see him for further evaluation of congestive heart failure.   Assessment & Plan    Acute on chronic combined systolic and diastolic congestive heart failure:   Held IV Lasix for a few days with AKI.  Did get Lasix yesterday.  No on oral Torsemide. Net  negative 14  liters.   Creat is still drifting down.     Bilateral pleural effusions:    Small without indication for thoracentesis.     ASCAD:   Continue medical management.  No overt ischemic symptoms.   Disposition:  OK to go home with meds as on MAR.  Follow up has been arranged.   For questions or updates, please contact [place] Please consult [email or web address] for contact info under Cardiology/STEMI.   Signed, Minus Breeding, MD  02/22/2022, 10:54 AM

## 2022-02-22 NOTE — Discharge Summary (Addendum)
Physician Discharge Summary   Patient: Daniel Warner MRN: [id number] DOB: 1940/06/14  Admit date:     02/11/2022  Discharge date: 02/22/22  Discharge Physician: Jimmy Picket Miki Labuda   PCP: Kathalene Frames, MD   Recommendations at discharge:   Plan to continue diuresis with torsemide 40 mg daily. Added hydralazine for after load reduction. Follow up renal function in 7 days. Follow up with Dr Koleen Nimrod in 7 to 10 days Follow up with Cardiology as scheduled.    I spoke with patient's wife at the bedside, we talked in detail about patient's condition, plan of care and prognosis and all questions were addressed.   Discharge Diagnoses: Principal Problem:   Acute on chronic systolic (congestive) heart failure (HCC) Active Problems:   Essential hypertension   Chronic kidney disease (CKD), stage IV (severe) (HCC)   Type 2 diabetes mellitus with hyperlipidemia (HCC)   Class 1 obesity  Resolved Problems:   * No resolved hospital problems. [facility] Course: Daniel Warner was admitted to the hospital with the working diagnosis of decompensated heart failure.  82 yo male with the past medical history of heart failure, CKD stage 4, T2DM, and hypertension who presented with dyspnea. Reported worsening dyspnea for 4 weeks, along with lower extremity edema. On the day of admission he was diagnosed with pulmonary edema at a local urgent care and was referred to the ED for further treatment. He has not been compliant with oral diuretic therapy at home. On his initial physical examination his blood pressure was 159/80, HR 85. RR 16 and 02 saturation 95%, lungs with decreased breath sounds at bases, heart with S1 and S2 present and regular, abdomen with no distention and positive +++ lower extremity edema.   Na 138, K 4,4 CL 105 bicarbonate 25, glucose 112, bun 17 cr 1,71  BMP 3,275 ml.  High sensitive troponin 72, 904, 800.  Wbc 6.5 hgb 11.4 plt 231.   Chest radiograph with  cardiomegaly, bilateral hilar vascular congestion, cephalization of the vasculature and bilateral interstitial infiltrates. Bilateral pleural effusions, more left than right.  EKG 81 bpm, normal axis, first degree AV block, sinus rhythm with poor R R wave progression with no significant ST segment or T wave changes.   Patient was placed on IV furosemide for diuresis.  Had cardiac catheterization with improvement in filling pressures.  Positive pre capillary pulmonary hypertension.   02/07 volume status has been improving.  02/08 holding furosemide due to rise in serum cr.  02/09 renal function starting to stabilize.  02/10 continue to improve volume status, plan to continue with oral diuretics and follow up as outpatient.  02/11 continue to improve volume status and follow up as outpatient.     Assessment and Plan: * Acute on chronic systolic (congestive) heart failure (HCC) Echocardiogram with reduced LV systolic function EF 25 to 30%, global hypokinesis, RV systolic function with moderate reduction, moderate dilatation LA and RA. RVSP 38,0. No significant valvular disease.   Pulmonary hypertension] Acute on chronic core pulmonale.   02/05 cardiac catheterization  RA 9/8 RV 63/2  PA 62/21 mean 37  PCWP 14 Cardiac output 7,8 and index 3.9   Precapillary pulmonary hypertension.   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   Patient was placed on IV furosemide, negative fluid balance was achieved, - 13,993 ml with significant improvement in his symptoms.  Continue medical therapy with carvedilol. After load reduction with hydralazine and isosorbide.  Holding on RAS inh and SGLT 2 inh due to reduced GFR.  Continue diuresis with torsemide po. Ted hose as tolerated at home.   Coronary artery disease, continue antiplatelet therapy with aspirin and clopidogrel.  Acute pulmonary edema with bilateral pleural  effusions, (acute). Volume status has improved.  At the time of his discharge his 02 saturation on room air is 97%  Essential hypertension Blood pressure control with carvedilol, hydralazine and isosorbide.   Chronic kidney disease (CKD), stage IV (severe) ([place]) AKI   Patient tolerated well diuresis, his peaked serum cr was 2.43, at the time of his discharge his renal function has a serum cr of 2,28 with Na 137, bicarbonate 28.  Plan to continue follow up renal function as outpatient.   Type 2 diabetes mellitus with hyperlipidemia (HCC) His glucose has remained stable. He has been off insulin therapy.  Continue statin therapy.   Class 1 obesity Calculated BMI 31.8          Consultants: cardiology  Procedures performed: cardiac catheterization   Disposition: Home Diet recommendation:  Discharge Diet Orders (From admission, onward)     Start     Ordered   02/22/22 0000  Diet - low sodium heart healthy        02/22/22 1134           Cardiac and Carb modified diet DISCHARGE MEDICATION: Allergies as of 02/22/2022       Reactions   Losartan Potassium    Other reaction(s): itching and hand swelling   Lisinopril    Other reaction(s): Cough        Medication List     STOP taking these medications    amLODipine 5 MG tablet Commonly known as: NORVASC   telmisartan 80 MG tablet Commonly known as: MICARDIS       TAKE these medications    aspirin EC 81 MG tablet Take 1 tablet (81 mg total) by mouth daily. Swallow whole.   atorvastatin 80 MG tablet Commonly known as: LIPITOR Take 80 mg by mouth every morning.   calcitRIOL 0.25 MCG capsule Commonly known as: ROCALTROL Take 0.25 mcg by mouth daily.   carvedilol 3.125 MG tablet Commonly known as: COREG Take 1 tablet (3.125 mg total) by mouth 2 (two) times daily with a meal.   clopidogrel 75 MG tablet Commonly known as: PLAVIX Take 1 tablet (75 mg total) by mouth daily with breakfast.   FreeStyle  Libre 14 Day Reader [facility] 14 Day Sensor Misc   hydrALAZINE 10 MG tablet Commonly known as: APRESOLINE Take 1 tablet (10 mg total) by mouth in the morning and at bedtime.   isosorbide mononitrate 30 MG 24 hr tablet Commonly known as: IMDUR Take 0.5 tablets (15 mg total) by mouth daily.   Torsemide 40 MG Tabs Take 40 mg by mouth daily. Start taking on: February 23, 2022 What changed:  medication strength how much to take   Trulicity 1.5 QV/9.5GL Sopn Generic drug: Dulaglutide Inject 1.5 mg into the skin once a week. Saturday or Sunday               Durable Medical Equipment  (From admission, onward)           Start     Ordered   02/17/22 1504  For home use only DME Walker rolling  Once       Question Answer Comment  Walker: With 5 Inch Wheels   Patient needs a walker to treat  with the following condition Weakness generalized      02/17/22 1503   02/17/22 1503  For home use only DME Bedside commode  Once       Question:  Patient needs a bedside commode to treat with the following condition  Answer:  Weakness generalized   02/17/22 [place] Follow up.   Why: Rolling walker and Bedside Commode arranged- to be delivered to room prior to discharge Contact information: [place] [address] [phone]         [facility]. Go to.   Specialty: Cardiology Why: Hospital follow up 03/11/2022 @ 12 noon PLEASE bring a current medication list to appointment FREE valet parking, Entrance C, off [employer] information: [address] [id number] [place] [phone]               Discharge Exam: Filed Weights   02/20/22 0500 02/21/22 0344 02/22/22 0601  Weight: 89.5 kg 95.2 kg 91 kg   BP 128/60 (BP Location: Right Arm)   Pulse 60   Temp 97.7 F (36.5 C) (Oral)    Resp 15   Ht $R'5\' 6"'cd$  (1.676 m)   Wt 91 kg   SpO2 94%   BMI 32.38 kg/m   Patient is feeling better, no dyspnea and edema has improved  Neurology awake and alert ENT with mild pallor Cardiovascular with S1 and S2 present and rhythmic, with no gallops, rubs or murmurs. No JVD Lower extremity with trace edema Respiratory with no rales or wheezing Abdomen with no distention   Condition at discharge: stable  The results of significant diagnostics from this hospitalization (including imaging, microbiology, ancillary and laboratory) are listed below for reference.   Imaging Studies: DG Chest 2 View  Result Date: 02/20/2022 CLINICAL DATA:  Shortness of breath. EXAM: CHEST - 2 VIEW COMPARISON:  02/16/2022 FINDINGS: Slightly improved aeration in the lower lungs. There are persistent basilar chest densities, left side greater than right. Pleural effusions appear to be decreased compared to 02/16/2022. Heart size appears to be stable and likely upper limits of normal. Atherosclerotic calcifications at the aortic arch. Negative for a pneumothorax. IMPRESSION: 1. Slightly improved aeration in the lower lungs and findings are suggestive for decreased bilateral pleural effusions. Electronically Signed   By: Markus Daft M.D.   On: 02/20/2022 09:27   DG Chest 2 View  Result Date: 02/16/2022 CLINICAL DATA:  Pleural effusion. EXAM: CHEST - 2 VIEW COMPARISON:  February 11, 2022. FINDINGS: Stable cardiomediastinal silhouette. Stable left pleural effusion is noted with associated left basilar atelectasis or infiltrate. Mildly increased right basilar opacity is noted concerning for worsening atelectasis or infiltrate with associated effusion. Bony thorax is unremarkable. IMPRESSION: Stable left basilar opacity as described above. Increased right basilar opacity is noted concerning for worsening atelectasis or infiltrate with associated effusion. Aortic Atherosclerosis (ICD10-I70.0). Electronically Signed   By: Marijo Conception M.D.   On: 02/16/2022 11:54   DG Chest Bilateral Decubitus  Result Date: 02/16/2022 CLINICAL DATA:  Pleural effusions. EXAM: CHEST - BILATERAL DECUBITUS VIEW COMPARISON:  Chest radiograph the same day 02/16/2022. FINDINGS: Layering bilateral pleural effusions. IMPRESSION: Layering bilateral pleural effusions. Two view chest radiograph dictated separately. Electronically Signed   By: Lorin Picket M.D.   On: 02/16/2022 11:52   CARDIAC CATHETERIZATION  Result Date: 02/16/2022 Images from  the original result were not included.   Ost Cx to Prox Cx lesion is 99% stenosed.   Ost LAD to Prox LAD lesion is 90% stenosed. Daniel Warner is a 82 y.o. male  [id number] LOCATION:  FACILITY: [facility] PHYSICIAN: Quay Burow, M.D. 10/14/1940 DATE OF PROCEDURE:  02/16/2022 DATE OF DISCHARGE: CARDIAC CATHETERIZATION History obtained from chart review.Daniel Warner is a 82 y.o. male with a hx of DM, HTN, HLD, PAD s/p several prior LE interventions followed by [facility] on Plavix, CKD stage IIIb- IV (baseline Cr 1.66-1.98 in 2023 per [facility]) followed at [facility], carotid artery disease (1-39% BICA in 2018) who is being seen 02/13/2022 for the evaluation of CHF at the request of Dr. Doristine Bosworth.  He has been diuresed 11 L.  His troponins were not mildly.  His 2D echo revealed severe LV dysfunction without valvular abnormalities.  He was referred for right left heart cath to define his anatomy and physiology. HEMODYNAMICS: \ 1: Right atrial pressure-9/8, mean of 6 2: Right ventricular pressure-63/2 3: Pulm artery pressure-62/21, mean 37 4: Pulmonary wedge pressure-A-wave 14, V wave 12, mean 12 5: LVEDP-10 6: Cardiac output-7.8 L/min with an index of 3.9 L/min/m    Mr. Ballow has severe ischemic cardiomyopathy with a subtotally occluded dominant circumflex and high-grade ostial/proximal LAD.  His initial presentation was of heart failure.  He denies chest pain.  Given his age and LV dysfunction I would recommend  continued medical therapy at this time.  The radial sheath and antecubital sheath were removed.  A TR band was placed on the right wrist to achieve patent hemostasis.  The patient left lab in stable condition.  Dr. Radford Pax, the patient's attending cardiologist, was notified of these results. Quay Burow. MD, [facility] 02/16/2022 8:56 AM    ECHOCARDIOGRAM COMPLETE  Result Date: 02/12/2022    ECHOCARDIOGRAM REPORT   Patient Name:   Daniel Warner Date of Exam: 02/12/2022 Medical Rec #:  [id number]         Height:       66.0 in Accession #:    [id number]        Weight:       228.0 lb Date of Birth:  [birth year]         BSA:          2.114 m Patient Age:    40 years          BP:           142/79 mmHg Patient Gender: M                 HR:           72 bpm. Exam Location:  Inpatient Procedure: 2D Echo, Cardiac Doppler, Color Doppler and Intracardiac            Opacification Agent Indications:    acute diastolic chf  History:        Patient has no prior history of Echocardiogram examinations.                 CHF, chronic kidney disease; Risk Factors:Hypertension,                 Dyslipidemia, Diabetes and Sleep Apnea.  Sonographer:    Johny Chess RDCS Referring Phys: [id number] JARED M GARDNER  Sonographer Comments: Image acquisition challenging due to patient body habitus. IMPRESSIONS  1. Left ventricular ejection fraction, by estimation, is 25 to 30%. The left ventricle has severely decreased function.  The left ventricle demonstrates global hypokinesis. Left ventricular diastolic parameters are consistent with Grade II diastolic dysfunction (pseudonormalization).  2. Right ventricular systolic function is moderately reduced. The right ventricular size is normal. There is mildly elevated pulmonary artery systolic pressure.  3. Left atrial size was moderately dilated.  4. Right atrial size was moderately dilated.  5. The mitral valve is normal in structure. Trivial mitral valve regurgitation. No evidence of mitral stenosis.   6. The aortic valve is tricuspid. There is mild calcification of the aortic valve. Aortic valve regurgitation is not visualized. Aortic valve sclerosis/calcification is present, without any evidence of aortic stenosis.  7. The inferior vena cava is dilated in size with <50% respiratory variability, suggesting right atrial pressure of 15 mmHg. FINDINGS  Left Ventricle: Left ventricular ejection fraction, by estimation, is 25 to 30%. The left ventricle has severely decreased function. The left ventricle demonstrates global hypokinesis. Definity contrast agent was given IV to delineate the left ventricular endocardial borders. The left ventricular internal cavity size was normal in size. There is no left ventricular hypertrophy. Left ventricular diastolic parameters are consistent with Grade II diastolic dysfunction (pseudonormalization). Right Ventricle: The right ventricular size is normal. No increase in right ventricular wall thickness. Right ventricular systolic function is moderately reduced. There is mildly elevated pulmonary artery systolic pressure. The tricuspid regurgitant velocity is 2.74 m/s, and with an assumed right atrial pressure of 8 mmHg, the estimated right ventricular systolic pressure is 07.6 mmHg. Left Atrium: Left atrial size was moderately dilated. Right Atrium: Right atrial size was moderately dilated. Pericardium: There is no evidence of pericardial effusion. Mitral Valve: The mitral valve is normal in structure. Trivial mitral valve regurgitation. No evidence of mitral valve stenosis. Tricuspid Valve: The tricuspid valve is normal in structure. Tricuspid valve regurgitation is not demonstrated. No evidence of tricuspid stenosis. Aortic Valve: The aortic valve is tricuspid. There is mild calcification of the aortic valve. Aortic valve regurgitation is not visualized. Aortic valve sclerosis/calcification is present, without any evidence of aortic stenosis. Pulmonic Valve: The pulmonic valve  was normal in structure. Pulmonic valve regurgitation is not visualized. No evidence of pulmonic stenosis. Aorta: The aortic root is normal in size and structure. Venous: The inferior vena cava is dilated in size with less than 50% respiratory variability, suggesting right atrial pressure of 15 mmHg. IAS/Shunts: No atrial level shunt detected by color flow Doppler.  LEFT VENTRICLE PLAX 2D LVIDd:         4.90 cm   Diastology LVIDs:         4.20 cm   LV e' medial:    3.48 cm/s LV PW:         0.80 cm   LV E/e' medial:  34.5 LV IVS:        0.90 cm   LV e' lateral:   6.53 cm/s LVOT diam:     2.10 cm   LV E/e' lateral: 18.4 LV SV:         55 LV SV Index:   26 LVOT Area:     3.46 cm  RIGHT VENTRICLE            IVC RV Basal diam:  3.40 cm    IVC diam: 2.20 cm RV S prime:     8.16 cm/s TAPSE (M-mode): 1.1 cm LEFT ATRIUM              Index        RIGHT ATRIUM  Index LA diam:        4.80 cm  2.27 cm/m   RA Area:     19.60 cm LA Vol (A2C):   77.8 ml  36.80 ml/m  RA Volume:   54.00 ml  25.54 ml/m LA Vol (A4C):   101.0 ml 47.77 ml/m LA Biplane Vol: 78.6 ml  37.17 ml/m  AORTIC VALVE LVOT Vmax:   78.30 cm/s LVOT Vmean:  46.800 cm/s LVOT VTI:    0.158 m  AORTA Ao Root diam: 3.00 cm Ao Asc diam:  3.00 cm MITRAL VALVE                TRICUSPID VALVE MV Area (PHT): 4.06 cm     TR Peak grad:   30.0 mmHg MV Decel Time: 187 msec     TR Vmax:        274.00 cm/s MV E velocity: 120.00 cm/s MV A velocity: 87.40 cm/s   SHUNTS MV E/A ratio:  1.37         Systemic VTI:  0.16 m                             Systemic Diam: 2.10 cm Glori Bickers MD Electronically signed by Glori Bickers MD Signature Date/Time: 02/12/2022/4:55:43 PM    Final    DG Chest 2 View  Result Date: 02/11/2022 CLINICAL DATA:  Shortness of breath. EXAM: CHEST - 2 VIEW COMPARISON:  Chest two views 02/11/2022, earlier today, 03/14/2021 FINDINGS: Mildly decreased lung volumes, similar to 2 prior radiograph 2 hours earlier. The cardiac silhouette is again  partially obscured. Moderate atherosclerotic calcifications within the aortic arch. Mild-to-moderate left and mild right pleural effusions are unchanged. Associated left greater than right basilar atelectasis. No pneumothorax. No acute skeletal abnormality. IMPRESSION: Mild-to-moderate left and mild right pleural effusions and associated left greater than right basilar atelectasis are unchanged from radiograph earlier today. Electronically Signed   By: Yvonne Kendall M.D.   On: 02/11/2022 18:32    Microbiology: No results found for this or any previous visit.  Labs: CBC: Recent Labs  Lab 02/16/22 0825 02/16/22 0828 02/17/22 0127  WBC  --   --  7.0  HGB 11.9*  11.9* 11.9* 10.8*  HCT 35.0*  35.0* 35.0* 31.9*  MCV  --   --  77.4*  PLT  --   --  923   Basic Metabolic Panel: Recent Labs  Lab 02/18/22 1033 02/19/22 0059 02/20/22 0123 02/21/22 0109 02/22/22 0118  NA 137 135 136 136 137  K 4.1 4.1 4.2 4.4 4.4  CL 99 100 100 101 102  CO2 $Re'25 27 27 26 28  'dbq$ GLUCOSE 97 109* 117* 116* 89  BUN 35* 38* 38* 37* 35*  CREATININE 2.34* 2.43* 2.43* 2.34* 2.28*  CALCIUM 8.2* 7.7* 7.9* 7.9* 8.0*  MG  --  2.0  --   --   --   PHOS  --   --   --  3.8  --    Liver Function Tests: Recent Labs  Lab 02/21/22 0109  ALBUMIN 2.5*   CBG: Recent Labs  Lab 02/21/22 1148 02/21/22 1714 02/21/22 2135 02/22/22 0559 02/22/22 1112  GLUCAP 193* 159* 183* 86 184*    Discharge time spent: greater than 30 minutes.  Signed: Tawni Millers, MD [facility] 02/22/2022

## 2022-02-22 NOTE — Progress Notes (Signed)
Mobility Specialist Progress Note:   02/22/22 0843  Mobility  Activity Ambulated with assistance in hallway  Level of Assistance Contact guard assist, steadying assist  Assistive Device Front wheel walker  Distance Ambulated (ft) 400 ft  Activity Response Tolerated well  Mobility Referral Yes  $Mobility charge 1 Mobility   Pt received in bed and agreeable. No complaints. Pt returned sitting EOB with all needs met, call bell in reach, bed alarm on, and family in room.   Andrey Campanile Mobility Specialist Please contact via SecureChat or  Rehab office at [phone]

## 2022-02-27 ENCOUNTER — Other Ambulatory Visit: Payer: Self-pay

## 2022-02-27 ENCOUNTER — Ambulatory Visit: Payer: PPO | Attending: Internal Medicine

## 2022-02-27 DIAGNOSIS — I509 Heart failure, unspecified: Secondary | ICD-10-CM | POA: Insufficient documentation

## 2022-02-27 DIAGNOSIS — I5032 Chronic diastolic (congestive) heart failure: Secondary | ICD-10-CM | POA: Diagnosis not present

## 2022-02-27 DIAGNOSIS — E1142 Type 2 diabetes mellitus with diabetic polyneuropathy: Secondary | ICD-10-CM | POA: Diagnosis not present

## 2022-02-27 DIAGNOSIS — N184 Chronic kidney disease, stage 4 (severe): Secondary | ICD-10-CM | POA: Diagnosis not present

## 2022-02-27 DIAGNOSIS — R2689 Other abnormalities of gait and mobility: Secondary | ICD-10-CM | POA: Diagnosis not present

## 2022-02-27 DIAGNOSIS — M6281 Muscle weakness (generalized): Secondary | ICD-10-CM | POA: Diagnosis not present

## 2022-02-27 DIAGNOSIS — I129 Hypertensive chronic kidney disease with stage 1 through stage 4 chronic kidney disease, or unspecified chronic kidney disease: Secondary | ICD-10-CM | POA: Diagnosis not present

## 2022-02-27 DIAGNOSIS — E7800 Pure hypercholesterolemia, unspecified: Secondary | ICD-10-CM | POA: Diagnosis not present

## 2022-02-27 DIAGNOSIS — N2581 Secondary hyperparathyroidism of renal origin: Secondary | ICD-10-CM | POA: Diagnosis not present

## 2022-02-27 NOTE — Therapy (Signed)
OUTPATIENT PHYSICAL THERAPY THORACOLUMBAR EVALUATION   Patient Name: Daniel Warner MRN: [id number] DOB:July 12, 1940, 82 y.o., male Today's Date: 02/27/2022  END OF SESSION:  PT End of Session - 02/27/22 1203     Visit Number 1    Number of Visits 8    Date for PT Re-Evaluation 04/24/22    Authorization Type HTA    PT Start Time 1015    PT Stop Time 1045    PT Time Calculation (min) 30 min    Activity Tolerance Patient tolerated treatment well    Behavior During Therapy WFL for tasks assessed/performed             Past Medical History:  Diagnosis Date   Carotid artery disease ([place])    CKD (chronic kidney disease), stage IV ([place])    Diabetes mellitus type 2 with peripheral artery disease ([place])    Discolored skin    Right leg, thinks it's from a bag that has rubbed against his leg for so long. Doppler 12/2010   GERD (gastroesophageal reflux disease)    Hypercholesterolemia    Hyperlipidemia    Numbness of toes    In the morning   Obesity    Peripheral arterial disease ([place])    Past Surgical History:  Procedure Laterality Date   PERIPHERAL VASCULAR CATHETERIZATION N/A 10/16/2014   Procedure: Abdominal Aortogram w/Lower Extremity;  Surgeon: Serafina Mitchell, MD;  Location: [facility];  Service: Cardiovascular;  Laterality: N/A;   RIGHT/LEFT HEART CATH AND CORONARY ANGIOGRAPHY N/A 02/16/2022   Procedure: RIGHT/LEFT HEART CATH AND CORONARY ANGIOGRAPHY;  Surgeon: Lorretta Harp, MD;  Location: [facility];  Service: Cardiovascular;  Laterality: N/A;   TONSILLECTOMY     WISDOM TOOTH EXTRACTION     Patient Active Problem List   Diagnosis Date Noted   Pleural effusion 02/18/2022   Coronary artery disease due to lipid rich plaque 02/18/2022   AKI (acute kidney injury) ([place]) 02/18/2022   Acute on chronic combined systolic and diastolic CHF (congestive heart failure) ([place]) 02/13/2022   CHF (congestive heart failure) ([place]) 02/12/2022   Acute on chronic  systolic (congestive) heart failure ([place]) 02/11/2022   Type 2 diabetes mellitus with hyperlipidemia ([place]) 12/10/2021   Diabetic retinopathy ([place]) 03/07/2021   Hyperparathyroidism due to renal insufficiency ([place]) 03/07/2021   Peripheral venous insufficiency 03/07/2021   Anemia 05/21/2020   Chronic diastolic heart failure ([place]) 05/21/2020   Diabetic nephropathy ([place]) 05/21/2020   Hardening of the aorta (main artery of the heart) ([place]) 05/21/2020   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[place]) 05/21/2020   Pure hypercholesterolemia 05/21/2020   Stricture and stenosis of esophagus 05/21/2020   Diabetes ([place]) 04/08/2018   OSA (obstructive sleep apnea) 04/08/2018   Chronic kidney disease (CKD), stage IV (severe) ([place]) 04/08/2018   GERD (gastroesophageal reflux disease) 04/08/2018   PAD (peripheral artery disease) ([place]) 10/16/2014   Essential hypertension 04/11/2013   Mixed hyperlipidemia 04/11/2013   Occlusion and stenosis of carotid artery without mention of cerebral infarction 04/11/2013   Class 1 obesity 04/11/2013   Nonspecific abnormal electrocardiogram (ECG) (EKG) 04/11/2013    PCP: Kathalene Frames, MD   REFERRING PROVIDER: Tawni Millers, MD   REFERRING DIAG: R53.1 (ICD-10-CM) - Weakness   Rationale for Evaluation and Treatment: Rehabilitation  THERAPY DIAG:  Muscle weakness (generalized)  Other abnormalities of gait and mobility  Acute on chronic congestive heart failure, unspecified heart failure type ([place])  ONSET DATE: 02/11/22  SUBJECTIVE:  SUBJECTIVE STATEMENT: Patient denies pain post hospitalization, notes mild SOB and weak legs limiting his ability to walk long distances  PERTINENT HISTORY:  82 yo male 02/11/22 c/o SOB, BLE edema, showed pleural effusions on  CXR, sent to ED for diuresis by OP MD due to acute CHF. Received cardiac cath 2/5. PMH CHF, CKD, DM, HTN   PAIN:  Are you having pain? No  PRECAUTIONS: Other: cardiac  WEIGHT BEARING RESTRICTIONS: No  FALLS:  Has patient fallen in last 6 months? No  LIVING ENVIRONMENT: Lives with: lives with their family Lives in: House/apartment Stairs: Yes: External: 5 steps; bilateral but cannot reach both Has following equipment at home: Single point cane  OCCUPATION: retired.  PLOF: Independent  PATIENT GOALS: To walk farther  NEXT MD VISIT: 03/11/22  OBJECTIVE:   DIAGNOSTIC FINDINGS:  none  PATIENT SURVEYS:  Deferred due to time restraints  SCREENING FOR RED FLAGS: N/a   COGNITION: Overall cognitive status: Within functional limits for tasks assessed     MUSCLE LENGTH: N/T  POSTURE: rounded shoulders, forward head, decreased lumbar lordosis, decreased thoracic kyphosis, and flexed trunk   PALPATION: deferred  LUMBAR ROM: WFL for patient  AROM eval  Flexion   Extension   Right lateral flexion   Left lateral flexion   Right rotation   Left rotation    (Blank rows = not tested)  LOWER EXTREMITY ROM:   WFL for patient  Active  Right eval Left eval  Hip flexion    Hip extension    Hip abduction    Hip adduction    Hip internal rotation    Hip external rotation    Knee flexion    Knee extension    Ankle dorsiflexion    Ankle plantarflexion    Ankle inversion    Ankle eversion     (Blank rows = not tested)  LOWER EXTREMITY MMT:    MMT Right eval Left eval  Hip flexion 4- 4-  Hip extension 4- 4-  Hip abduction 4- 4-  Hip adduction    Hip internal rotation    Hip external rotation    Knee flexion    Knee extension 4- 4-  Ankle dorsiflexion    Ankle plantarflexion 4- 4-  Ankle inversion    Ankle eversion     (Blank rows = not tested)  LUMBAR SPECIAL TESTS:  Straight leg raise test: Negative and Slump test: Negative  FUNCTIONAL TESTS:  30  seconds chair stand test 11 reps with UE support 134/70 98% O2 sats baseline, dropping to 88% post exertion but returning to baseline within 1 minute  GAIT: Distance walked: 217ft Assistive device utilized: Single point cane Level of assistance: SBA Comments: slow cadence  2MWT 225 ft with cane  TODAY'S TREATMENT:  DATE: 02/27/22    PATIENT EDUCATION:  Education details: Discussed eval findings, rehab rationale and POC and patient is in agreement  Person educated: Patient and CGs Education method: Explanation Education comprehension: verbalized understanding and needs further education  HOME EXERCISE PROGRAM: TBD due to time constraints  ASSESSMENT:  CLINICAL IMPRESSION: Patient is a 82 y.o. male who was seen today for physical therapy evaluation and treatment for general deconditioning following hospital stay for CHF exacerbation.  He denies pain, 30s stand test finds LE weakness, BP WNL but O2 sats drop with exertion, limited to 263ft ambulation in 2 minute period, 4 stair limitation due to fatigue.  Patient would benefit from strength and endurance training, stair training and monitoring of )2 sats and compliance with CHF protocols.  OBJECTIVE IMPAIRMENTS: Abnormal gait, cardiopulmonary status limiting activity, decreased activity tolerance, decreased endurance, decreased knowledge of condition, decreased mobility, difficulty walking, decreased strength, and postural dysfunction.   ACTIVITY LIMITATIONS: carrying, lifting, stairs, and locomotion level  PARTICIPATION LIMITATIONS: driving and community activity  PERSONAL FACTORS: Age, Fitness, and 1 comorbidity: CHF  are also affecting patient's functional outcome.   REHAB POTENTIAL: Good  CLINICAL DECISION MAKING: Evolving/moderate complexity  EVALUATION COMPLEXITY: Low   GOALS: Goals reviewed with  patient? No  SHORT TERM GOALS: Target date: 04/10/2022  Patient to demonstrate independence in HEP  Baseline: TBD Goal status: INITIAL  2.  Patient to negotiate 8 stairs with single handrail with most appropriate pattern Baseline: 4 steps with single rail and cane Goal status: INITIAL   LONG TERM GOALS: Target date: 03/27/2022    Obtain and assess FOTO score against predicted value Baseline: TBD Goal status: INITIAL  2.  Increase BLE strength to 4/5 Baseline:  MMT Right eval Left eval  Hip flexion 4- 4-  Hip extension 4- 4-  Hip abduction 4- 4-  Hip adduction    Hip internal rotation    Hip external rotation    Knee flexion    Knee extension 4- 4-  Ankle dorsiflexion    Ankle plantarflexion 4- 4-   Goal status: INITIAL  3.  274ft with 2MWT and cane Baseline: 225 ft with cane Goal status: INITIAL  4.  Patient able to negotiate 16 steps with single rail and cane Baseline: 4 steps Goal status: INITIAL   PLAN:  PT FREQUENCY: 2x/week  PT DURATION: 4 weeks  PLANNED INTERVENTIONS: Therapeutic exercises, Therapeutic activity, Neuromuscular re-education, Balance training, Gait training, Patient/Family education, Self Care, Joint mobilization, Stair training, DME instructions, and Re-evaluation.  PLAN FOR NEXT SESSION: FOTO, stair training, HEP, ambulation with cane, LE strengthening, monitor O2 sats   Lanice Shirts, PT 02/27/2022, 1:59 PM

## 2022-03-04 ENCOUNTER — Ambulatory Visit: Payer: PPO

## 2022-03-04 DIAGNOSIS — M6281 Muscle weakness (generalized): Secondary | ICD-10-CM

## 2022-03-04 DIAGNOSIS — R2689 Other abnormalities of gait and mobility: Secondary | ICD-10-CM

## 2022-03-04 DIAGNOSIS — I509 Heart failure, unspecified: Secondary | ICD-10-CM

## 2022-03-04 NOTE — Therapy (Signed)
OUTPATIENT PHYSICAL THERAPY TREATMENT NOTE   Patient Name: Daniel Warner MRN: [id number] DOB:[birth year], 82 y.o., male Today's Date: 03/04/2022  PCP: Kathalene Frames, MD  REFERRING PROVIDER: Tawni Millers, MD    END OF SESSION:    Past Medical History:  Diagnosis Date   Carotid artery disease ([place])    CKD (chronic kidney disease), stage IV ([place])    Diabetes mellitus type 2 with peripheral artery disease ([place])    Discolored skin    Right leg, thinks it's from a bag that has rubbed against his leg for so long. Doppler 12/2010   GERD (gastroesophageal reflux disease)    Hypercholesterolemia    Hyperlipidemia    Numbness of toes    In the morning   Obesity    Peripheral arterial disease ([place])    Past Surgical History:  Procedure Laterality Date   PERIPHERAL VASCULAR CATHETERIZATION N/A 10/16/2014   Procedure: Abdominal Aortogram w/Lower Extremity;  Surgeon: Serafina Mitchell, MD;  Location: [facility];  Service: Cardiovascular;  Laterality: N/A;   RIGHT/LEFT HEART CATH AND CORONARY ANGIOGRAPHY N/A 02/16/2022   Procedure: RIGHT/LEFT HEART CATH AND CORONARY ANGIOGRAPHY;  Surgeon: Lorretta Harp, MD;  Location: [facility];  Service: Cardiovascular;  Laterality: N/A;   TONSILLECTOMY     WISDOM TOOTH EXTRACTION     Patient Active Problem List   Diagnosis Date Noted   Pleural effusion 02/18/2022   Coronary artery disease due to lipid rich plaque 02/18/2022   AKI (acute kidney injury) ([place]) 02/18/2022   Acute on chronic combined systolic and diastolic CHF (congestive heart failure) ([place]) 02/13/2022   CHF (congestive heart failure) ([place]) 02/12/2022   Acute on chronic systolic (congestive) heart failure ([place]) 02/11/2022   Type 2 diabetes mellitus with hyperlipidemia ([place]) 12/10/2021   Diabetic retinopathy ([place]) 03/07/2021   Hyperparathyroidism due to renal insufficiency ([place]) 03/07/2021   Peripheral venous insufficiency 03/07/2021   Anemia  05/21/2020   Chronic diastolic heart failure ([place]) 05/21/2020   Diabetic nephropathy ([place]) 05/21/2020   Hardening of the aorta (main artery of the heart) ([place]) 05/21/2020   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[place]) 05/21/2020   Pure hypercholesterolemia 05/21/2020   Stricture and stenosis of esophagus 05/21/2020   Diabetes ([place]) 04/08/2018   OSA (obstructive sleep apnea) 04/08/2018   Chronic kidney disease (CKD), stage IV (severe) ([facility]) 04/08/2018   GERD (gastroesophageal reflux disease) 04/08/2018   PAD (peripheral artery disease) ([place]) 10/16/2014   Essential hypertension 04/11/2013   Mixed hyperlipidemia 04/11/2013   Occlusion and stenosis of carotid artery without mention of cerebral infarction 04/11/2013   Class 1 obesity 04/11/2013   Nonspecific abnormal electrocardiogram (ECG) (EKG) 04/11/2013    REFERRING DIAG: R53.1 (ICD-10-CM) - Weakness    THERAPY DIAG: Muscle weakness (generalized)   Other abnormalities of gait and mobility   Acute on chronic congestive heart failure, unspecified heart failure type [facility])   Rationale for Evaluation and Treatment Rehabilitation  PERTINENT HISTORY: 82 yo male 02/11/22 c/o SOB, BLE edema, showed pleural effusions on CXR, sent to ED for diuresis by OP MD due to acute CHF. Received cardiac cath 2/5. PMH CHF, CKD, DM, HTN    PRECAUTIONS: Other: cardiac   SUBJECTIVE:  SUBJECTIVE STATEMENT:  No pain to report but some ischial discomfort noted due to weight    PAIN:  Are you having pain? No   OBJECTIVE: (objective measures completed at initial evaluation unless otherwise dated)   DIAGNOSTIC FINDINGS:  none   PATIENT SURVEYS:  Deferred due to time restraints   SCREENING FOR RED FLAGS: N/a    COGNITION: Overall cognitive status: Within  functional limits for tasks assessed                   MUSCLE LENGTH: N/T   POSTURE: rounded shoulders, forward head, decreased lumbar lordosis, decreased thoracic kyphosis, and flexed trunk    PALPATION: deferred   LUMBAR ROM: WFL for patient   AROM eval  Flexion    Extension    Right lateral flexion    Left lateral flexion    Right rotation    Left rotation     (Blank rows = not tested)   LOWER EXTREMITY ROM:   WFL for patient   Active  Right eval Left eval  Hip flexion      Hip extension      Hip abduction      Hip adduction      Hip internal rotation      Hip external rotation      Knee flexion      Knee extension      Ankle dorsiflexion      Ankle plantarflexion      Ankle inversion      Ankle eversion       (Blank rows = not tested)   LOWER EXTREMITY MMT:     MMT Right eval Left eval  Hip flexion 4- 4-  Hip extension 4- 4-  Hip abduction 4- 4-  Hip adduction      Hip internal rotation      Hip external rotation      Knee flexion      Knee extension 4- 4-  Ankle dorsiflexion      Ankle plantarflexion 4- 4-  Ankle inversion      Ankle eversion       (Blank rows = not tested)   LUMBAR SPECIAL TESTS:  Straight leg raise test: Negative and Slump test: Negative   FUNCTIONAL TESTS:  30 seconds chair stand test 11 reps with UE support 134/70 98% O2 sats baseline, dropping to 88% post exertion but returning to baseline within 1 minute   GAIT: Distance walked: 25ft Assistive device utilized: Single point cane Level of assistance: SBA Comments: slow cadence   2MWT 225 ft with cane   TODAY'S TREATMENT:       [facility] Adult PT Treatment:                                                DATE: 03/04/22 Pre-session vitals:  HR 66, O2 sat 95% BP 126/68 Therapeutic Exercise: Supine march 15/15 Supine bridge 15x Supine SLR 15/15 Supine OH flexion 2# stick Supine hip fallouts YTB 15x B and 15/15 unilaterally  Therapeutic Activity:   03/04/22 0001  6  Minute Walk- Baseline  BP (mmHg) 126/68  HR (bpm) 66  02 Sat (%RA) 95 %  6 Minute walk- Post Test  BP (mmHg) 142/81  HR (bpm) 88  02 Sat (%RA) 97 %  6 minute walk test results   Aerobic Endurance  Distance Walked 655                                                                                                                            DATE: 02/27/22 Eval and HEP     PATIENT EDUCATION:  Education details: Discussed eval findings, rehab rationale and POC and patient is in agreement  Person educated: Patient and CGs Education method: Explanation Education comprehension: verbalized understanding and needs further education   HOME EXERCISE PROGRAM: TBD due to time constraints  Access Code: [email or web address] URL: [email or web address] Date: 03/04/2022 Prepared by: Gustavus Bryant  Exercises - Supine Bridge  - 2 x daily - 5 x weekly - 1 sets - 10 reps - Supine March  - 2 x daily - 5 x weekly - 1 sets - 10 reps - Sit to Stand Without Arm Support  - 2 x daily - 5 x weekly - 1 sets - Standing Heel Raise with Support  - 2 x daily - 5 x weekly - 1 sets - 10 reps   ASSESSMENT:   CLINICAL IMPRESSION: Todays session obtained 6 MWT data, established an exercise program for strengthening of major muscle groups to challenge cardio.  Able to complete all requested tasks w/o any adverse reactions or SOB.  HEP established and issued.  VS's monitored as noted  Patient is a 82 y.o. male who was seen today for physical therapy evaluation and treatment for general deconditioning following hospital stay for CHF exacerbation.  He denies pain, 30s stand test finds LE weakness, BP WNL but O2 sats drop with exertion, limited to 226ft ambulation in 2 minute period, 4 stair limitation due to fatigue.  Patient would benefit from strength and endurance training, stair training and monitoring of )2 sats and compliance with CHF protocols.   OBJECTIVE IMPAIRMENTS: Abnormal gait, cardiopulmonary status  limiting activity, decreased activity tolerance, decreased endurance, decreased knowledge of condition, decreased mobility, difficulty walking, decreased strength, and postural dysfunction.    ACTIVITY LIMITATIONS: carrying, lifting, stairs, and locomotion level   PARTICIPATION LIMITATIONS: driving and community activity   PERSONAL FACTORS: Age, Fitness, and 1 comorbidity: CHF  are also affecting patient's functional outcome.    REHAB POTENTIAL: Good   CLINICAL DECISION MAKING: Evolving/moderate complexity   EVALUATION COMPLEXITY: Low     GOALS: Goals reviewed with patient? No   SHORT TERM GOALS: Target date: 04/10/2022   Patient to demonstrate independence in HEP  Baseline: TBD; [email or web address] Goal status: INITIAL   2.  Patient to negotiate 8 stairs with single handrail with most appropriate pattern Baseline: 4 steps with single rail and cane Goal status: INITIAL     LONG TERM GOALS: Target date: 03/27/2022     Obtain and assess FOTO score against predicted value Baseline: TBD Goal status: INITIAL   2.  Increase BLE strength to 4/5 Baseline:  MMT Right eval Left eval  Hip flexion 4- 4-  Hip extension 4- 4-  Hip abduction 4- 4-  Hip  adduction      Hip internal rotation      Hip external rotation      Knee flexion      Knee extension 4- 4-  Ankle dorsiflexion      Ankle plantarflexion 4- 4-    Goal status: INITIAL   3.  269ft with 2MWT and cane Baseline: 225 ft with cane Goal status: INITIAL   4.  Patient able to negotiate 16 steps with single rail and cane Baseline: 4 steps Goal status: INITIAL     PLAN:   PT FREQUENCY: 2x/week   PT DURATION: 4 weeks   PLANNED INTERVENTIONS: Therapeutic exercises, Therapeutic activity, Neuromuscular re-education, Balance training, Gait training, Patient/Family education, Self Care, Joint mobilization, Stair training, DME instructions, and Re-evaluation.   PLAN FOR NEXT SESSION: FOTO, stair training, HEP, ambulation with  cane, LE strengthening, monitor O2 sats   Lanice Shirts, PT 03/04/2022, 1:03 PM

## 2022-03-06 ENCOUNTER — Ambulatory Visit: Payer: PPO

## 2022-03-06 DIAGNOSIS — I509 Heart failure, unspecified: Secondary | ICD-10-CM

## 2022-03-06 DIAGNOSIS — R2689 Other abnormalities of gait and mobility: Secondary | ICD-10-CM

## 2022-03-06 DIAGNOSIS — M6281 Muscle weakness (generalized): Secondary | ICD-10-CM | POA: Diagnosis not present

## 2022-03-06 NOTE — Therapy (Signed)
OUTPATIENT PHYSICAL THERAPY TREATMENT NOTE   Patient Name: Daniel Warner MRN: [id number] DOB:[birth year], 82 y.o., male Today's Date: 03/06/2022  PCP: Kathalene Frames, MD  REFERRING PROVIDER: Tawni Millers, MD    END OF SESSION:   PT End of Session - 03/06/22 1209     Visit Number 3    Number of Visits 8    Date for PT Re-Evaluation 04/24/22    Authorization Type HTA    PT Start Time 1215    PT Stop Time 1255    PT Time Calculation (min) 40 min    Activity Tolerance Patient tolerated treatment well    Behavior During Therapy WFL for tasks assessed/performed             Past Medical History:  Diagnosis Date   Carotid artery disease ([place])    CKD (chronic kidney disease), stage IV ([place])    Diabetes mellitus type 2 with peripheral artery disease ([place])    Discolored skin    Right leg, thinks it's from a bag that has rubbed against his leg for so long. Doppler 12/2010   GERD (gastroesophageal reflux disease)    Hypercholesterolemia    Hyperlipidemia    Numbness of toes    In the morning   Obesity    Peripheral arterial disease ([place])    Past Surgical History:  Procedure Laterality Date   PERIPHERAL VASCULAR CATHETERIZATION N/A 10/16/2014   Procedure: Abdominal Aortogram w/Lower Extremity;  Surgeon: Serafina Mitchell, MD;  Location: [facility];  Service: Cardiovascular;  Laterality: N/A;   RIGHT/LEFT HEART CATH AND CORONARY ANGIOGRAPHY N/A 02/16/2022   Procedure: RIGHT/LEFT HEART CATH AND CORONARY ANGIOGRAPHY;  Surgeon: Lorretta Harp, MD;  Location: [facility];  Service: Cardiovascular;  Laterality: N/A;   TONSILLECTOMY     WISDOM TOOTH EXTRACTION     Patient Active Problem List   Diagnosis Date Noted   Pleural effusion 02/18/2022   Coronary artery disease due to lipid rich plaque 02/18/2022   AKI (acute kidney injury) ([place]) 02/18/2022   Acute on chronic combined systolic and diastolic CHF (congestive heart failure) ([place]) 02/13/2022    CHF (congestive heart failure) ([place]) 02/12/2022   Acute on chronic systolic (congestive) heart failure ([place]) 02/11/2022   Type 2 diabetes mellitus with hyperlipidemia ([place]) 12/10/2021   Diabetic retinopathy ([place]) 03/07/2021   Hyperparathyroidism due to renal insufficiency ([place]) 03/07/2021   Peripheral venous insufficiency 03/07/2021   Anemia 05/21/2020   Chronic diastolic heart failure ([place]) 05/21/2020   Diabetic nephropathy ([place]) 05/21/2020   Hardening of the aorta (main artery of the heart) ([place]) 05/21/2020   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[place]) 05/21/2020   Pure hypercholesterolemia 05/21/2020   Stricture and stenosis of esophagus 05/21/2020   Diabetes ([place]) 04/08/2018   OSA (obstructive sleep apnea) 04/08/2018   Chronic kidney disease (CKD), stage IV (severe) (HCC) 04/08/2018   GERD (gastroesophageal reflux disease) 04/08/2018   PAD (peripheral artery disease) ([place]) 10/16/2014   Essential hypertension 04/11/2013   Mixed hyperlipidemia 04/11/2013   Occlusion and stenosis of carotid artery without mention of cerebral infarction 04/11/2013   Class 1 obesity 04/11/2013   Nonspecific abnormal electrocardiogram (ECG) (EKG) 04/11/2013    REFERRING DIAG: R53.1 (ICD-10-CM) - Weakness    THERAPY DIAG: Muscle weakness (generalized)   Other abnormalities of gait and mobility   Acute on chronic congestive heart failure, unspecified heart failure type [facility])   Rationale for Evaluation and Treatment Rehabilitation  PERTINENT HISTORY: 82  yo male 02/11/22 c/o SOB, BLE edema, showed pleural effusions on CXR, sent to ED for diuresis by OP MD due to acute CHF. Received cardiac cath 2/5. PMH CHF, CKD, DM, HTN    PRECAUTIONS: Other: cardiac   SUBJECTIVE:                                                                                                                                                                                      SUBJECTIVE STATEMENT:  "Not up to it today".   Moved a lot of files up/down stairs and feels he overdid it.  Denies chest pain, palpitations or SOB.   PAIN:  Are you having pain? No   OBJECTIVE: (objective measures completed at initial evaluation unless otherwise dated)   DIAGNOSTIC FINDINGS:  none   PATIENT SURVEYS:  Deferred due to time restraints   SCREENING FOR RED FLAGS: N/a    COGNITION: Overall cognitive status: Within functional limits for tasks assessed                   MUSCLE LENGTH: N/T   POSTURE: rounded shoulders, forward head, decreased lumbar lordosis, decreased thoracic kyphosis, and flexed trunk    PALPATION: deferred   LUMBAR ROM: WFL for patient   AROM eval  Flexion    Extension    Right lateral flexion    Left lateral flexion    Right rotation    Left rotation     (Blank rows = not tested)   LOWER EXTREMITY ROM:   WFL for patient   Active  Right eval Left eval  Hip flexion      Hip extension      Hip abduction      Hip adduction      Hip internal rotation      Hip external rotation      Knee flexion      Knee extension      Ankle dorsiflexion      Ankle plantarflexion      Ankle inversion      Ankle eversion       (Blank rows = not tested)   LOWER EXTREMITY MMT:     MMT Right eval Left eval  Hip flexion 4- 4-  Hip extension 4- 4-  Hip abduction 4- 4-  Hip adduction      Hip internal rotation      Hip external rotation      Knee flexion      Knee extension 4- 4-  Ankle dorsiflexion      Ankle plantarflexion 4- 4-  Ankle inversion      Ankle eversion       (  Blank rows = not tested)   LUMBAR SPECIAL TESTS:  Straight leg raise test: Negative and Slump test: Negative   FUNCTIONAL TESTS:  30 seconds chair stand test 11 reps with UE support 134/70 98% O2 sats baseline, dropping to 88% post exertion but returning to baseline within 1 minute   GAIT: Distance walked: 257ft Assistive device utilized: Single point cane Level of assistance: SBA Comments: slow  cadence   2MWT 225 ft with cane   TODAY'S TREATMENT:     [facility] Adult PT Treatment:                                                DATE: 03/06/22 Pre-session vitals: BP:136/68  HR: 84 O2 sats: 98% Therapeutic Exercise: Supine march 15/15 2# Supine bridge 15x Supine SLR 15/15 2# SAQs 2# 15/15 FAQs 2# 15/15 Supine OH flexion 1# 15/15 alt Supine chest press 1# 15 Supine hip fallouts RTB 15x B and 15/15 unilaterally Seated FAQs 2#15/15 Seated march 2# 15/15 Seated bicep curls 1# 15/15 Nustep L2 5 min 60 SPM (98% O2 84 HR)   [facility] Adult PT Treatment:                                                DATE: 03/04/22 Pre-session vitals:  HR 66, O2 sat 95% BP 126/68 Therapeutic Exercise: Supine march 15/15 Supine bridge 15x Supine SLR 15/15 Supine OH flexion 2# stick Supine hip fallouts YTB 15x B and 15/15 unilaterally  Therapeutic Activity:   03/04/22 0001  6 Minute Walk- Baseline  BP (mmHg) 126/68  HR (bpm) 66  02 Sat (%RA) 95 %  6 Minute walk- Post Test  BP (mmHg) 142/81  HR (bpm) 88  02 Sat (%RA) 97 %  6 minute walk test results   Aerobic Endurance Distance Walked 655                                                                                                                            DATE: 02/27/22 Eval and HEP     PATIENT EDUCATION:  Education details: Discussed eval findings, rehab rationale and POC and patient is in agreement  Person educated: Patient and CGs Education method: Explanation Education comprehension: verbalized understanding and needs further education   HOME EXERCISE PROGRAM: TBD due to time constraints  Access Code: [email or web address] URL: [email or web address] Date: 03/04/2022 Prepared by: Sharlynn Oliphant  Exercises - Supine Bridge  - 2 x daily - 5 x weekly - 1 sets - 10 reps - Supine March  - 2 x daily - 5 x weekly - 1 sets - 10 reps - Sit to Stand Without Arm Support  - 2 x daily - 5 x  weekly - 1 sets - Standing Heel Raise with Support   - 2 x daily - 5 x weekly - 1 sets - 10 reps   ASSESSMENT:   CLINICAL IMPRESSION: Advanced to gentle PRE's today while periodically monitoring HR and O2 sats, always near baseline level.  Focus on major muscle groups for strength, circulation and cardio benefit.  Added aerobic work as noted.  Patient drove himself today  Patient is a 82 y.o. male who was seen today for physical therapy evaluation and treatment for general deconditioning following hospital stay for CHF exacerbation.  He denies pain, 30s stand test finds LE weakness, BP WNL but O2 sats drop with exertion, limited to 265ft ambulation in 2 minute period, 4 stair limitation due to fatigue.  Patient would benefit from strength and endurance training, stair training and monitoring of O2 sats and compliance with CHF protocols.   OBJECTIVE IMPAIRMENTS: Abnormal gait, cardiopulmonary status limiting activity, decreased activity tolerance, decreased endurance, decreased knowledge of condition, decreased mobility, difficulty walking, decreased strength, and postural dysfunction.    ACTIVITY LIMITATIONS: carrying, lifting, stairs, and locomotion level   PARTICIPATION LIMITATIONS: driving and community activity   PERSONAL FACTORS: Age, Fitness, and 1 comorbidity: CHF  are also affecting patient's functional outcome.    REHAB POTENTIAL: Good   CLINICAL DECISION MAKING: Evolving/moderate complexity   EVALUATION COMPLEXITY: Low     GOALS: Goals reviewed with patient? No   SHORT TERM GOALS: Target date: 04/10/2022   Patient to demonstrate independence in HEP  Baseline: TBD; [email or web address] Goal status: INITIAL   2.  Patient to negotiate 8 stairs with single handrail with most appropriate pattern Baseline: 4 steps with single rail and cane Goal status: INITIAL     LONG TERM GOALS: Target date: 03/27/2022     Obtain and assess FOTO score against predicted value Baseline: TBD Goal status: INITIAL   2.  Increase BLE strength to  4/5 Baseline:  MMT Right eval Left eval  Hip flexion 4- 4-  Hip extension 4- 4-  Hip abduction 4- 4-  Hip adduction      Hip internal rotation      Hip external rotation      Knee flexion      Knee extension 4- 4-  Ankle dorsiflexion      Ankle plantarflexion 4- 4-    Goal status: INITIAL   3.  231ft with 2MWT and cane Baseline: 225 ft with cane Goal status: Initial   4.  Patient able to negotiate 16 steps with single rail and cane Baseline: 4 steps Goal status: INITIAL     PLAN:   PT FREQUENCY: 2x/week   PT DURATION: 4 weeks   PLANNED INTERVENTIONS: Therapeutic exercises, Therapeutic activity, Neuromuscular re-education, Balance training, Gait training, Patient/Family education, Self Care, Joint mobilization, Stair training, DME instructions, and Re-evaluation.   PLAN FOR NEXT SESSION: FOTO, stair training, HEP, ambulation with cane, LE strengthening, monitor O2 sats   Lanice Shirts, PT 03/06/2022, 12:59 PM

## 2022-03-09 NOTE — Therapy (Signed)
OUTPATIENT PHYSICAL THERAPY TREATMENT NOTE   Patient Name: Daniel Warner MRN: [id number] DOB:09-13-1940, 82 y.o., male Today's Date: 03/10/2022  PCP: Emilio Aspen, MD  REFERRING PROVIDER: Coralie Keens, MD    END OF SESSION:   PT End of Session - 03/10/22 1148     Visit Number 4    Number of Visits 8    Date for PT Re-Evaluation 04/24/22    Authorization Type HTA    PT Start Time 1148    PT Stop Time 1230    PT Time Calculation (min) 42 min    Equipment Utilized During Treatment --    Activity Tolerance Patient tolerated treatment well    Behavior During Therapy WFL for tasks assessed/performed              Past Medical History:  Diagnosis Date   Carotid artery disease (HCC)    CKD (chronic kidney disease), stage IV (HCC)    Diabetes mellitus type 2 with peripheral artery disease ([facility])    Discolored skin    Right leg, thinks it's from a bag that has rubbed against his leg for so long. Doppler 12/2010   GERD (gastroesophageal reflux disease)    Hypercholesterolemia    Hyperlipidemia    Numbness of toes    In the morning   Obesity    Peripheral arterial disease (H[facility])    Past Surgical History:  Procedure Laterality Date   PERIPHERAL VASCULAR CATHETERIZATION N/A 10/16/2014   Procedure: Abdominal Aortogram w/Lower Extremity;  Surgeon: Nada Libman, MD;  Location: [facility];  Service: Cardiovascular;  Laterality: N/A;   RIGHT/LEFT HEART CATH AND CORONARY ANGIOGRAPHY N/A 02/16/2022   Procedure: RIGHT/LEFT HEART CATH AND CORONARY ANGIOGRAPHY;  Surgeon: Runell Gess, MD;  Location: [facility];  Service: Cardiovascular;  Laterality: N/A;   TONSILLECTOMY     WISDOM TOOTH EXTRACTION     Patient Active Problem List   Diagnosis Date Noted   Pleural effusion 02/18/2022   Coronary artery disease due to lipid rich plaque 02/18/2022   AKI (acute kidney injury) (HCC) 02/18/2022   Acute on chronic combined systolic and diastolic CHF  (congestive heart failure) (HCC) 02/13/2022   CHF (congestive heart failure) (HCC) 02/12/2022   Acute on chronic systolic (congestive) heart failure (HCC) 02/11/2022   Type 2 diabetes mellitus with hyperlipidemia (HCC) 12/10/2021   Diabetic retinopathy (HCC) 03/07/2021   Hyperparathyroidism due to renal insufficiency (HCC) 03/07/2021   Peripheral venous insufficiency 03/07/2021   Anemia 05/21/2020   Chronic diastolic heart failure (HCC) 05/21/2020   Diabetic nephropathy (HCC) 05/21/2020   Hardening of the aorta (main artery of the heart) (HCC) 05/21/2020   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HCC) 05/21/2020   Pure hypercholesterolemia 05/21/2020   Stricture and stenosis of esophagus 05/21/2020   Diabetes (HCC) 04/08/2018   OSA (obstructive sleep apnea) 04/08/2018   Chronic kidney disease (CKD), stage IV (severe) (HCC) 04/08/2018   GERD (gastroesophageal reflux disease) 04/08/2018   PAD (peripheral artery disease) (HCC) 10/16/2014   Essential hypertension 04/11/2013   Mixed hyperlipidemia 04/11/2013   Occlusion and stenosis of carotid artery without mention of cerebral infarction 04/11/2013   Class 1 obesity 04/11/2013   Nonspecific abnormal electrocardiogram (ECG) (EKG) 04/11/2013    REFERRING DIAG: R53.1 (ICD-10-CM) - Weakness    THERAPY DIAG: Muscle weakness (generalized)   Other abnormalities of gait and mobility   Acute on chronic congestive heart failure, unspecified heart failure type (HCC)   Rationale  for Evaluation and Treatment Rehabilitation  PERTINENT HISTORY: 82 yo male 02/11/22 c/o SOB, BLE edema, showed pleural effusions on CXR, sent to ED for diuresis by OP MD due to acute CHF. Received cardiac cath 2/5. PMH CHF, CKD, DM, HTN    PRECAUTIONS: Other: cardiac   SUBJECTIVE:                                                                                                                                                                                       SUBJECTIVE STATEMENT:  Pt states he is feeling alright today, no issues after last session. States he is feeling better than before hospitalization, primary issue is leg fatigue with activity.   PAIN:  Are you having pain? No   OBJECTIVE: (objective measures completed at initial evaluation unless otherwise dated)   DIAGNOSTIC FINDINGS:  none   PATIENT SURVEYS:  Deferred due to time restraints   SCREENING FOR RED FLAGS: N/a    COGNITION: Overall cognitive status: Within functional limits for tasks assessed                   MUSCLE LENGTH: N/T   POSTURE: rounded shoulders, forward head, decreased lumbar lordosis, decreased thoracic kyphosis, and flexed trunk    PALPATION: deferred   LUMBAR ROM: WFL for patient   AROM eval  Flexion    Extension    Right lateral flexion    Left lateral flexion    Right rotation    Left rotation     (Blank rows = not tested)   LOWER EXTREMITY ROM:   WFL for patient   Active  Right eval Left eval  Hip flexion      Hip extension      Hip abduction      Hip adduction      Hip internal rotation      Hip external rotation      Knee flexion      Knee extension      Ankle dorsiflexion      Ankle plantarflexion      Ankle inversion      Ankle eversion       (Blank rows = not tested)   LOWER EXTREMITY MMT:     MMT Right eval Left eval  Hip flexion 4- 4-  Hip extension 4- 4-  Hip abduction 4- 4-  Hip adduction      Hip internal rotation      Hip external rotation      Knee flexion      Knee extension 4- 4-  Ankle dorsiflexion      Ankle plantarflexion 4- 4-  Ankle inversion  Ankle eversion       (Blank rows = not tested)   LUMBAR SPECIAL TESTS:  Straight leg raise test: Negative and Slump test: Negative   FUNCTIONAL TESTS:  30 seconds chair stand test 11 reps with UE support 134/70 98% O2 sats baseline, dropping to 88% post exertion but returning to baseline within 1 minute   GAIT: Distance walked:  240ft Assistive device utilized: Single point cane Level of assistance: SBA Comments: slow cadence   2MWT 225 ft with cane   TODAY'S TREATMENT:     [facility] Adult PT Treatment:                                                DATE: 03/10/22 Pre session vitals: HR 70, SpO2 97% RA, BP 132/63; monitored and stable throughout Therapeutic Exercise: Seated bicep curls 3# B 2x15 cues for pacing  Supine bridge 2x15 cues for form and pacing 3# SLR B 2x10 cues for form, pacing, and muscular control  Standing heel raises 2x15  Fwd/retro stepping 2x5 laps at counter, no UE support, cues for fwd velocity and posture with retro stepping  3# LAQ x15 B LE cues for pacing   [facility] Adult PT Treatment:                                                DATE: 03/06/22 Pre-session vitals: BP:136/68  HR: 84 O2 sats: 98% Therapeutic Exercise: Supine march 15/15 2# Supine bridge 15x Supine SLR 15/15 2# SAQs 2# 15/15 FAQs 2# 15/15 Supine OH flexion 1# 15/15 alt Supine chest press 1# 15 Supine hip fallouts RTB 15x B and 15/15 unilaterally Seated FAQs 2#15/15 Seated march 2# 15/15 Seated bicep curls 1# 15/15 Nustep L2 5 min 60 SPM (98% O2 84 HR)   [facility] Adult PT Treatment:                                                DATE: 03/04/22 Pre-session vitals:  HR 66, O2 sat 95% BP 126/68 Therapeutic Exercise: Supine march 15/15 Supine bridge 15x Supine SLR 15/15 Supine OH flexion 2# stick Supine hip fallouts YTB 15x B and 15/15 unilaterally  Therapeutic Activity:   03/04/22 0001  6 Minute Walk- Baseline  BP (mmHg) 126/68  HR (bpm) 66  02 Sat (%RA) 95 %  6 Minute walk- Post Test  BP (mmHg) 142/81  HR (bpm) 88  02 Sat (%RA) 97 %  6 minute walk test results   Aerobic Endurance Distance Walked 655  DATE: 02/27/22 Eval and HEP     PATIENT EDUCATION:  Education details: rationale for  interventions Person educated: Patient  Education method: Explanation Education comprehension: verbalized understanding and needs further education   HOME EXERCISE PROGRAM: TBD due to time constraints  Access Code: [email or web address] URL: [email or web address] Date: 03/04/2022 Prepared by: Sharlynn Oliphant  Exercises - Supine Bridge  - 2 x daily - 5 x weekly - 1 sets - 10 reps - Supine March  - 2 x daily - 5 x weekly - 1 sets - 10 reps - Sit to Stand Without Arm Support  - 2 x daily - 5 x weekly - 1 sets - Standing Heel Raise with Support  - 2 x daily - 5 x weekly - 1 sets - 10 reps   ASSESSMENT:   CLINICAL IMPRESSION: Today pt continues to progress well with increased volume for activity, tolerates quite well. Requires cues for pacing as pt is very motivated to proceed with activity. No adverse events, vitals monitored throughout and stable. States he would like to work more on strengthening with transfers and stairs. Pt departs today's session in no acute distress, all voiced questions/concerns addressed appropriately from PT perspective.     OBJECTIVE IMPAIRMENTS: Abnormal gait, cardiopulmonary status limiting activity, decreased activity tolerance, decreased endurance, decreased knowledge of condition, decreased mobility, difficulty walking, decreased strength, and postural dysfunction.    ACTIVITY LIMITATIONS: carrying, lifting, stairs, and locomotion level   PARTICIPATION LIMITATIONS: driving and community activity   PERSONAL FACTORS: Age, Fitness, and 1 comorbidity: CHF  are also affecting patient's functional outcome.    REHAB POTENTIAL: Good   CLINICAL DECISION MAKING: Evolving/moderate complexity   EVALUATION COMPLEXITY: Low     GOALS: Goals reviewed with patient? No   SHORT TERM GOALS: Target date: 04/10/2022   Patient to demonstrate independence in HEP  Baseline: TBD; [email or web address] Goal status: INITIAL   2.  Patient to negotiate 8 stairs with single handrail  with most appropriate pattern Baseline: 4 steps with single rail and cane Goal status: INITIAL     LONG TERM GOALS: Target date: 03/27/2022     Obtain and assess FOTO score against predicted value Baseline: TBD Goal status: INITIAL   2.  Increase BLE strength to 4/5 Baseline:  MMT Right eval Left eval  Hip flexion 4- 4-  Hip extension 4- 4-  Hip abduction 4- 4-  Hip adduction      Hip internal rotation      Hip external rotation      Knee flexion      Knee extension 4- 4-  Ankle dorsiflexion      Ankle plantarflexion 4- 4-    Goal status: INITIAL   3.  243ft with 2MWT and cane Baseline: 225 ft with cane Goal status: Initial   4.  Patient able to negotiate 16 steps with single rail and cane Baseline: 4 steps Goal status: INITIAL     PLAN:   PT FREQUENCY: 2x/week   PT DURATION: 4 weeks   PLANNED INTERVENTIONS: Therapeutic exercises, Therapeutic activity, Neuromuscular re-education, Balance training, Gait training, Patient/Family education, Self Care, Joint mobilization, Stair training, DME instructions, and Re-evaluation.   PLAN FOR NEXT SESSION: stair training, transfers, continue w/ general strengthening. Stair training/transfers   Leeroy Cha PT, DPT 03/10/2022 12:37 PM

## 2022-03-09 NOTE — Progress Notes (Signed)
HEART & VASCULAR TRANSITION OF CARE CONSULT NOTE     Referring Physician:Dr Arrien  Primary Care: Dr Roxan Hockey  Primary Cardiologist:  HPI: Referred to clinic by Dr Cathlean Sauer for heart failure consultation.   Mr Luchsinger is a 82 year old with a history of  CKD Stage IV, HTN, HLD, and chronic HFrEF.   Admitted 02/11/22 with A/C HFrEF. Diuresed with IV lasix and transitioned to . Had cath with preserved cardiac output and low filling pressures. Echo showed. Buddy Duty on GDMT Discharged 02/22/22.   Cardiac Testing  Cath 02/2022  Ost Cx to Prox Cx lesion is 99% stenosed.  Ost LAD to Prox LAD lesion is 90% stenosed.  RA 6 PA 62/21 (37) PCWP 12 CO 7.8  CI 3.9   Echo 02/12/2022   1. Left ventricular ejection fraction, by estimation, is 25 to 30%. The  left ventricle has severely decreased function. The left ventricle  demonstrates global hypokinesis. Left ventricular diastolic parameters are  consistent with Grade II diastolic  dysfunction (pseudonormalization).   2. Right ventricular systolic function is moderately reduced. The right  ventricular size is normal. There is mildly elevated pulmonary artery  systolic pressure.   3. Left atrial size was moderately dilated.   4. Right atrial size was moderately dilated.   5. The mitral valve is normal in structure. Trivial mitral valve  regurgitation. No evidence of mitral stenosis.   6. The aortic valve is tricuspid. There is mild calcification of the  aortic valve. Aortic valve regurgitation is not visualized. Aortic valve  sclerosis/calcification is present, without any evidence of aortic  stenosis.   Review of Systems: [y] = yes, [ ]  = no   General: Weight gain [ ] ; Weight loss [ ] ; Anorexia [ ] ; Fatigue [ ] ; Fever [ ] ; Chills [ ] ; Weakness [ ]   Cardiac: Chest pain/pressure [ ] ; Resting SOB [ ] ; Exertional SOB [ ] ; Orthopnea [ ] ; Pedal Edema [ ] ; Palpitations [ ] ; Syncope [ ] ; Presyncope [ ] ; Paroxysmal nocturnal dyspnea[ ]    Pulmonary: Cough [ ] ; Wheezing[ ] ; Hemoptysis[ ] ; Sputum [ ] ; Snoring [ ]   GI: Vomiting[ ] ; Dysphagia[ ] ; Melena[ ] ; Hematochezia [ ] ; Heartburn[ ] ; Abdominal pain [ ] ; Constipation [ ] ; Diarrhea [ ] ; BRBPR [ ]   GU: Hematuria[ ] ; Dysuria [ ] ; Nocturia[ ]   Vascular: Pain in legs with walking [ ] ; Pain in feet with lying flat [ ] ; Non-healing sores [ ] ; Stroke [ ] ; TIA [ ] ; Slurred speech [ ] ;  Neuro: Headaches[ ] ; Vertigo[ ] ; Seizures[ ] ; Paresthesias[ ] ;Blurred vision [ ] ; Diplopia [ ] ; Vision changes [ ]   Ortho/Skin: Arthritis [ ] ; Joint pain [ ] ; Muscle pain [ ] ; Joint swelling [ ] ; Back Pain [ ] ; Rash [ ]   Psych: Depression[ ] ; Anxiety[ ]   Heme: Bleeding problems [ ] ; Clotting disorders [ ] ; Anemia [ ]   Endocrine: Diabetes [ ] ; Thyroid dysfunction[ ]    Past Medical History:  Diagnosis Date   Carotid artery disease (HCC)    CKD (chronic kidney disease), stage IV (HCC)    Diabetes mellitus type 2 with peripheral artery disease (HCC)    Discolored skin    Right leg, thinks it's from a bag that has rubbed against his leg for so long. Doppler 12/2010   GERD (gastroesophageal reflux disease)    Hypercholesterolemia    Hyperlipidemia    Numbness of toes    In the morning   Obesity    Peripheral arterial disease (  [place])     Current Outpatient Medications  Medication Sig Dispense Refill   aspirin EC 81 MG tablet Take 1 tablet (81 mg total) by mouth daily. Swallow whole. 30 tablet 12   atorvastatin (LIPITOR) 80 MG tablet Take 80 mg by mouth every morning.     calcitRIOL (ROCALTROL) 0.25 MCG capsule Take 0.25 mcg by mouth daily.  0   carvedilol (COREG) 3.125 MG tablet Take 1 tablet (3.125 mg total) by mouth 2 (two) times daily with a meal. 60 tablet 0   clopidogrel (PLAVIX) 75 MG tablet Take 1 tablet (75 mg total) by mouth daily with breakfast. 30 tablet 1   Continuous Blood Gluc Receiver (FREESTYLE LIBRE 14 DAY READER) DEVI      Continuous Blood Gluc Sensor (FREESTYLE LIBRE 14 DAY SENSOR)  MISC      hydrALAZINE (APRESOLINE) 10 MG tablet Take 1 tablet (10 mg total) by mouth in the morning and at bedtime. 60 tablet 0   isosorbide mononitrate (IMDUR) 30 MG 24 hr tablet Take 0.5 tablets (15 mg total) by mouth daily. 15 tablet 0   torsemide 40 MG TABS Take 40 mg by mouth daily. 30 tablet 0   TRULICITY 1.5 ZO/1.0RU SOPN Inject 1.5 mg into the skin once a week. Saturday or Sunday     No current facility-administered medications for this visit.    Allergies  Allergen Reactions   Losartan Potassium     Other reaction(s): itching and hand swelling   Lisinopril     Other reaction(s): Cough      Social History   Socioeconomic History   Marital status: Married    Spouse name: Joaquim Lai   Number of children: 1   Years of education: Not on file   Highest education level: Not on file  Occupational History   Occupation: retired  Tobacco Use   Smoking status: Never   Smokeless tobacco: Never  Vaping Use   Vaping Use: Never used  Substance and Sexual Activity   Alcohol use: No   Drug use: No   Sexual activity: Not on file  Other Topics Concern   Not on file  Social History Narrative   Not on file   Social Determinants of Health   Financial Resource Strain: Low Risk  (02/19/2022)   Overall Financial Resource Strain (CARDIA)    Difficulty of Paying Living Expenses: Not very hard  Food Insecurity: No Food Insecurity (02/14/2022)   Hunger Vital Sign    Worried About Running Out of Food in the Last Year: Never true    Ran Out of Food in the Last Year: Never true  Transportation Needs: No Transportation Needs (02/19/2022)   PRAPARE - Hydrologist (Medical): No    Lack of Transportation (Non-Medical): No  Physical Activity: Inactive (07/15/2018)   Exercise Vital Sign    Days of Exercise per Week: 0 days    Minutes of Exercise per Session: 0 min  Stress: No Stress Concern Present (07/15/2018)   [place]    Feeling of Stress : Not at all  Social Connections: Not on file  Intimate Partner Violence: Not At Risk (02/14/2022)   Humiliation, Afraid, Rape, and Kick questionnaire    Fear of Current or Ex-Partner: No    Emotionally Abused: No    Physically Abused: No    Sexually Abused: No      Family History  Problem Relation Age of Onset  Stroke Father    Irritable bowel syndrome Mother    Heart disease Paternal Aunt        CABG   Heart disease Cousin        CABG    There were no vitals filed for this visit. Lab Results  Component Value Date   CREATININE 2.28 (H) 02/22/2022   CREATININE 2.34 (H) 02/21/2022   CREATININE 2.43 (H) 02/20/2022    PHYSICAL EXAM: General:  Well appearing. No respiratory difficulty HEENT: normal Neck: supple. no JVD. Carotids 2+ bilat; no bruits. No lymphadenopathy or thryomegaly appreciated. Cor: PMI nondisplaced. Regular rate & rhythm. No rubs, gallops or murmurs. Lungs: clear Abdomen: soft, nontender, nondistended. No hepatosplenomegaly. No bruits or masses. Good bowel sounds. Extremities: no cyanosis, clubbing, rash, edema Neuro: alert & oriented x 3, cranial nerves grossly intact. moves all 4 extremities w/o difficulty. Affect pleasant.  ECG:   ASSESSMENT & PLAN: 1. Chronic HFrEF  NYHA *** GDMT  Diuretic- BB- Ace/ARB/ARNI MRA SGLT2i  2. HTN  3. CKD Stage IV.     Referred to HFSW (PCP, Medications, Transportation, ETOH Abuse, Drug Abuse, Insurance, Financial ): Yes or No Refer to Pharmacy: Yes or No Refer to Home Health: Yes on No Refer to Advanced Heart Failure Clinic: Yes or no  Refer to General Cardiology: Yes or No  Follow up

## 2022-03-10 ENCOUNTER — Encounter: Payer: Self-pay | Admitting: Physical Therapy

## 2022-03-10 ENCOUNTER — Ambulatory Visit: Payer: PPO | Admitting: Physical Therapy

## 2022-03-10 ENCOUNTER — Telehealth (HOSPITAL_COMMUNITY): Payer: Self-pay

## 2022-03-10 DIAGNOSIS — M6281 Muscle weakness (generalized): Secondary | ICD-10-CM | POA: Diagnosis not present

## 2022-03-10 DIAGNOSIS — R2689 Other abnormalities of gait and mobility: Secondary | ICD-10-CM

## 2022-03-10 DIAGNOSIS — I509 Heart failure, unspecified: Secondary | ICD-10-CM

## 2022-03-10 NOTE — Telephone Encounter (Signed)
Left message to confirm appointment for 03/11/22

## 2022-03-11 ENCOUNTER — Encounter (HOSPITAL_COMMUNITY): Payer: Self-pay

## 2022-03-11 ENCOUNTER — Ambulatory Visit (HOSPITAL_COMMUNITY)
Admission: RE | Admit: 2022-03-11 | Discharge: 2022-03-11 | Disposition: A | Discharge status: Home / Self Care | Payer: PPO | Source: Ambulatory Visit | Attending: Adult Health | Admitting: Adult Health

## 2022-03-11 VITALS — BP 128/68 | HR 65 | Wt 187.2 lb

## 2022-03-11 DIAGNOSIS — I10 Essential (primary) hypertension: Secondary | ICD-10-CM | POA: Diagnosis not present

## 2022-03-11 DIAGNOSIS — I5022 Chronic systolic (congestive) heart failure: Secondary | ICD-10-CM

## 2022-03-11 DIAGNOSIS — Z8249 Family history of ischemic heart disease and other diseases of the circulatory system: Secondary | ICD-10-CM | POA: Diagnosis not present

## 2022-03-11 DIAGNOSIS — Z7985 Long-term (current) use of injectable non-insulin antidiabetic drugs: Secondary | ICD-10-CM | POA: Insufficient documentation

## 2022-03-11 DIAGNOSIS — I5032 Chronic diastolic (congestive) heart failure: Secondary | ICD-10-CM

## 2022-03-11 DIAGNOSIS — I2510 Atherosclerotic heart disease of native coronary artery without angina pectoris: Secondary | ICD-10-CM | POA: Diagnosis not present

## 2022-03-11 DIAGNOSIS — N184 Chronic kidney disease, stage 4 (severe): Secondary | ICD-10-CM | POA: Diagnosis not present

## 2022-03-11 DIAGNOSIS — I255 Ischemic cardiomyopathy: Secondary | ICD-10-CM | POA: Insufficient documentation

## 2022-03-11 DIAGNOSIS — Z79899 Other long term (current) drug therapy: Secondary | ICD-10-CM | POA: Insufficient documentation

## 2022-03-11 DIAGNOSIS — I130 Hypertensive heart and chronic kidney disease with heart failure and stage 1 through stage 4 chronic kidney disease, or unspecified chronic kidney disease: Secondary | ICD-10-CM | POA: Diagnosis not present

## 2022-03-11 LAB — BASIC METABOLIC PANEL
Anion gap: 10 (ref 5–15)
BUN: 33 mg/dL — ABNORMAL HIGH (ref 8–23)
CO2: 29 mmol/L (ref 22–32)
Calcium: 8.6 mg/dL — ABNORMAL LOW (ref 8.9–10.3)
Chloride: 98 mmol/L (ref 98–111)
Creatinine, Ser: 2.12 mg/dL — ABNORMAL HIGH (ref 0.61–1.24)
GFR, Estimated: 30 mL/min — ABNORMAL LOW (ref >60)
Glucose, Bld: 139 mg/dL — ABNORMAL HIGH (ref 70–99)
Potassium: 4.2 mmol/L (ref 3.5–5.1)
Sodium: 137 mmol/L (ref 135–145)

## 2022-03-11 NOTE — Therapy (Signed)
OUTPATIENT PHYSICAL THERAPY TREATMENT NOTE   Patient Name: Daniel Warner MRN: [id number] DOB:July 03, 1940, 82 y.o., male Today's Date: 03/12/2022  PCP: Kathalene Frames, MD  REFERRING PROVIDER: Tawni Millers, MD    END OF SESSION:   PT End of Session - 03/12/22 1203     Visit Number 5    Number of Visits 8    Date for PT Re-Evaluation 04/24/22    Authorization Type HTA    PT Start Time 1203   late check in   PT Stop Time 1229    PT Time Calculation (min) 26 min    Activity Tolerance Patient tolerated treatment well    Behavior During Therapy WFL for tasks assessed/performed               Past Medical History:  Diagnosis Date   Carotid artery disease ([place])    CKD (chronic kidney disease), stage IV ([place])    Diabetes mellitus type 2 with peripheral artery disease ([place])    Discolored skin    Right leg, thinks it's from a bag that has rubbed against his leg for so long. Doppler 12/2010   GERD (gastroesophageal reflux disease)    Hypercholesterolemia    Hyperlipidemia    Numbness of toes    In the morning   Obesity    Peripheral arterial disease ([place])    Past Surgical History:  Procedure Laterality Date   PERIPHERAL VASCULAR CATHETERIZATION N/A 10/16/2014   Procedure: Abdominal Aortogram w/Lower Extremity;  Surgeon: Serafina Mitchell, MD;  Location: [facility];  Service: Cardiovascular;  Laterality: N/A;   RIGHT/LEFT HEART CATH AND CORONARY ANGIOGRAPHY N/A 02/16/2022   Procedure: RIGHT/LEFT HEART CATH AND CORONARY ANGIOGRAPHY;  Surgeon: Lorretta Harp, MD;  Location: [facility];  Service: Cardiovascular;  Laterality: N/A;   TONSILLECTOMY     WISDOM TOOTH EXTRACTION     Patient Active Problem List   Diagnosis Date Noted   Pleural effusion 02/18/2022   Coronary artery disease due to lipid rich plaque 02/18/2022   AKI (acute kidney injury) ([place]) 02/18/2022   Acute on chronic combined systolic and diastolic CHF (congestive heart  failure) ([place]) 02/13/2022   CHF (congestive heart failure) ([place]) 02/12/2022   Acute on chronic systolic (congestive) heart failure ([place]) 02/11/2022   Type 2 diabetes mellitus with hyperlipidemia ([place]) 12/10/2021   Diabetic retinopathy ([place]) 03/07/2021   Hyperparathyroidism due to renal insufficiency ([place]) 03/07/2021   Peripheral venous insufficiency 03/07/2021   Anemia 05/21/2020   Chronic diastolic heart failure ([place]) 05/21/2020   Diabetic nephropathy ([place]) 05/21/2020   Hardening of the aorta (main artery of the heart) ([place]) 05/21/2020   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[place]) 05/21/2020   Pure hypercholesterolemia 05/21/2020   Stricture and stenosis of esophagus 05/21/2020   Diabetes ([place]) 04/08/2018   OSA (obstructive sleep apnea) 04/08/2018   Chronic kidney disease (CKD), stage IV (severe) (HCC) 04/08/2018   GERD (gastroesophageal reflux disease) 04/08/2018   PAD (peripheral artery disease) ([place]) 10/16/2014   Essential hypertension 04/11/2013   Mixed hyperlipidemia 04/11/2013   Occlusion and stenosis of carotid artery without mention of cerebral infarction 04/11/2013   Class 1 obesity 04/11/2013   Nonspecific abnormal electrocardiogram (ECG) (EKG) 04/11/2013    REFERRING DIAG: R53.1 (ICD-10-CM) - Weakness    THERAPY DIAG: Muscle weakness (generalized)   Other abnormalities of gait and mobility   Acute on chronic congestive heart failure, unspecified heart failure type ([place])   Rationale for Evaluation and  Treatment Rehabilitation  PERTINENT HISTORY: 82 yo male 02/11/22 c/o SOB, BLE edema, showed pleural effusions on CXR, sent to ED for diuresis by OP MD due to acute CHF. Received cardiac cath 2/5. PMH CHF, CKD, DM, HTN    PRECAUTIONS: Other: cardiac   SUBJECTIVE:                                                                                                                                                                                      SUBJECTIVE  STATEMENT:  Pt arrives w/o complaint, states he did well since last session. No new updates, continues to report primary limitation of leg fatigue with activity.  PAIN:  Are you having pain? No   OBJECTIVE: (objective measures completed at initial evaluation unless otherwise dated)   DIAGNOSTIC FINDINGS:  none   PATIENT SURVEYS:  Deferred due to time restraints   SCREENING FOR RED FLAGS: N/a    COGNITION: Overall cognitive status: Within functional limits for tasks assessed                   MUSCLE LENGTH: N/T   POSTURE: rounded shoulders, forward head, decreased lumbar lordosis, decreased thoracic kyphosis, and flexed trunk    PALPATION: deferred   LUMBAR ROM: WFL for patient   AROM eval  Flexion    Extension    Right lateral flexion    Left lateral flexion    Right rotation    Left rotation     (Blank rows = not tested)   LOWER EXTREMITY ROM:   WFL for patient   Active  Right eval Left eval  Hip flexion      Hip extension      Hip abduction      Hip adduction      Hip internal rotation      Hip external rotation      Knee flexion      Knee extension      Ankle dorsiflexion      Ankle plantarflexion      Ankle inversion      Ankle eversion       (Blank rows = not tested)   LOWER EXTREMITY MMT:     MMT Right eval Left eval  Hip flexion 4- 4-  Hip extension 4- 4-  Hip abduction 4- 4-  Hip adduction      Hip internal rotation      Hip external rotation      Knee flexion      Knee extension 4- 4-  Ankle dorsiflexion      Ankle plantarflexion 4- 4-  Ankle inversion      Ankle eversion       (  Blank rows = not tested)   LUMBAR SPECIAL TESTS:  Straight leg raise test: Negative and Slump test: Negative   FUNCTIONAL TESTS:  30 seconds chair stand test 11 reps with UE support 134/70 98% O2 sats baseline, dropping to 88% post exertion but returning to baseline within 1 minute   GAIT: Distance walked: 233ft Assistive device utilized: Single  point cane Level of assistance: SBA Comments: slow cadence   2MWT 225 ft with cane   TODAY'S TREATMENT:     [facility] Adult PT Treatment:                                                DATE: 03/12/22 Pre session vitals: BP 145/68 HR 71 SpO2 95% RA; seated    Therapeutic Exercise: Standing marches unilat UE support alternating, 2x10 each LE cues for pacing and full ROM  4 inch step ups fwd, unilat UE support x5 each LE; 6 inch step ups fwd x5 B LE, 8 inch step x5 BLE cues for pacing, posture, monitoring HR/SpO2 throughout and stable 5# bicep curl B 2x10 cues for form and pacing  2.5# seated march x12 B LE cues for posture, reduced trunk lean, and control Post session vitals: HR 74, SpO2 96%RA, BP 128/69 seated   [facility] Adult PT Treatment:                                                DATE: 03/10/22 Pre session vitals: HR 70, SpO2 97% RA, BP 132/63; monitored and stable throughout Therapeutic Exercise: Seated bicep curls 3# B 2x15 cues for pacing  Supine bridge 2x15 cues for form and pacing 3# SLR B 2x10 cues for form, pacing, and muscular control  Standing heel raises 2x15  Fwd/retro stepping 2x5 laps at counter, no UE support, cues for fwd velocity and posture with retro stepping  3# LAQ x15 B LE cues for pacing   [facility] Adult PT Treatment:                                                DATE: 03/06/22 Pre-session vitals: BP:136/68  HR: 84 O2 sats: 98% Therapeutic Exercise: Supine march 15/15 2# Supine bridge 15x Supine SLR 15/15 2# SAQs 2# 15/15 FAQs 2# 15/15 Supine OH flexion 1# 15/15 alt Supine chest press 1# 15 Supine hip fallouts RTB 15x B and 15/15 unilaterally Seated FAQs 2#15/15 Seated march 2# 15/15 Seated bicep curls 1# 15/15 Nustep L2 5 min 60 SPM (98% O2 84 HR)   [facility] Adult PT Treatment:                                                DATE: 03/04/22 Pre-session vitals:  HR 66, O2 sat 95% BP 126/68 Therapeutic Exercise: Supine march 15/15 Supine bridge 15x Supine SLR  15/15 Supine OH flexion 2# stick Supine hip fallouts YTB 15x B and 15/15 unilaterally  Therapeutic Activity:   03/04/22 0001  6 Minute Walk-  Baseline  BP (mmHg) 126/68  HR (bpm) 66  02 Sat (%RA) 95 %  6 Minute walk- Post Test  BP (mmHg) 142/81  HR (bpm) 88  02 Sat (%RA) 97 %  6 minute walk test results   Aerobic Endurance Distance Walked 655                                                                                                                            DATE: 02/27/22 Eval and HEP     PATIENT EDUCATION:  Education details: rationale for interventions Person educated: Patient  Education method: Explanation Education comprehension: verbalized understanding and needs further education   HOME EXERCISE PROGRAM: TBD due to time constraints  Access Code: [email or web address] URL: [email or web address] Date: 03/04/2022 Prepared by: Sharlynn Oliphant  Exercises - Supine Bridge  - 2 x daily - 5 x weekly - 1 sets - 10 reps - Supine March  - 2 x daily - 5 x weekly - 1 sets - 10 reps - Sit to Stand Without Arm Support  - 2 x daily - 5 x weekly - 1 sets - Standing Heel Raise with Support  - 2 x daily - 5 x weekly - 1 sets - 10 reps   ASSESSMENT:   CLINICAL IMPRESSION:  Pt arrives today without complaint, states he felt good after last session. Session shortened due to late check in. Continued to work on activity tolerance and generalized strengthening, today able to incorporate stair training as pt states this is one of the more difficult activities for him. Despite this, he does quite well today - he requests increase of step height with each set, report of gradual muscle fatigue but no adverse symptoms, vitals remain stable. Also progressed resistance for familiar exercise. No adverse events or pain, vitals monitored throughout and are stable. Recommend continuing along current POC to maximize functional tolerance. Pt departs today's session in no acute distress, all voiced  questions/concerns addressed appropriately from PT perspective.      OBJECTIVE IMPAIRMENTS: Abnormal gait, cardiopulmonary status limiting activity, decreased activity tolerance, decreased endurance, decreased knowledge of condition, decreased mobility, difficulty walking, decreased strength, and postural dysfunction.    ACTIVITY LIMITATIONS: carrying, lifting, stairs, and locomotion level   PARTICIPATION LIMITATIONS: driving and community activity   PERSONAL FACTORS: Age, Fitness, and 1 comorbidity: CHF  are also affecting patient's functional outcome.    REHAB POTENTIAL: Good   CLINICAL DECISION MAKING: Evolving/moderate complexity   EVALUATION COMPLEXITY: Low     GOALS: Goals reviewed with patient? No   SHORT TERM GOALS: Target date: 04/10/2022   Patient to demonstrate independence in HEP  Baseline: TBD; [email or web address] Goal status: INITIAL   2.  Patient to negotiate 8 stairs with single handrail with most appropriate pattern Baseline: 4 steps with single rail and cane Goal status: INITIAL     LONG TERM GOALS: Target date: 03/27/2022     Obtain and assess FOTO score against predicted value Baseline: TBD Goal  status: INITIAL   2.  Increase BLE strength to 4/5 Baseline:  MMT Right eval Left eval  Hip flexion 4- 4-  Hip extension 4- 4-  Hip abduction 4- 4-  Hip adduction      Hip internal rotation      Hip external rotation      Knee flexion      Knee extension 4- 4-  Ankle dorsiflexion      Ankle plantarflexion 4- 4-    Goal status: INITIAL   3.  286ft with 2MWT and cane Baseline: 225 ft with cane Goal status: Initial   4.  Patient able to negotiate 16 steps with single rail and cane Baseline: 4 steps Goal status: INITIAL     PLAN:   PT FREQUENCY: 2x/week   PT DURATION: 4 weeks   PLANNED INTERVENTIONS: Therapeutic exercises, Therapeutic activity, Neuromuscular re-education, Balance training, Gait training, Patient/Family education, Self Care, Joint  mobilization, Stair training, DME instructions, and Re-evaluation.   PLAN FOR NEXT SESSION: stair training, transfers, continue w/ general strengthening.   Leeroy Cha PT, DPT 03/12/2022 12:35 PM

## 2022-03-11 NOTE — Patient Instructions (Signed)
Labs today - will call you if abnormal. Follow up with Dr. Irish Lack in general Cardiology as scheduled. Call Heart Failure Clinic if you have any needs from [place] in the future.

## 2022-03-12 ENCOUNTER — Ambulatory Visit: Payer: PPO | Admitting: Physical Therapy

## 2022-03-12 ENCOUNTER — Encounter: Payer: Self-pay | Admitting: Physical Therapy

## 2022-03-12 DIAGNOSIS — R2689 Other abnormalities of gait and mobility: Secondary | ICD-10-CM

## 2022-03-12 DIAGNOSIS — I509 Heart failure, unspecified: Secondary | ICD-10-CM

## 2022-03-12 DIAGNOSIS — M6281 Muscle weakness (generalized): Secondary | ICD-10-CM | POA: Diagnosis not present

## 2022-03-13 DIAGNOSIS — E1121 Type 2 diabetes mellitus with diabetic nephropathy: Secondary | ICD-10-CM | POA: Diagnosis not present

## 2022-03-13 DIAGNOSIS — N184 Chronic kidney disease, stage 4 (severe): Secondary | ICD-10-CM | POA: Diagnosis not present

## 2022-03-13 DIAGNOSIS — E7800 Pure hypercholesterolemia, unspecified: Secondary | ICD-10-CM | POA: Diagnosis not present

## 2022-03-13 DIAGNOSIS — E1142 Type 2 diabetes mellitus with diabetic polyneuropathy: Secondary | ICD-10-CM | POA: Diagnosis not present

## 2022-03-13 DIAGNOSIS — I130 Hypertensive heart and chronic kidney disease with heart failure and stage 1 through stage 4 chronic kidney disease, or unspecified chronic kidney disease: Secondary | ICD-10-CM | POA: Diagnosis not present

## 2022-03-13 DIAGNOSIS — I5032 Chronic diastolic (congestive) heart failure: Secondary | ICD-10-CM | POA: Diagnosis not present

## 2022-03-13 DIAGNOSIS — I25119 Atherosclerotic heart disease of native coronary artery with unspecified angina pectoris: Secondary | ICD-10-CM | POA: Diagnosis not present

## 2022-03-13 DIAGNOSIS — E1151 Type 2 diabetes mellitus with diabetic peripheral angiopathy without gangrene: Secondary | ICD-10-CM | POA: Diagnosis not present

## 2022-03-13 DIAGNOSIS — E11319 Type 2 diabetes mellitus with unspecified diabetic retinopathy without macular edema: Secondary | ICD-10-CM | POA: Diagnosis not present

## 2022-03-16 ENCOUNTER — Encounter: Payer: Self-pay | Admitting: Physical Therapy

## 2022-03-16 ENCOUNTER — Ambulatory Visit: Payer: PPO | Attending: Internal Medicine | Admitting: Physical Therapy

## 2022-03-16 DIAGNOSIS — R2689 Other abnormalities of gait and mobility: Secondary | ICD-10-CM | POA: Diagnosis not present

## 2022-03-16 DIAGNOSIS — I509 Heart failure, unspecified: Secondary | ICD-10-CM | POA: Diagnosis not present

## 2022-03-16 DIAGNOSIS — M6281 Muscle weakness (generalized): Secondary | ICD-10-CM | POA: Diagnosis not present

## 2022-03-16 NOTE — Therapy (Signed)
OUTPATIENT PHYSICAL THERAPY TREATMENT NOTE   Patient Name: Daniel Warner MRN: [id number] DOB:[birth year], 82 y.o., male Today's Date: 03/16/2022  PCP: Kathalene Frames, MD  REFERRING PROVIDER: Tawni Millers, MD    END OF SESSION:   PT End of Session - 03/16/22 1146     Visit Number 6    Number of Visits 8    Date for PT Re-Evaluation 04/24/22    Authorization Type HTA    PT Start Time 1147    PT Stop Time 1229    PT Time Calculation (min) 42 min    Activity Tolerance Patient tolerated treatment well    Behavior During Therapy WFL for tasks assessed/performed                Past Medical History:  Diagnosis Date   Carotid artery disease ([place])    CKD (chronic kidney disease), stage IV ([place])    Diabetes mellitus type 2 with peripheral artery disease ([place])    Discolored skin    Right leg, thinks it's from a bag that has rubbed against his leg for so long. Doppler 12/2010   GERD (gastroesophageal reflux disease)    Hypercholesterolemia    Hyperlipidemia    Numbness of toes    In the morning   Obesity    Peripheral arterial disease ([place])    Past Surgical History:  Procedure Laterality Date   PERIPHERAL VASCULAR CATHETERIZATION N/A 10/16/2014   Procedure: Abdominal Aortogram w/Lower Extremity;  Surgeon: Serafina Mitchell, MD;  Location: [facility];  Service: Cardiovascular;  Laterality: N/A;   RIGHT/LEFT HEART CATH AND CORONARY ANGIOGRAPHY N/A 02/16/2022   Procedure: RIGHT/LEFT HEART CATH AND CORONARY ANGIOGRAPHY;  Surgeon: Lorretta Harp, MD;  Location: [facility];  Service: Cardiovascular;  Laterality: N/A;   TONSILLECTOMY     WISDOM TOOTH EXTRACTION     Patient Active Problem List   Diagnosis Date Noted   Pleural effusion 02/18/2022   Coronary artery disease due to lipid rich plaque 02/18/2022   AKI (acute kidney injury) ([place]) 02/18/2022   Acute on chronic combined systolic and diastolic CHF (congestive heart failure) ([place])  02/13/2022   CHF (congestive heart failure) ([place]) 02/12/2022   Acute on chronic systolic (congestive) heart failure ([place]) 02/11/2022   Type 2 diabetes mellitus with hyperlipidemia ([place]) 12/10/2021   Diabetic retinopathy ([place]) 03/07/2021   Hyperparathyroidism due to renal insufficiency ([place]) 03/07/2021   Peripheral venous insufficiency 03/07/2021   Anemia 05/21/2020   Chronic diastolic heart failure ([place]) 05/21/2020   Diabetic nephropathy ([place]) 05/21/2020   Hardening of the aorta (main artery of the heart) ([place]) 05/21/2020   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[place]) 05/21/2020   Pure hypercholesterolemia 05/21/2020   Stricture and stenosis of esophagus 05/21/2020   Diabetes ([place]) 04/08/2018   OSA (obstructive sleep apnea) 04/08/2018   Chronic kidney disease (CKD), stage IV (severe) (HCC) 04/08/2018   GERD (gastroesophageal reflux disease) 04/08/2018   PAD (peripheral artery disease) ([place]) 10/16/2014   Essential hypertension 04/11/2013   Mixed hyperlipidemia 04/11/2013   Occlusion and stenosis of carotid artery without mention of cerebral infarction 04/11/2013   Class 1 obesity 04/11/2013   Nonspecific abnormal electrocardiogram (ECG) (EKG) 04/11/2013    REFERRING DIAG: R53.1 (ICD-10-CM) - Weakness    THERAPY DIAG: Muscle weakness (generalized)   Other abnormalities of gait and mobility   Acute on chronic congestive heart failure, unspecified heart failure type [facility])   Rationale for Evaluation and Treatment Rehabilitation  PERTINENT HISTORY: 82 yo male 02/11/22 c/o SOB, BLE edema, showed pleural effusions on CXR, sent to ED for diuresis by OP MD due to acute CHF. Received cardiac cath 2/5. PMH CHF, CKD, DM, HTN    PRECAUTIONS: Other: cardiac   SUBJECTIVE:                                                                                                                                                                                      SUBJECTIVE STATEMENT:  Pt  arrives w/o complaint, states he did well after last session. Did develop some L foot pain on Saturday, denies MOI or change in activity, states it is improved today.   PAIN:  Are you having pain? No   OBJECTIVE: (objective measures completed at initial evaluation unless otherwise dated)   DIAGNOSTIC FINDINGS:  none   PATIENT SURVEYS:  Deferred due to time restraints   SCREENING FOR RED FLAGS: N/a    COGNITION: Overall cognitive status: Within functional limits for tasks assessed                   MUSCLE LENGTH: N/T   POSTURE: rounded shoulders, forward head, decreased lumbar lordosis, decreased thoracic kyphosis, and flexed trunk    PALPATION: deferred   LUMBAR ROM: WFL for patient   AROM eval  Flexion    Extension    Right lateral flexion    Left lateral flexion    Right rotation    Left rotation     (Blank rows = not tested)   LOWER EXTREMITY ROM:   WFL for patient   Active  Right eval Left eval  Hip flexion      Hip extension      Hip abduction      Hip adduction      Hip internal rotation      Hip external rotation      Knee flexion      Knee extension      Ankle dorsiflexion      Ankle plantarflexion      Ankle inversion      Ankle eversion       (Blank rows = not tested)   LOWER EXTREMITY MMT:     MMT Right eval Left eval  Hip flexion 4- 4-  Hip extension 4- 4-  Hip abduction 4- 4-  Hip adduction      Hip internal rotation      Hip external rotation      Knee flexion      Knee extension 4- 4-  Ankle dorsiflexion      Ankle plantarflexion 4- 4-  Ankle inversion  Ankle eversion       (Blank rows = not tested)   LUMBAR SPECIAL TESTS:  Straight leg raise test: Negative and Slump test: Negative   FUNCTIONAL TESTS:  30 seconds chair stand test 11 reps with UE support 134/70 98% O2 sats baseline, dropping to 88% post exertion but returning to baseline within 1 minute   GAIT: Distance walked: 254f Assistive device utilized:  Single point cane Level of assistance: SBA Comments: slow cadence   2MWT 225 ft with cane   TODAY'S TREATMENT:     [facility] Adult PT Treatment:                                                DATE: 03/16/22 Vitals pre session: HR 66, SpO2 96% RA, BP 155/74 (difficulty relaxing arm, talking) Therapeutic Exercise: Seated marches 4# B LE 2x10 cues for form and pacing  4# LAQ x10 B LE cues for pacing GTB hip abduction at counter 2x10 cues for appropriate setup  GTB hip extension at counter 2x10 cues for posture, reduced compensation at knee  Heel raises x10  Lateral step ups 6inch 2x5 BLE cues for setup and posture 6# bicep curl 2x10 B UE  Fwd retro stepping at counter 6 laps Sidestpping x4 laps  HEP update + handout Post session BP 122/64, 72 HR 100% SpO2 RA   [facility] Adult PT Treatment:                                                DATE: 03/12/22 Pre session vitals: BP 145/68 HR 71 SpO2 95% RA; seated    Therapeutic Exercise: Standing marches unilat UE support alternating, 2x10 each LE cues for pacing and full ROM  4 inch step ups fwd, unilat UE support x5 each LE; 6 inch step ups fwd x5 B LE, 8 inch step x5 BLE cues for pacing, posture, monitoring HR/SpO2 throughout and stable 5# bicep curl B 2x10 cues for form and pacing  2.5# seated march x12 B LE cues for posture, reduced trunk lean, and control Post session vitals: HR 74, SpO2 96%RA, BP 128/69 seated   [facility] Adult PT Treatment:                                                DATE: 03/10/22 Pre session vitals: HR 70, SpO2 97% RA, BP 132/63; monitored and stable throughout Therapeutic Exercise: Seated bicep curls 3# B 2x15 cues for pacing  Supine bridge 2x15 cues for form and pacing 3# SLR B 2x10 cues for form, pacing, and muscular control  Standing heel raises 2x15  Fwd/retro stepping 2x5 laps at counter, no UE support, cues for fwd velocity and posture with retro stepping  3# LAQ x15 B LE cues for pacing       PATIENT EDUCATION:   Education details: rationale for interventions, HEP Person educated: Patient  Education method: Explanation Education comprehension: verbalized understanding and needs further education   HOME EXERCISE PROGRAM: Access Code: [email or web address] URL: [email or web address] Date: 03/16/2022 Prepared by: DEnis Slipper Exercises - Supine Bridge  - 2 x daily -  5 x weekly - 1 sets - 10 reps - Supine March  - 2 x daily - 5 x weekly - 1 sets - 10 reps - Sit to Stand Without Arm Support  - 2 x daily - 5 x weekly - 1 sets - Side Stepping with Counter Support  - 1 x daily - 7 x weekly - 2 sets - 10 reps   ASSESSMENT:   CLINICAL IMPRESSION:  Pt arrives w/o complaint, continues to progress well. Today continuing to progress resistance/volume where appropriate with strengthening program emphasizing major muscle groups. Pt tolerates well without adverse event, vitals monitored intermittently and stable throughout. Pt departs today's session in no acute distress, all voiced questions/concerns addressed appropriately from PT perspective.     OBJECTIVE IMPAIRMENTS: Abnormal gait, cardiopulmonary status limiting activity, decreased activity tolerance, decreased endurance, decreased knowledge of condition, decreased mobility, difficulty walking, decreased strength, and postural dysfunction.    ACTIVITY LIMITATIONS: carrying, lifting, stairs, and locomotion level   PARTICIPATION LIMITATIONS: driving and community activity   PERSONAL FACTORS: Age, Fitness, and 1 comorbidity: CHF  are also affecting patient's functional outcome.    REHAB POTENTIAL: Good   CLINICAL DECISION MAKING: Evolving/moderate complexity   EVALUATION COMPLEXITY: Low     GOALS: Goals reviewed with patient? No   SHORT TERM GOALS: Target date: 04/10/2022   Patient to demonstrate independence in HEP  Baseline: TBD; [id number] Goal status: INITIAL   2.  Patient to negotiate 8 stairs with single handrail with most appropriate  pattern Baseline: 4 steps with single rail and cane Goal status: INITIAL     LONG TERM GOALS: Target date: 03/27/2022     Obtain and assess FOTO score against predicted value Baseline: TBD Goal status: INITIAL   2.  Increase BLE strength to 4/5 Baseline:  MMT Right eval Left eval  Hip flexion 4- 4-  Hip extension 4- 4-  Hip abduction 4- 4-  Hip adduction      Hip internal rotation      Hip external rotation      Knee flexion      Knee extension 4- 4-  Ankle dorsiflexion      Ankle plantarflexion 4- 4-    Goal status: INITIAL   3.  237f with 2MWT and cane Baseline: 225 ft with cane Goal status: Initial   4.  Patient able to negotiate 16 steps with single rail and cane Baseline: 4 steps Goal status: INITIAL     PLAN:   PT FREQUENCY: 2x/week   PT DURATION: 4 weeks   PLANNED INTERVENTIONS: Therapeutic exercises, Therapeutic activity, Neuromuscular re-education, Balance training, Gait training, Patient/Family education, Self Care, Joint mobilization, Stair training, DME instructions, and Re-evaluation.   PLAN FOR NEXT SESSION: stair training, transfers, continue w/ general strengthening.  Monitor vitals   DLeeroy ChaPT, DPT 03/16/2022 12:33 PM

## 2022-03-18 ENCOUNTER — Ambulatory Visit: Payer: PPO | Attending: Interventional Cardiology | Admitting: Interventional Cardiology

## 2022-03-18 ENCOUNTER — Ambulatory Visit: Payer: PPO

## 2022-03-18 ENCOUNTER — Encounter: Payer: Self-pay | Admitting: Interventional Cardiology

## 2022-03-18 VITALS — BP 130/68 | HR 68 | Ht 64.0 in | Wt 181.4 lb

## 2022-03-18 DIAGNOSIS — I10 Essential (primary) hypertension: Secondary | ICD-10-CM | POA: Diagnosis not present

## 2022-03-18 DIAGNOSIS — N184 Chronic kidney disease, stage 4 (severe): Secondary | ICD-10-CM

## 2022-03-18 DIAGNOSIS — M6281 Muscle weakness (generalized): Secondary | ICD-10-CM | POA: Diagnosis not present

## 2022-03-18 DIAGNOSIS — I5022 Chronic systolic (congestive) heart failure: Secondary | ICD-10-CM | POA: Diagnosis not present

## 2022-03-18 DIAGNOSIS — I2510 Atherosclerotic heart disease of native coronary artery without angina pectoris: Secondary | ICD-10-CM

## 2022-03-18 DIAGNOSIS — R2689 Other abnormalities of gait and mobility: Secondary | ICD-10-CM

## 2022-03-18 DIAGNOSIS — I509 Heart failure, unspecified: Secondary | ICD-10-CM

## 2022-03-18 MED ORDER — NITROGLYCERIN 0.4 MG SL SUBL: 0.4000 mg | SUBLINGUAL_TABLET | SUBLINGUAL | 6 refills | Status: DC | PRN

## 2022-03-18 NOTE — Therapy (Signed)
OUTPATIENT PHYSICAL THERAPY TREATMENT NOTE/DC SUMMARY   Patient Name: Daniel Warner MRN: [id number] DOB:1940/10/22, 82 y.o., male Today's Date: 03/18/2022  PCP: Kathalene Frames, MD  REFERRING PROVIDER: Tawni Millers, MD   PHYSICAL THERAPY DISCHARGE SUMMARY  Visits from Start of Care: 7  Current functional level related to goals / functional outcomes: Goals met   Remaining deficits: endurance   Education / Equipment: HEP   Patient agrees to discharge. Patient goals were met. Patient is being discharged due to being pleased with the current functional level.  END OF SESSION:   PT End of Session - 03/18/22 1132     Visit Number 7    Number of Visits 8    Date for PT Re-Evaluation 04/24/22    Authorization Type HTA    PT Start Time 1131    PT Stop Time 1210    PT Time Calculation (min) 39 min    Activity Tolerance Patient tolerated treatment well    Behavior During Therapy WFL for tasks assessed/performed                 Past Medical History:  Diagnosis Date   Carotid artery disease ([place])    CKD (chronic kidney disease), stage IV ([place])    Diabetes mellitus type 2 with peripheral artery disease ([place])    Discolored skin    Right leg, thinks it's from a bag that has rubbed against his leg for so long. Doppler 12/2010   GERD (gastroesophageal reflux disease)    Hypercholesterolemia    Hyperlipidemia    Numbness of toes    In the morning   Obesity    Peripheral arterial disease ([place])    Past Surgical History:  Procedure Laterality Date   PERIPHERAL VASCULAR CATHETERIZATION N/A 10/16/2014   Procedure: Abdominal Aortogram w/Lower Extremity;  Surgeon: Serafina Mitchell, MD;  Location: [facility];  Service: Cardiovascular;  Laterality: N/A;   RIGHT/LEFT HEART CATH AND CORONARY ANGIOGRAPHY N/A 02/16/2022   Procedure: RIGHT/LEFT HEART CATH AND CORONARY ANGIOGRAPHY;  Surgeon: Lorretta Harp, MD;  Location: [facility];  Service:  Cardiovascular;  Laterality: N/A;   TONSILLECTOMY     WISDOM TOOTH EXTRACTION     Patient Active Problem List   Diagnosis Date Noted   Pleural effusion 02/18/2022   Coronary artery disease due to lipid rich plaque 02/18/2022   AKI (acute kidney injury) ([place]) 02/18/2022   Acute on chronic combined systolic and diastolic CHF (congestive heart failure) ([place]) 02/13/2022   CHF (congestive heart failure) ([place]) 02/12/2022   Acute on chronic systolic (congestive) heart failure ([place]) 02/11/2022   Type 2 diabetes mellitus with hyperlipidemia ([place]) 12/10/2021   Diabetic retinopathy ([place]) 03/07/2021   Hyperparathyroidism due to renal insufficiency ([place]) 03/07/2021   Peripheral venous insufficiency 03/07/2021   Anemia 05/21/2020   Chronic diastolic heart failure ([place]) 05/21/2020   Diabetic nephropathy ([place]) 05/21/2020   Hardening of the aorta (main artery of the heart) ([place]) 05/21/2020   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[place]) 05/21/2020   Pure hypercholesterolemia 05/21/2020   Stricture and stenosis of esophagus 05/21/2020   Diabetes ([place]) 04/08/2018   OSA (obstructive sleep apnea) 04/08/2018   Chronic kidney disease (CKD), stage IV (severe) (HCC) 04/08/2018   GERD (gastroesophageal reflux disease) 04/08/2018   PAD (peripheral artery disease) ([place]) 10/16/2014   Essential hypertension 04/11/2013   Mixed hyperlipidemia 04/11/2013   Occlusion and stenosis of carotid artery without mention of cerebral infarction 04/11/2013  Class 1 obesity 04/11/2013   Nonspecific abnormal electrocardiogram (ECG) (EKG) 04/11/2013    REFERRING DIAG: R53.1 (ICD-10-CM) - Weakness    THERAPY DIAG: Muscle weakness (generalized)   Other abnormalities of gait and mobility   Acute on chronic congestive heart failure, unspecified heart failure type [facility])   Rationale for Evaluation and Treatment Rehabilitation  PERTINENT HISTORY: 82 yo male 02/11/22 c/o SOB, BLE edema, showed pleural effusions on  CXR, sent to ED for diuresis by OP MD due to acute CHF. Received cardiac cath 2/5. PMH CHF, CKD, DM, HTN    PRECAUTIONS: Other: cardiac   SUBJECTIVE:                                                                                                                                                                                      SUBJECTIVE STATEMENT: No pain or deficits reported.  Saw cardiologist today, no changes to plan/meds  PAIN:  Are you having pain? No   OBJECTIVE: (objective measures completed at initial evaluation unless otherwise dated)   DIAGNOSTIC FINDINGS:  none   PATIENT SURVEYS:  Deferred due to time restraints   SCREENING FOR RED FLAGS: N/a    COGNITION: Overall cognitive status: Within functional limits for tasks assessed                   MUSCLE LENGTH: N/T   POSTURE: rounded shoulders, forward head, decreased lumbar lordosis, decreased thoracic kyphosis, and flexed trunk    PALPATION: deferred   LUMBAR ROM: WFL for patient   AROM eval  Flexion    Extension    Right lateral flexion    Left lateral flexion    Right rotation    Left rotation     (Blank rows = not tested)   LOWER EXTREMITY ROM:   WFL for patient   Active  Right eval Left eval  Hip flexion      Hip extension      Hip abduction      Hip adduction      Hip internal rotation      Hip external rotation      Knee flexion      Knee extension      Ankle dorsiflexion      Ankle plantarflexion      Ankle inversion      Ankle eversion       (Blank rows = not tested)   LOWER EXTREMITY MMT:     MMT Right eval Left eval B 03/18/22  Hip flexion 4- 4- 4  Hip extension 4- 4- 4  Hip abduction 4- 4- 4  Hip adduction  Hip internal rotation       Hip external rotation       Knee flexion       Knee extension 4- 4- 4  Ankle dorsiflexion       Ankle plantarflexion 4- 4- 4  Ankle inversion       Ankle eversion        (Blank rows = not tested)   LUMBAR SPECIAL TESTS:   Straight leg raise test: Negative and Slump test: Negative   FUNCTIONAL TESTS:  30 seconds chair stand test 11 reps with UE support 134/70 98% O2 sats baseline, dropping to 88% post exertion but returning to baseline within 1 minute   GAIT: Distance walked: 284f Assistive device utilized: Single point cane Level of assistance: SBA Comments: slow cadence   2MWT 225 ft with cane   TODAY'S TREATMENT:    [facility] Adult PT Treatment:                                                DATE: 03/18/22 HR 64 )2 sats 96% BP LUE 131/67 Therapeutic Exercise: 2095fambulation w/o need of AD Negotiation of 16 steps with single rail and step through pattern 5s STS arms crossed from high table Sidestepping at counter 3 trips with no UE support Forward and backward walking at counter 4x with no UE support TUG 19s average over 2 trials Seated FAQs 5# 15/15 Seated march 15/15 5# Seated OH reach 3# 15/15 Seated bicep curls 5# 15/15   [facility] Adult PT Treatment:                                                DATE: 03/16/22 Vitals pre session: HR 66, SpO2 96% RA, BP 155/74 (difficulty relaxing arm, talking) Therapeutic Exercise: Seated marches 4# B LE 2x10 cues for form and pacing  4# LAQ x10 B LE cues for pacing GTB hip abduction at counter 2x10 cues for appropriate setup  GTB hip extension at counter 2x10 cues for posture, reduced compensation at knee  Heel raises x10  Lateral step ups 6inch 2x5 BLE cues for setup and posture 6# bicep curl 2x10 B UE  Fwd retro stepping at counter 6 laps Sidestpping x4 laps  HEP update + handout Post session BP 122/64, 72 HR 100% SpO2 RA   [facility] Adult PT Treatment:                                                DATE: 03/12/22 Pre session vitals: BP 145/68 HR 71 SpO2 95% RA; seated    Therapeutic Exercise: Standing marches unilat UE support alternating, 2x10 each LE cues for pacing and full ROM  4 inch step ups fwd, unilat UE support x5 each LE; 6 inch step ups fwd x5  B LE, 8 inch step x5 BLE cues for pacing, posture, monitoring HR/SpO2 throughout and stable 5# bicep curl B 2x10 cues for form and pacing  2.5# seated march x12 B LE cues for posture, reduced trunk lean, and control Post session vitals: HR 74, SpO2 96%RA, BP 128/69 seated  [place] Adult PT Treatment:                                                DATE: 03/10/22 Pre session vitals: HR 70, SpO2 97% RA, BP 132/63; monitored and stable throughout Therapeutic Exercise: Seated bicep curls 3# B 2x15 cues for pacing  Supine bridge 2x15 cues for form and pacing 3# SLR B 2x10 cues for form, pacing, and muscular control  Standing heel raises 2x15  Fwd/retro stepping 2x5 laps at counter, no UE support, cues for fwd velocity and posture with retro stepping  3# LAQ x15 B LE cues for pacing       PATIENT EDUCATION:  Education details: rationale for interventions, HEP Person educated: Patient  Education method: Explanation Education comprehension: verbalized understanding and needs further education   HOME EXERCISE PROGRAM: Access Code: [email or web address] URL: [email or web address] Date: 03/16/2022 Prepared by: Enis Slipper  Exercises - Supine Bridge  - 2 x daily - 5 x weekly - 1 sets - 10 reps - Supine March  - 2 x daily - 5 x weekly - 1 sets - 10 reps - Sit to Stand Without Arm Support  - 2 x daily - 5 x weekly - 1 sets - Side Stepping with Counter Support  - 1 x daily - 7 x weekly - 2 sets - 10 reps   ASSESSMENT:   CLINICAL IMPRESSION: All rehab goals met, patient has returned to driving, he had a cardiology appointment earlier today, no changes to plan recommended and he will return for f/u in 4 months.  Vital sign monitored during session and metrics changed little from baseline. Able to negotiate full flight of stairs with step through pattern, single rail w/o drop in O2 sats   OBJECTIVE IMPAIRMENTS: Abnormal gait, cardiopulmonary status limiting activity, decreased activity  tolerance, decreased endurance, decreased knowledge of condition, decreased mobility, difficulty walking, decreased strength, and postural dysfunction.    ACTIVITY LIMITATIONS: carrying, lifting, stairs, and locomotion level   PARTICIPATION LIMITATIONS: driving and community activity   PERSONAL FACTORS: Age, Fitness, and 1 comorbidity: CHF  are also affecting patient's functional outcome.    REHAB POTENTIAL: Good   CLINICAL DECISION MAKING: Evolving/moderate complexity   EVALUATION COMPLEXITY: Low     GOALS: Goals reviewed with patient? No   SHORT TERM GOALS: Target date: 04/10/2022   Patient to demonstrate independence in HEP  Baseline: TBD; [email or web address] Goal status: Met   2.  Patient to negotiate 8 stairs with single handrail with most appropriate pattern Baseline: 4 steps with single rail and cane; 03/18/22 Negotiation of 16 steps with single rail and step through pattern Goal status: Met     LONG TERM GOALS: Target date: 03/27/2022     Obtain and assess FOTO score against predicted value Baseline: NT Goal status: NT   2.  Increase BLE strength to 4/5 Baseline:  MMT Right eval Left eval B 03/18/22  Hip flexion 4- 4- 4  Hip extension 4- 4- 4  Hip abduction 4- 4- 4  Hip adduction       Hip internal rotation       Hip external rotation       Knee flexion       Knee extension 4- 4- 4  Ankle dorsiflexion       Ankle plantarflexion 4- 4- 4    Goal status: Met  3.  224f with 2MWT and cane Baseline: 225 ft with cane; 03/18/22 2027fw/o need of AD Goal status: Met   4.  Patient able to negotiate 16 steps with single rail and cane Baseline: 4 steps; 03/18/22 Negotiation of 16 steps with single rail and step through pattern Goal status: Met     PLAN:   PT FREQUENCY: 2x/week   PT DURATION: 4 weeks   PLANNED INTERVENTIONS: Therapeutic exercises, Therapeutic activity, Neuromuscular re-education, Balance training, Gait training, Patient/Family education, Self Care, Joint  mobilization, Stair training, DME instructions, and Re-evaluation.   PLAN FOR NEXT SESSION: stair training, transfers, continue w/ general strengthening.  Monitor vitals   JeLeroy SeaT  03/18/2022 1:31 PM

## 2022-03-18 NOTE — Patient Instructions (Signed)
Medication Instructions:  Your physician recommends that you continue on your current medications as directed. Please refer to the Current Medication list given to you today.  *If you need a refill on your cardiac medications before your next appointment, please call your pharmacy*   Lab Work: none If you have labs (blood work) drawn today and your tests are completely normal, you will receive your results only by: [place] (if you have MyChart) OR A paper copy in the mail If you have any lab test that is abnormal or we need to change your treatment, we will call you to review the results.   Testing/Procedures: Your physician has requested that you have an echocardiogram. Echocardiography is a painless test that uses sound waves to create images of your heart. It provides your doctor with information about the size and shape of your heart and how well your heart's chambers and valves are working. This procedure takes approximately one hour. There are no restrictions for this procedure. Please do NOT wear cologne, perfume, aftershave, or lotions (deodorant is allowed). Please arrive 15 minutes prior to your appointment time. To be done in June 2024   Follow-Up: At [facility], you and your health needs are our priority.  As part of our continuing mission to provide you with exceptional heart care, we have created designated Provider Care Teams.  These Care Teams include your primary Cardiologist (physician) and Advanced Practice Providers (APPs -  Physician Assistants and Nurse Practitioners) who all work together to provide you with the care you need, when you need it.  We recommend signing up for the patient portal called "MyChart".  Sign up information is provided on this After Visit Summary.  MyChart is used to connect with patients for Virtual Visits (Telemedicine).  Patients are able to view lab/test results, encounter notes, upcoming appointments, etc.  Non-urgent  messages can be sent to your provider as well.   To learn more about what you can do with MyChart, go to [email or web address].    Your next appointment:   6 month(s)  Provider:   Nicholes Rough, PA-C, Melina Copa, PA-C, Ambrose Pancoast, NP, Ermalinda Barrios, PA-C, Christen Bame, NP, or Richardson Dopp, PA-C         Other Instructions

## 2022-03-18 NOTE — Progress Notes (Signed)
Cardiology Office Note   Date:  03/18/2022   ID:  Daniel Warner, DOB 30-Oct-1940, MRN [id number]  PCP:  Kathalene Frames, MD    No chief complaint on file.  Chronic systolic heart failure  Wt Readings from Last 3 Encounters:  03/18/22 181 lb 6.4 oz (82.3 kg)  03/11/22 187 lb 3.2 oz (84.9 kg)  02/22/22 200 lb 9.9 oz (91 kg)       History of Present Illness: Daniel Warner is a 82 y.o. male who is being seen today for the evaluation of HFrEF at the request of Kathalene Frames, *.   Prior records show: "with a history of  CKD Stage IV, HTN, HLD, and chronic HFrEF.    Admitted 02/11/22 with A/C HFrEF. Diuresed with IV lasix and transitioned to torsemide 40 mg daily. Diuresed over 20 pounds. Had cath with Ost CX-prox CX 99%/Ost LAD-Prox LAD, preserved cardiac output and low filling pressures. Medical management recommended. Echo showed EF 25-30%.  Started on GDMT. Discharged 02/22/22.    At visit with heart failure and February 2024: "He presents with his wife. Overall feeling fine. Denies SOB/PND/Orthopnea. Limited by leg fatigue. No chest pain. Appetite ok. Following low sodium diet.  No fever or chills. Weight at home 186--->181 pounds. Taking all medications. "   Cardiac Testing  Cath 02/2022  Ost Cx to Prox Cx lesion is 99% stenosed.  Ost LAD to Prox LAD lesion is 90% stenosed.  RA 6 PA 62/21 (37) PCWP 12 CO 7.8  CI 3.9    Echo 02/12/2022   1. Left ventricular ejection fraction, by estimation, is 25 to 30%. The  left ventricle has severely decreased function. The left ventricle  demonstrates global hypokinesis. Left ventricular diastolic parameters are  consistent with Grade II diastolic  dysfunction (pseudonormalization).   2. Right ventricular systolic function is moderately reduced. The right  ventricular size is normal. There is mildly elevated pulmonary artery  systolic pressure.   3. Left atrial size was moderately dilated.   4. Right atrial size  was moderately dilated.   5. The mitral valve is normal in structure. Trivial mitral valve  regurgitation. No evidence of mitral stenosis.   6. The aortic valve is tricuspid. There is mild calcification of the  aortic valve. Aortic valve regurgitation is not visualized. Aortic valve  sclerosis/calcification is present, without any evidence of aortic  stenosis. "  He has felt well since leaving the hospital.  He has been diligent about avoiding excessive salt. Denies : Chest pain. Dizziness. Leg edema. Nitroglycerin use. Orthopnea. Palpitations. Paroxysmal nocturnal dyspnea. Shortness of breath. Syncope.    He does not have nitroglycerin at home.   Past Medical History:  Diagnosis Date   Carotid artery disease ([place])    CKD (chronic kidney disease), stage IV (HCC)    Diabetes mellitus type 2 with peripheral artery disease ([place])    Discolored skin    Right leg, thinks it's from a bag that has rubbed against his leg for so long. Doppler 12/2010   GERD (gastroesophageal reflux disease)    Hypercholesterolemia    Hyperlipidemia    Numbness of toes    In the morning   Obesity    Peripheral arterial disease ([place])     Past Surgical History:  Procedure Laterality Date   PERIPHERAL VASCULAR CATHETERIZATION N/A 10/16/2014   Procedure: Abdominal Aortogram w/Lower Extremity;  Surgeon: Serafina Mitchell, MD;  Location: [facility];  Service: Cardiovascular;  Laterality: N/A;   RIGHT/LEFT HEART CATH AND CORONARY ANGIOGRAPHY N/A 02/16/2022   Procedure: RIGHT/LEFT HEART CATH AND CORONARY ANGIOGRAPHY;  Surgeon: Lorretta Harp, MD;  Location: [facility];  Service: Cardiovascular;  Laterality: N/A;   TONSILLECTOMY     WISDOM TOOTH EXTRACTION       Current Outpatient Medications  Medication Sig Dispense Refill   aspirin EC 81 MG tablet Take 1 tablet (81 mg total) by mouth daily. Swallow whole. 30 tablet 12   atorvastatin (LIPITOR) 80 MG tablet Take 80 mg by mouth every morning.      calcitRIOL (ROCALTROL) 0.25 MCG capsule Take 0.25 mcg by mouth daily.  0   carvedilol (COREG) 3.125 MG tablet Take 1 tablet (3.125 mg total) by mouth 2 (two) times daily with a meal. 60 tablet 0   clopidogrel (PLAVIX) 75 MG tablet Take 1 tablet (75 mg total) by mouth daily with breakfast. 30 tablet 1   Continuous Blood Gluc Receiver (FREESTYLE LIBRE 14 DAY READER) DEVI      Continuous Blood Gluc Sensor (FREESTYLE LIBRE 14 DAY SENSOR) MISC      hydrALAZINE (APRESOLINE) 10 MG tablet Take 1 tablet (10 mg total) by mouth in the morning and at bedtime. 60 tablet 0   isosorbide mononitrate (IMDUR) 30 MG 24 hr tablet Take 0.5 tablets (15 mg total) by mouth daily. 15 tablet 0   torsemide 40 MG TABS Take 40 mg by mouth daily. 30 tablet 0   TRULICITY 1.5 0000000 SOPN Inject 1.5 mg into the skin once a week. Saturday or Sunday     No current facility-administered medications for this visit.    Allergies:   Losartan potassium and Lisinopril    Social History:  The patient  reports that he has never smoked. He has never used smokeless tobacco. He reports that he does not drink alcohol and does not use drugs.   Family History:  The patient's family history includes Heart disease in his cousin and paternal aunt; Irritable bowel syndrome in his mother; Stroke in his father.    ROS:  Please see the history of present illness.   Otherwise, review of systems are positive for weight loss.   All other systems are reviewed and negative.    PHYSICAL EXAM: VS:  BP 130/68   Pulse 68   Ht '5\' 4"'$  (1.626 m)   Wt 181 lb 6.4 oz (82.3 kg)   SpO2 98%   BMI 31.14 kg/m  , BMI Body mass index is 31.14 kg/m. GEN: frail, in no acute distress HEENT: normal Neck: no JVD, carotid bruits, or masses Cardiac: RRR; no murmurs, rubs, or gallops,no edema  Respiratory:  clear to auscultation bilaterally, normal work of breathing GI: soft, nontender, nondistended, + BS MS: no deformity or atrophy Skin: warm and dry, no  rash Neuro:  Strength and sensation are intact Psych: euthymic mood, full affect    Recent Labs: 02/13/2022: ALT 13 02/14/2022: TSH 1.431 02/17/2022: Hemoglobin 10.8; Platelets 234 02/19/2022: Magnesium 2.0 02/20/2022: B Natriuretic Peptide 1,464.6 03/11/2022: BUN 33; Creatinine, Ser 2.12; Potassium 4.2; Sodium 137   Lipid Panel    Component Value Date/Time   CHOL 104 02/14/2022 0059   TRIG 49 02/14/2022 0059   HDL 33 (L) 02/14/2022 0059   CHOLHDL 3.2 02/14/2022 0059   VLDL 10 02/14/2022 0059   LDLCALC NOT CALCULATED 02/14/2022 0059     Other studies Reviewed: Additional studies/ records that were reviewed today with results demonstrating: Hospital records reviewed.  Labs reviewed.   ASSESSMENT AND PLAN:  Chronic systolic heart failure:  No signs of volume overload.  Able to lie flat.  Recheck echo in June 2024.  Continue current medical therapy.  He was seen once in the heart failure clinic. CAD: No angina.  Medical therapy was recommended. Calcified CAD at the ostium of the circ and LAD.  Very short left main.  Would likely take a fair amount of contrast to do an intervention as he may need some calcium modification. CKD stage IV: Avoid nephrotoxins, stay well hydrated.  Stable.  Dose of heart failure medicine somewhat restricted. HTN: The current medical regimen is effective;  continue present plan and medications.    Current medicines are reviewed at length with the patient today.  The patient concerns regarding his medicines were addressed.  The following changes have been made:  No change  Labs/ tests ordered today include:  No orders of the defined types were placed in this encounter.   Recommend 150 minutes/week of aerobic exercise Low fat, low carb, high fiber diet recommended  Disposition:   FU in 6 months with APP   Signed, Larae Grooms, MD  03/18/2022 10:02 AM    [facility] [place], [place], [place]  [address] Phone: [phone]; Fax: [phone]

## 2022-03-30 ENCOUNTER — Ambulatory Visit (INDEPENDENT_AMBULATORY_CARE_PROVIDER_SITE_OTHER): Payer: HMO | Admitting: Podiatry

## 2022-03-30 VITALS — BP 122/54

## 2022-03-30 DIAGNOSIS — M79675 Pain in left toe(s): Secondary | ICD-10-CM | POA: Diagnosis not present

## 2022-03-30 DIAGNOSIS — N184 Chronic kidney disease, stage 4 (severe): Secondary | ICD-10-CM | POA: Diagnosis not present

## 2022-03-30 DIAGNOSIS — E1122 Type 2 diabetes mellitus with diabetic chronic kidney disease: Secondary | ICD-10-CM | POA: Diagnosis not present

## 2022-03-30 DIAGNOSIS — B351 Tinea unguium: Secondary | ICD-10-CM | POA: Diagnosis not present

## 2022-03-30 DIAGNOSIS — M79674 Pain in right toe(s): Secondary | ICD-10-CM | POA: Diagnosis not present

## 2022-03-30 DIAGNOSIS — I739 Peripheral vascular disease, unspecified: Secondary | ICD-10-CM | POA: Diagnosis not present

## 2022-03-30 NOTE — Progress Notes (Unsigned)
  Subjective:  Patient ID: Daniel Warner, male    DOB: May 23, 1940,  MRN: [id number]  Daniel Warner presents to clinic today for {jgcomplaint:23593} No chief complaint on file.  New problem(s): None. {jgcomplaint:23593}  PCP is Kathalene Frames, MD.  Allergies  Allergen Reactions   Losartan Potassium     Other reaction(s): itching and hand swelling  Other Reaction(s): itching and hand swelling   Lisinopril     Other reaction(s): Cough    Review of Systems: Negative except as noted in the HPI.  Objective: No changes noted in today's physical examination. There were no vitals filed for this visit. Daniel Warner is a pleasant 82 y.o. male {jgbodyhabitus:24098} AAO x 3.  Vascular Examination: CFT <4 seconds b/l. DP pulses diminished b/l. PT pulses diminished b/l. Digital hair absent. Skin temperature gradient warm to warm b/l. No ischemia or gangrene. No cyanosis or clubbing noted b/l. Patient wearing compression hose on today's visit. +2 pitting edema BLE.   Neurological Examination: Sensation grossly intact b/l with 10 gram monofilament. Vibratory sensation intact b/l.   Dermatological Examination: Pedal skin thin, shiny and atrophic b/l. No open wounds. No interdigital macerations. Toenails 1-5 b/l thick, discolored, elongated with subungual debris and pain on dorsal palpation.    Musculoskeletal Examination: Muscle strength 5/5 to b/l LE. No pain, crepitus or joint limitation noted with ROM bilateral LE. No gross bony deformities bilaterally. Pes planus deformity noted bilateral LE.  Radiographs: None  Assessment/Plan: 1. Pain due to onychomycosis of toenails of both feet   2. PAD (peripheral artery disease) ([place])   3. Type 2 diabetes mellitus with stage 4 chronic kidney disease, without long-term current use of insulin (HCC)     No orders of the defined types were placed in this encounter.   None {Jgplan:23602::"-Patient/POA to call should there be  question/concern in the interim."}   Return in about 3 months (around 06/30/2022).  Marzetta Board, DPM

## 2022-03-31 ENCOUNTER — Encounter: Payer: Self-pay | Admitting: Podiatry

## 2022-03-31 DIAGNOSIS — N184 Chronic kidney disease, stage 4 (severe): Secondary | ICD-10-CM | POA: Diagnosis not present

## 2022-03-31 DIAGNOSIS — E1142 Type 2 diabetes mellitus with diabetic polyneuropathy: Secondary | ICD-10-CM | POA: Diagnosis not present

## 2022-03-31 DIAGNOSIS — I5032 Chronic diastolic (congestive) heart failure: Secondary | ICD-10-CM | POA: Diagnosis not present

## 2022-03-31 DIAGNOSIS — N2581 Secondary hyperparathyroidism of renal origin: Secondary | ICD-10-CM | POA: Diagnosis not present

## 2022-03-31 DIAGNOSIS — I129 Hypertensive chronic kidney disease with stage 1 through stage 4 chronic kidney disease, or unspecified chronic kidney disease: Secondary | ICD-10-CM | POA: Diagnosis not present

## 2022-03-31 DIAGNOSIS — E7800 Pure hypercholesterolemia, unspecified: Secondary | ICD-10-CM | POA: Diagnosis not present

## 2022-03-31 DIAGNOSIS — I25119 Atherosclerotic heart disease of native coronary artery with unspecified angina pectoris: Secondary | ICD-10-CM | POA: Diagnosis not present

## 2022-04-06 DIAGNOSIS — E785 Hyperlipidemia, unspecified: Secondary | ICD-10-CM | POA: Diagnosis not present

## 2022-04-06 DIAGNOSIS — N184 Chronic kidney disease, stage 4 (severe): Secondary | ICD-10-CM | POA: Diagnosis not present

## 2022-04-06 DIAGNOSIS — D631 Anemia in chronic kidney disease: Secondary | ICD-10-CM | POA: Diagnosis not present

## 2022-04-06 DIAGNOSIS — Z0000 Encounter for general adult medical examination without abnormal findings: Secondary | ICD-10-CM | POA: Diagnosis not present

## 2022-04-06 DIAGNOSIS — I129 Hypertensive chronic kidney disease with stage 1 through stage 4 chronic kidney disease, or unspecified chronic kidney disease: Secondary | ICD-10-CM | POA: Diagnosis not present

## 2022-04-06 DIAGNOSIS — N189 Chronic kidney disease, unspecified: Secondary | ICD-10-CM | POA: Diagnosis not present

## 2022-04-06 DIAGNOSIS — I5022 Chronic systolic (congestive) heart failure: Secondary | ICD-10-CM | POA: Diagnosis not present

## 2022-04-06 DIAGNOSIS — E1122 Type 2 diabetes mellitus with diabetic chronic kidney disease: Secondary | ICD-10-CM | POA: Diagnosis not present

## 2022-04-06 DIAGNOSIS — I739 Peripheral vascular disease, unspecified: Secondary | ICD-10-CM | POA: Diagnosis not present

## 2022-04-06 DIAGNOSIS — I2510 Atherosclerotic heart disease of native coronary artery without angina pectoris: Secondary | ICD-10-CM | POA: Diagnosis not present

## 2022-04-06 DIAGNOSIS — N2581 Secondary hyperparathyroidism of renal origin: Secondary | ICD-10-CM | POA: Diagnosis not present

## 2022-04-06 DIAGNOSIS — K219 Gastro-esophageal reflux disease without esophagitis: Secondary | ICD-10-CM | POA: Diagnosis not present

## 2022-04-06 DIAGNOSIS — E1142 Type 2 diabetes mellitus with diabetic polyneuropathy: Secondary | ICD-10-CM | POA: Diagnosis not present

## 2022-04-08 LAB — LAB REPORT - SCANNED
Creatinine, POC: 68.1 mg/dL
EGFR: 27

## 2022-04-30 DIAGNOSIS — E1142 Type 2 diabetes mellitus with diabetic polyneuropathy: Secondary | ICD-10-CM | POA: Diagnosis not present

## 2022-04-30 DIAGNOSIS — N2581 Secondary hyperparathyroidism of renal origin: Secondary | ICD-10-CM | POA: Diagnosis not present

## 2022-04-30 DIAGNOSIS — I129 Hypertensive chronic kidney disease with stage 1 through stage 4 chronic kidney disease, or unspecified chronic kidney disease: Secondary | ICD-10-CM | POA: Diagnosis not present

## 2022-04-30 DIAGNOSIS — I5032 Chronic diastolic (congestive) heart failure: Secondary | ICD-10-CM | POA: Diagnosis not present

## 2022-04-30 DIAGNOSIS — N184 Chronic kidney disease, stage 4 (severe): Secondary | ICD-10-CM | POA: Diagnosis not present

## 2022-04-30 DIAGNOSIS — E7800 Pure hypercholesterolemia, unspecified: Secondary | ICD-10-CM | POA: Diagnosis not present

## 2022-05-13 DIAGNOSIS — H2513 Age-related nuclear cataract, bilateral: Secondary | ICD-10-CM | POA: Diagnosis not present

## 2022-05-13 DIAGNOSIS — H401131 Primary open-angle glaucoma, bilateral, mild stage: Secondary | ICD-10-CM | POA: Diagnosis not present

## 2022-05-13 DIAGNOSIS — H52203 Unspecified astigmatism, bilateral: Secondary | ICD-10-CM | POA: Diagnosis not present

## 2022-05-13 DIAGNOSIS — E113293 Type 2 diabetes mellitus with mild nonproliferative diabetic retinopathy without macular edema, bilateral: Secondary | ICD-10-CM | POA: Diagnosis not present

## 2022-06-23 ENCOUNTER — Ambulatory Visit (HOSPITAL_COMMUNITY): Payer: PPO | Attending: Interventional Cardiology

## 2022-06-23 DIAGNOSIS — I5022 Chronic systolic (congestive) heart failure: Secondary | ICD-10-CM | POA: Diagnosis present

## 2022-06-23 LAB — ECHOCARDIOGRAM COMPLETE
Area-P 1/2: 2.89 cm2
S' Lateral: 4 cm

## 2022-06-23 MED ORDER — PERFLUTREN LIPID MICROSPHERE
1.0000 mL | INTRAVENOUS | Status: AC | PRN
  Administered 2022-06-23: 2 mL via INTRAVENOUS

## 2022-08-05 ENCOUNTER — Ambulatory Visit (INDEPENDENT_AMBULATORY_CARE_PROVIDER_SITE_OTHER): Payer: PPO | Admitting: Podiatry

## 2022-08-05 ENCOUNTER — Encounter: Payer: Self-pay | Admitting: Podiatry

## 2022-08-05 DIAGNOSIS — I739 Peripheral vascular disease, unspecified: Secondary | ICD-10-CM

## 2022-08-05 DIAGNOSIS — E1122 Type 2 diabetes mellitus with diabetic chronic kidney disease: Secondary | ICD-10-CM

## 2022-08-05 DIAGNOSIS — M79674 Pain in right toe(s): Secondary | ICD-10-CM

## 2022-08-05 DIAGNOSIS — B351 Tinea unguium: Secondary | ICD-10-CM

## 2022-08-05 DIAGNOSIS — N184 Chronic kidney disease, stage 4 (severe): Secondary | ICD-10-CM

## 2022-08-05 DIAGNOSIS — M79675 Pain in left toe(s): Secondary | ICD-10-CM | POA: Diagnosis not present

## 2022-08-13 NOTE — Progress Notes (Signed)
  Subjective:  Patient ID: Daniel Warner, male    DOB: 1940-04-12,  MRN: [id number]  Daniel Warner presents to clinic today for at risk foot care with h/o CKD and PAD and painful thick toenails that are difficult to trim. Pain interferes with ambulation. Aggravating factors include wearing enclosed shoe gear. Pain is relieved with periodic professional debridement.  Chief Complaint  Patient presents with   Diabetes    DFC BS- DIDN'T CHECK IT, ONLY CHECKS IT ONCE A WEEK A1C - 7 LVPCP - 06/2022   New problem(s): None.   PCP is Emilio Aspen, MD.  Allergies  Allergen Reactions   Losartan Potassium     Other reaction(s): itching and hand swelling  Other Reaction(s): itching and hand swelling   Lisinopril     Other reaction(s): Cough    Review of Systems: Negative except as noted in the HPI.  Objective: No changes noted in today's physical examination. There were no vitals filed for this visit. Daniel Warner is a pleasant 82 y.o. male in NAD. AAO x 3.  Vascular Examination: CFT <4 seconds b/l. DP pulses diminished b/l. PT pulses diminished b/l. Digital hair absent. Skin temperature gradient warm to warm b/l. No ischemia or gangrene. No cyanosis or clubbing noted b/l. Dependent edema noted b/l LE.   Neurological Examination: Sensation grossly intact b/l with 10 gram monofilament. Vibratory sensation intact b/l.   Dermatological Examination: Pedal skin thin, shiny and atrophic b/l. No open wounds. No interdigital macerations.   Toenails 1-5 b/l thick, discolored, elongated with subungual debris and pain on dorsal palpation.   No corns, calluses nor porokeratotic lesions noted.  Musculoskeletal Examination: Muscle strength 5/5 to all lower extremity muscle groups bilaterally. Pes planus deformity noted bilateral LE.  Radiographs: None  Last A1c:      Latest Ref Rng & Units 02/12/2022   11:56 AM  Hemoglobin A1C  Hemoglobin-A1c 4.8 - 5.6 % 5.6     Assessment/Plan: 1. Pain due to onychomycosis of toenails of both feet   2. PAD (peripheral artery disease) (HCC)   3. Type 2 diabetes mellitus with stage 4 chronic kidney disease, without long-term current use of insulin (HCC)     -Consent given for treatment as described below: -Examined patient. -Continue supportive shoe gear daily. -Toenails 1-5 b/l were debrided in length and girth with sterile nail nippers and dremel without iatrogenic bleeding.  -Patient/POA to call should there be question/concern in the interim.   Return in about 3 months (around 11/05/2022).  Freddie Breech, DPM

## 2022-09-14 NOTE — Progress Notes (Unsigned)
Cardiology Office Note:    Date:  09/15/2022  ID:  Daniel Warner, DOB 20-Jun-1940, MRN [id number] PCP: Emilio Aspen, MD  [facility] Providers Cardiologist:  Lance Muss, MD       Patient Profile:      (HFrEF) heart failure with reduced ejection fraction  TTE 02/12/22: EF 25-30  TTE 06/23/22: EF 40-45, no RWMA, Gr 1 DD, NL RVSF, mild LAE, mild AV Ca2+, RAP 3  Coronary artery disease  LHC 02/16/22: oLCx 99, oLAD 90 - Med Rx recommended  Chronic kidney disease stage IV Carotid artery disease [place] 01/24/16: Bilat 1-39 Peripheral arterial disease  S/p R SFA stent in 2016 (Dr. Arbie Cookey) Diabetes mellitus  Hypertension  RA [place] 03/08/17: limited R RA; L RA 1-59  Hyperlipidemia   Obesity         History of Present Illness:  Discussed the use of AI scribe software for clinical note transcription with the patient, who gave verbal consent to proceed.    82 year old male who returns for f/u of heart failure, coronary artery disease. He was last seen in March 2024. EF was previously 25-30%. A follow-up echocardiogram in June 2024 showed improved EF at 40-45%. He is here alone. The patient reports no new health issues since the last visit. He has occasional chest discomfort described as gas. It is associated with meals. It is not associated with physical activity and is relieved by belching. The patient denies experiencing chest pain, pressure, tightness, or symptoms suggestive of angina, such as jaw pain or arm pain. He also denies shortness of breath during activities and can walk up a flight of stairs without stopping if he held onto a rail. The patient reports no difficulty breathing when lying flat and denies any leg swelling. He reports no claudication symptoms related to his peripheral arterial disease.     ROS:  See HPI    Studies Reviewed:       Risk Assessment/Calculations:             Physical Exam:   VS:  BP 112/80   Pulse 64   Ht 5\' 5"  (1.651 m)   Wt 181 lb  (82.1 kg)   SpO2 98%   BMI 30.12 kg/m    Wt Readings from Last 3 Encounters:  09/15/22 181 lb (82.1 kg)  03/18/22 181 lb 6.4 oz (82.3 kg)  03/11/22 187 lb 3.2 oz (84.9 kg)    Constitutional:      Appearance: Healthy appearance. Not in distress.  Neck:     Vascular: No JVR.  Pulmonary:     Breath sounds: Normal breath sounds. No wheezing. No rales.  Cardiovascular:     Normal rate. Regular rhythm.     Murmurs: There is a grade 1/6 systolic murmur at the URSB.  Edema:    Peripheral edema absent.  Abdominal:     Palpations: Abdomen is soft.      Assessment and Plan:     Heart Failure with Reduced Ejection Fraction Improved EF from 25-30% to 40-45% on follow-up echocardiogram in 06/2022. Volume status is stable. NYHA Class 2-2b. No anginal symptoms. -Continue current medications: Coreg 3.125mg  twice daily, Hydralazine 10mg  twice daily, Imdur 15mg  daily, Torsemide 40mg  daily. -Check comprehensive metabolic panel (CMP) today. -Consider SGLT2 inhibitor in the future if GFR remains above 30.  Coronary Artery Disease Presented with non-STEMI in February 2024. Cardiac cath at that time demonstrated 99% stenosis in the ostial LCX and 90% stenosis in the ostial LAD.  Given the patient's age and chronic kidney disease, medical therapy was recommended over surgical intervention.  He is doing well on current Rx without anginal symptoms. -Continue dual antiplatelet therapy with Aspirin 81mg  daily and Plavix 75mg  daily. -Consider switching to single antiplatelet therapy (Plavix alone) at next visit. -Continue Lipitor (80 mg daily), Coreg (3.125 mg twice daily), and NTG prn chest pain.   Hyperlipidemia -Check CMP and direct LDL today. -Continue Lipitor 80mg  daily.  Hypertension Controlled. -Continue current medications: Carvedilol 3.125mg  twice daily, Hydralazine 10mg  twice daily, Imdur 15mg  daily.         Dispo:  Return in about 6 months (around 03/15/2023) for Routine Follow Up, w/ Dr.  Lynnette Caffey, or Tereso Newcomer, PA-C.  Signed, Tereso Newcomer, PA-C

## 2022-09-15 ENCOUNTER — Ambulatory Visit: Payer: PPO | Attending: Physician Assistant | Admitting: Physician Assistant

## 2022-09-15 ENCOUNTER — Encounter: Payer: Self-pay | Admitting: Physician Assistant

## 2022-09-15 VITALS — BP 112/80 | HR 64 | Ht 65.0 in | Wt 181.0 lb

## 2022-09-15 DIAGNOSIS — I5020 Unspecified systolic (congestive) heart failure: Secondary | ICD-10-CM | POA: Diagnosis not present

## 2022-09-15 DIAGNOSIS — I2583 Coronary atherosclerosis due to lipid rich plaque: Secondary | ICD-10-CM

## 2022-09-15 DIAGNOSIS — E782 Mixed hyperlipidemia: Secondary | ICD-10-CM | POA: Diagnosis not present

## 2022-09-15 DIAGNOSIS — I2510 Atherosclerotic heart disease of native coronary artery without angina pectoris: Secondary | ICD-10-CM | POA: Diagnosis not present

## 2022-09-15 DIAGNOSIS — I10 Essential (primary) hypertension: Secondary | ICD-10-CM | POA: Diagnosis not present

## 2022-09-15 NOTE — Patient Instructions (Signed)
Medication Instructions:  Your physician recommends that you continue on your current medications as directed. Please refer to the Current Medication list given to you today.  *If you need a refill on your cardiac medications before your next appointment, please call your pharmacy*   Lab Work: TODAY:  DIRECT LDL & CMET  If you have labs (blood work) drawn today and your tests are completely normal, you will receive your results only by: MyChart Message (if you have MyChart) OR A paper copy in the mail If you have any lab test that is abnormal or we need to change your treatment, we will call you to review the results.   Testing/Procedures: None ordered   Follow-Up: At [facility], you and your health needs are our priority.  As part of our continuing mission to provide you with exceptional heart care, we have created designated Provider Care Teams.  These Care Teams include your primary Cardiologist (physician) and Advanced Practice Providers (APPs -  Physician Assistants and Nurse Practitioners) who all work together to provide you with the care you need, when you need it.  We recommend signing up for the patient portal called "MyChart".  Sign up information is provided on this After Visit Summary.  MyChart is used to connect with patients for Virtual Visits (Telemedicine).  Patients are able to view lab/test results, encounter notes, upcoming appointments, etc.  Non-urgent messages can be sent to your provider as well.   To learn more about what you can do with MyChart, go to [email or web address].    Your next appointment:   6 month(s)  Provider:   Alverda Skeans, MD  or Tereso Newcomer, PA-C         Other Instructions

## 2022-09-16 LAB — COMPREHENSIVE METABOLIC PANEL
ALT: 12 IU/L (ref 0–44)
AST: 17 IU/L (ref 0–40)
Albumin: 4 g/dL (ref 3.7–4.7)
Alkaline Phosphatase: 113 IU/L (ref 44–121)
BUN/Creatinine Ratio: 18 (ref 10–24)
BUN: 38 mg/dL — ABNORMAL HIGH (ref 8–27)
Bilirubin Total: 0.3 mg/dL (ref 0.0–1.2)
CO2: 26 mmol/L (ref 20–29)
Calcium: 8.9 mg/dL (ref 8.6–10.2)
Chloride: 101 mmol/L (ref 96–106)
Creatinine, Ser: 2.15 mg/dL — ABNORMAL HIGH (ref 0.76–1.27)
Globulin, Total: 2.8 g/dL (ref 1.5–4.5)
Glucose: 102 mg/dL — ABNORMAL HIGH (ref 70–99)
Potassium: 4.7 mmol/L (ref 3.5–5.2)
Sodium: 142 mmol/L (ref 134–144)
Total Protein: 6.8 g/dL (ref 6.0–8.5)
eGFR: 30 mL/min/{1.73_m2} — ABNORMAL LOW (ref >59)

## 2022-09-16 LAB — LDL CHOLESTEROL, DIRECT: LDL Direct: 72 mg/dL (ref 0–99)

## 2022-09-29 ENCOUNTER — Encounter: Payer: Self-pay | Admitting: *Deleted

## 2022-11-11 ENCOUNTER — Ambulatory Visit (INDEPENDENT_AMBULATORY_CARE_PROVIDER_SITE_OTHER): Payer: PPO | Admitting: Podiatry

## 2022-11-11 ENCOUNTER — Encounter: Payer: Self-pay | Admitting: Podiatry

## 2022-11-11 DIAGNOSIS — E1122 Type 2 diabetes mellitus with diabetic chronic kidney disease: Secondary | ICD-10-CM

## 2022-11-11 DIAGNOSIS — M79674 Pain in right toe(s): Secondary | ICD-10-CM

## 2022-11-11 DIAGNOSIS — M79675 Pain in left toe(s): Secondary | ICD-10-CM

## 2022-11-11 DIAGNOSIS — I739 Peripheral vascular disease, unspecified: Secondary | ICD-10-CM

## 2022-11-11 DIAGNOSIS — B351 Tinea unguium: Secondary | ICD-10-CM

## 2022-11-11 DIAGNOSIS — N184 Chronic kidney disease, stage 4 (severe): Secondary | ICD-10-CM

## 2022-11-15 NOTE — Progress Notes (Signed)
  Subjective:  Patient ID: Daniel Warner, male    DOB: Mar 11, 1940,  MRN: [id number]  82 y.o. male presents with at risk foot care. Pt has h/o NIDDM with chronic kidney disease and painful elongated mycotic toenails 1-5 bilaterally which are tender when wearing enclosed shoe gear. Pain is relieved with periodic professional debridement. Chief Complaint  Patient presents with   Diabetes    [facility]     PCP: Emilio Aspen, MD.  New problem(s): None.   Review of Systems: Negative except as noted in the HPI.   Allergies  Allergen Reactions   Losartan Potassium     Other reaction(s): itching and hand swelling  Other Reaction(s): itching and hand swelling   Lisinopril     Other reaction(s): Cough    Objective:  There were no vitals filed for this visit. Constitutional Patient is a pleasant 82 y.o. African American male in NAD. AAO x 3.  Vascular Capillary fill time to digits <3 seconds.  DP/PT pulse(s) are faintly palpable b/l lower extremities. Pedal hair absent b/l. Lower extremity skin temperature gradient warm to cool b/l. No pain with calf compression b/l. No cyanosis or clubbing noted. No ischemia nor gangrene noted b/l. Trace edema noted BLE.  Neurologic Protective sensation intact 5/5 intact bilaterally with 10g monofilament b/l. Vibratory sensation intact b/l. No clonus b/l.   Dermatologic Pedal skin is thin, shiny and atrophic b/l.  No open wounds b/l lower extremities. No interdigital macerations b/l lower extremities. Toenails 1-5 b/l elongated, discolored, dystrophic, thickened, crumbly with subungual debris and tenderness to dorsal palpation. No hyperkeratotic nor porokeratotic lesions present on today's visit.  Orthopedic: Normal muscle strength 5/5 to all lower extremity muscle groups bilaterally. Pes planus deformity noted bilateral LE.   Last HgA1c:     Latest Ref Rng & Units 02/12/2022   11:56 AM  Hemoglobin A1C  Hemoglobin-A1c 4.8 - 5.6 % 5.6       Assessment:   1. Pain due to onychomycosis of toenails of both feet   2. PAD (peripheral artery disease) (HCC)   3. Type 2 diabetes mellitus with stage 4 chronic kidney disease, without long-term current use of insulin (HCC)    Plan:  -Patient was evaluated today. All questions/concerns addressed on today's visit. -Continue foot and shoe inspections daily. Monitor blood glucose per PCP/Endocrinologist's recommendations. -Patient to continue soft, supportive shoe gear daily. -Mycotic toenails 1-5 bilaterally were debrided in length and girth with sterile nail nippers and dremel. Pinpoint bleeding of R 3rd toe addressed with Lumicain Hemostatic Solution, cleansed with alcohol. Triple antibiotic ointment applied. Patient/caregiver instructed to apply Neosporin Cream once daily for 7 days. -Patient/POA to call should there be question/concern in the interim.  Return in about 3 months (around 02/11/2023).  Freddie Breech, DPM

## 2022-12-05 LAB — LAB REPORT - SCANNED
Creatinine, POC: 84.3 mg/dL
EGFR: 30

## 2023-01-11 ENCOUNTER — Encounter: Payer: Self-pay | Admitting: *Deleted

## 2023-02-12 ENCOUNTER — Other Ambulatory Visit: Payer: Self-pay | Admitting: Interventional Cardiology

## 2023-03-03 ENCOUNTER — Ambulatory Visit: Payer: PPO | Admitting: Podiatry

## 2023-04-14 ENCOUNTER — Ambulatory Visit (INDEPENDENT_AMBULATORY_CARE_PROVIDER_SITE_OTHER): Payer: PPO | Admitting: Podiatry

## 2023-04-14 ENCOUNTER — Encounter: Payer: Self-pay | Admitting: Podiatry

## 2023-04-14 VITALS — Ht 65.0 in | Wt 181.0 lb

## 2023-04-14 DIAGNOSIS — M79675 Pain in left toe(s): Secondary | ICD-10-CM

## 2023-04-14 DIAGNOSIS — M2141 Flat foot [pes planus] (acquired), right foot: Secondary | ICD-10-CM | POA: Diagnosis not present

## 2023-04-14 DIAGNOSIS — E1122 Type 2 diabetes mellitus with diabetic chronic kidney disease: Secondary | ICD-10-CM

## 2023-04-14 DIAGNOSIS — M2142 Flat foot [pes planus] (acquired), left foot: Secondary | ICD-10-CM

## 2023-04-14 DIAGNOSIS — M79674 Pain in right toe(s): Secondary | ICD-10-CM

## 2023-04-14 DIAGNOSIS — N184 Chronic kidney disease, stage 4 (severe): Secondary | ICD-10-CM

## 2023-04-14 DIAGNOSIS — B351 Tinea unguium: Secondary | ICD-10-CM

## 2023-04-14 DIAGNOSIS — I739 Peripheral vascular disease, unspecified: Secondary | ICD-10-CM

## 2023-04-14 NOTE — Progress Notes (Signed)
 ANNUAL DIABETIC FOOT EXAM  Subjective: Daniel Warner presents today for annual diabetic foot exam.  Chief Complaint  Patient presents with   dfc    " I am here for diabetic nail trim, Last A1C was 7.0, PCP is Dr Orson Aloe and [facility]"    Patient confirms h/o diabetes.  Patient denies any h/o foot wounds.  Patient has been diagnosed with PAD.  Emilio Aspen, MD is patient's PCP.  Past Medical History:  Diagnosis Date   Carotid artery disease (HCC)    CKD (chronic kidney disease), stage IV (HCC)    Diabetes mellitus type 2 with peripheral artery disease (HCC)    Discolored skin    Right leg, thinks it's from a bag that has rubbed against his leg for so long. Doppler 12/2010   GERD (gastroesophageal reflux disease)    Hypercholesterolemia    Hyperlipidemia    Numbness of toes    In the morning   Obesity    Peripheral arterial disease (HCC)    Patient Active Problem List   Diagnosis Date Noted   Pleural effusion 02/18/2022   Coronary artery disease due to lipid rich plaque 02/18/2022   AKI (acute kidney injury) (HCC) 02/18/2022   HFrEF (heart failure with reduced ejection fraction) (HCC) 02/11/2022   Type 2 diabetes mellitus with hyperlipidemia (HCC) 12/10/2021   Diabetic retinopathy (HCC) 03/07/2021   Hyperparathyroidism due to renal insufficiency (HCC) 03/07/2021   Peripheral venous insufficiency 03/07/2021   Anemia 05/21/2020   Diabetic nephropathy (HCC) 05/21/2020   Hardening of the aorta (main artery of the heart) (HCC) 05/21/2020   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HCC) 05/21/2020   Pure hypercholesterolemia 05/21/2020   Stricture and stenosis of esophagus 05/21/2020   Diabetes (HCC) 04/08/2018   OSA (obstructive sleep apnea) 04/08/2018   Chronic kidney disease (CKD), stage IV (severe) (HCC) 04/08/2018   GERD (gastroesophageal reflux disease) 04/08/2018   PAD (peripheral artery disease) (HCC) 10/16/2014   Essential  hypertension 04/11/2013   Mixed hyperlipidemia 04/11/2013   Occlusion and stenosis of carotid artery without mention of cerebral infarction 04/11/2013   Class 1 obesity 04/11/2013   Nonspecific abnormal electrocardiogram (ECG) (EKG) 04/11/2013   Past Surgical History:  Procedure Laterality Date   PERIPHERAL VASCULAR CATHETERIZATION N/A 10/16/2014   Procedure: Abdominal Aortogram w/Lower Extremity;  Surgeon: Nada Libman, MD;  Location: [facility];  Service: Cardiovascular;  Laterality: N/A;   RIGHT/LEFT HEART CATH AND CORONARY ANGIOGRAPHY N/A 02/16/2022   Procedure: RIGHT/LEFT HEART CATH AND CORONARY ANGIOGRAPHY;  Surgeon: Runell Gess, MD;  Location: [facility];  Service: Cardiovascular;  Laterality: N/A;   TONSILLECTOMY     WISDOM TOOTH EXTRACTION     Current Outpatient Medications on File Prior to Visit  Medication Sig Dispense Refill   aspirin EC 81 MG tablet Take 1 tablet (81 mg total) by mouth daily. Swallow whole. 30 tablet 12   atorvastatin (LIPITOR) 80 MG tablet Take 80 mg by mouth every morning.     calcitRIOL (ROCALTROL) 0.25 MCG capsule Take 0.25 mcg by mouth daily.  0   clopidogrel (PLAVIX) 75 MG tablet Take 1 tablet (75 mg total) by mouth daily with breakfast. 30 tablet 1   Continuous Blood Gluc Receiver (FREESTYLE LIBRE 14 DAY READER) DEVI      Continuous Blood Gluc Sensor (FREESTYLE LIBRE 14 DAY SENSOR) MISC      ergocalciferol (VITAMIN D2) 1.25 MG (50000 UT) capsule Take 50,000 Units by  mouth once a week.     nitroGLYCERIN (NITROSTAT) 0.4 MG SL tablet DISSOLVE 1 TABLET UNDER THE TONGUE AS NEEDED FOR CHEST PAIN EVERY 5 MINUTES UP TO 3 TIMES. IF NO RELIEF CALL 911. 25 tablet 2   TRULICITY 1.5 MG/0.5ML SOPN Inject 1.5 mg into the skin once a week. Saturday or Sunday     carvedilol (COREG) 3.125 MG tablet Take 1 tablet (3.125 mg total) by mouth 2 (two) times daily with a meal. 60 tablet 0   hydrALAZINE (APRESOLINE) 10 MG tablet Take 1 tablet (10 mg total)  by mouth in the morning and at bedtime. 60 tablet 0   isosorbide mononitrate (IMDUR) 30 MG 24 hr tablet Take 0.5 tablets (15 mg total) by mouth daily. 15 tablet 0   torsemide 40 MG TABS Take 40 mg by mouth daily. 30 tablet 0   No current facility-administered medications on file prior to visit.    Allergies  Allergen Reactions   Losartan Potassium     Other reaction(s): itching and hand swelling  Other Reaction(s): itching and hand swelling   Lisinopril     Other reaction(s): Cough   Social History   Occupational History   Occupation: retired  Tobacco Use   Smoking status: Never   Smokeless tobacco: Never  Vaping Use   Vaping status: Never Used  Substance and Sexual Activity   Alcohol use: No   Drug use: No   Sexual activity: Not on file   Family History  Problem Relation Age of Onset   Stroke Father    Irritable bowel syndrome Mother    Heart disease Paternal Aunt        CABG   Heart disease Cousin        CABG   Immunization History  Administered Date(s) Administered   H1N1 03/09/2008   Influenza Split 12/03/2009, 12/23/2010, 10/30/2011, 12/30/2012   Influenza, High Dose Seasonal PF 10/24/2014, 10/17/2015, 09/30/2016, 09/22/2017   Influenza, Seasonal, Injecte, Preservative Fre 12/21/2008   Influenza-Unspecified 11/13/2006, 10/18/2007   PFIZER(Purple Top)SARS-COV-2 Vaccination 02/25/2019, 03/22/2019   Pneumococcal Conjugate-13 06/19/2013   Pneumococcal Polysaccharide-23 12/21/2008, 05/24/2017   Td 04/03/2009, 09/16/2021   Td (Adult) 12/19/2008   Tdap 04/03/2009   Zoster, Live 12/19/2014, 09/16/2021     Review of Systems: Negative except as noted in the HPI.   Objective: There were no vitals filed for this visit.  Daniel Warner is a pleasant 83 y.o. male in NAD. AAO X 3.  Diabetic foot exam was performed with the following findings:   Vascular Examination: CFT <3 seconds b/l. DP pulses faintly palpable b/l. PT pulses nonpalpable b/l. Digital hair  absent. Skin temperature gradient warm to warm b/l. No pain with calf compression. No ischemia or gangrene. No cyanosis or clubbing noted b/l. +1 pitting edema noted BLE.   Neurological Examination: Sensation grossly intact b/l with 10 gram monofilament.   Dermatological Examination: Pedal skin warm and supple b/l. No open wounds b/l. No interdigital macerations. Toenails 1-5 b/l thick, discolored, elongated with subungual debris and pain on dorsal palpation.  No corns, calluses nor porokeratotic lesions noted.  Musculoskeletal Examination: Pes planus deformity noted bilateral LE.  Radiographs: None     Lab Results  Component Value Date   HGBA1C 5.6 02/12/2022   ADA Risk Categorization: High Risk  Patient has one or more of the following: Loss of protective sensation Absent pedal pulses Severe Foot deformity History of foot ulcer  Assessment: 1. Pain due to onychomycosis of toenails of both  feet   2. Pes planus of both feet   3. PAD (peripheral artery disease) (HCC)   4. Type 2 diabetes mellitus with stage 4 chronic kidney disease, without long-term current use of insulin (HCC)     Plan: Diabetic foot examination performed today. All patient's and/or POA's questions/concerns addressed on today's visit. Toenails 1-5 debrided in length and girth without incident. Continue foot and shoe inspections daily. Monitor blood glucose per PCP/Endocrinologist's recommendations. Continue soft, supportive shoe gear daily. Report any pedal injuries to medical professional. Call office if there are any questions/concerns. -Patient/POA to call should there be question/concern in the interim. Return in about 3 months (around 07/14/2023).  Freddie Breech, DPM      [place] LOCATION: [address] [place] [address]                   Office [phone]   [facility] LOCATION: [address] [place], [place] [address] Office  [phone]

## 2023-04-18 ENCOUNTER — Encounter: Payer: Self-pay | Admitting: Podiatry

## 2023-05-05 IMAGING — DX DG CHEST 2V
2 series · 2 of 2 positions shown · non-contrast
Comparison: 02/29/2020

CLINICAL DATA: Wheezing. Cough. Respiratory crackles and shortness
of breath.

EXAM:
CHEST - 2 VIEW

[dg chest 2 view (1 of 2)]
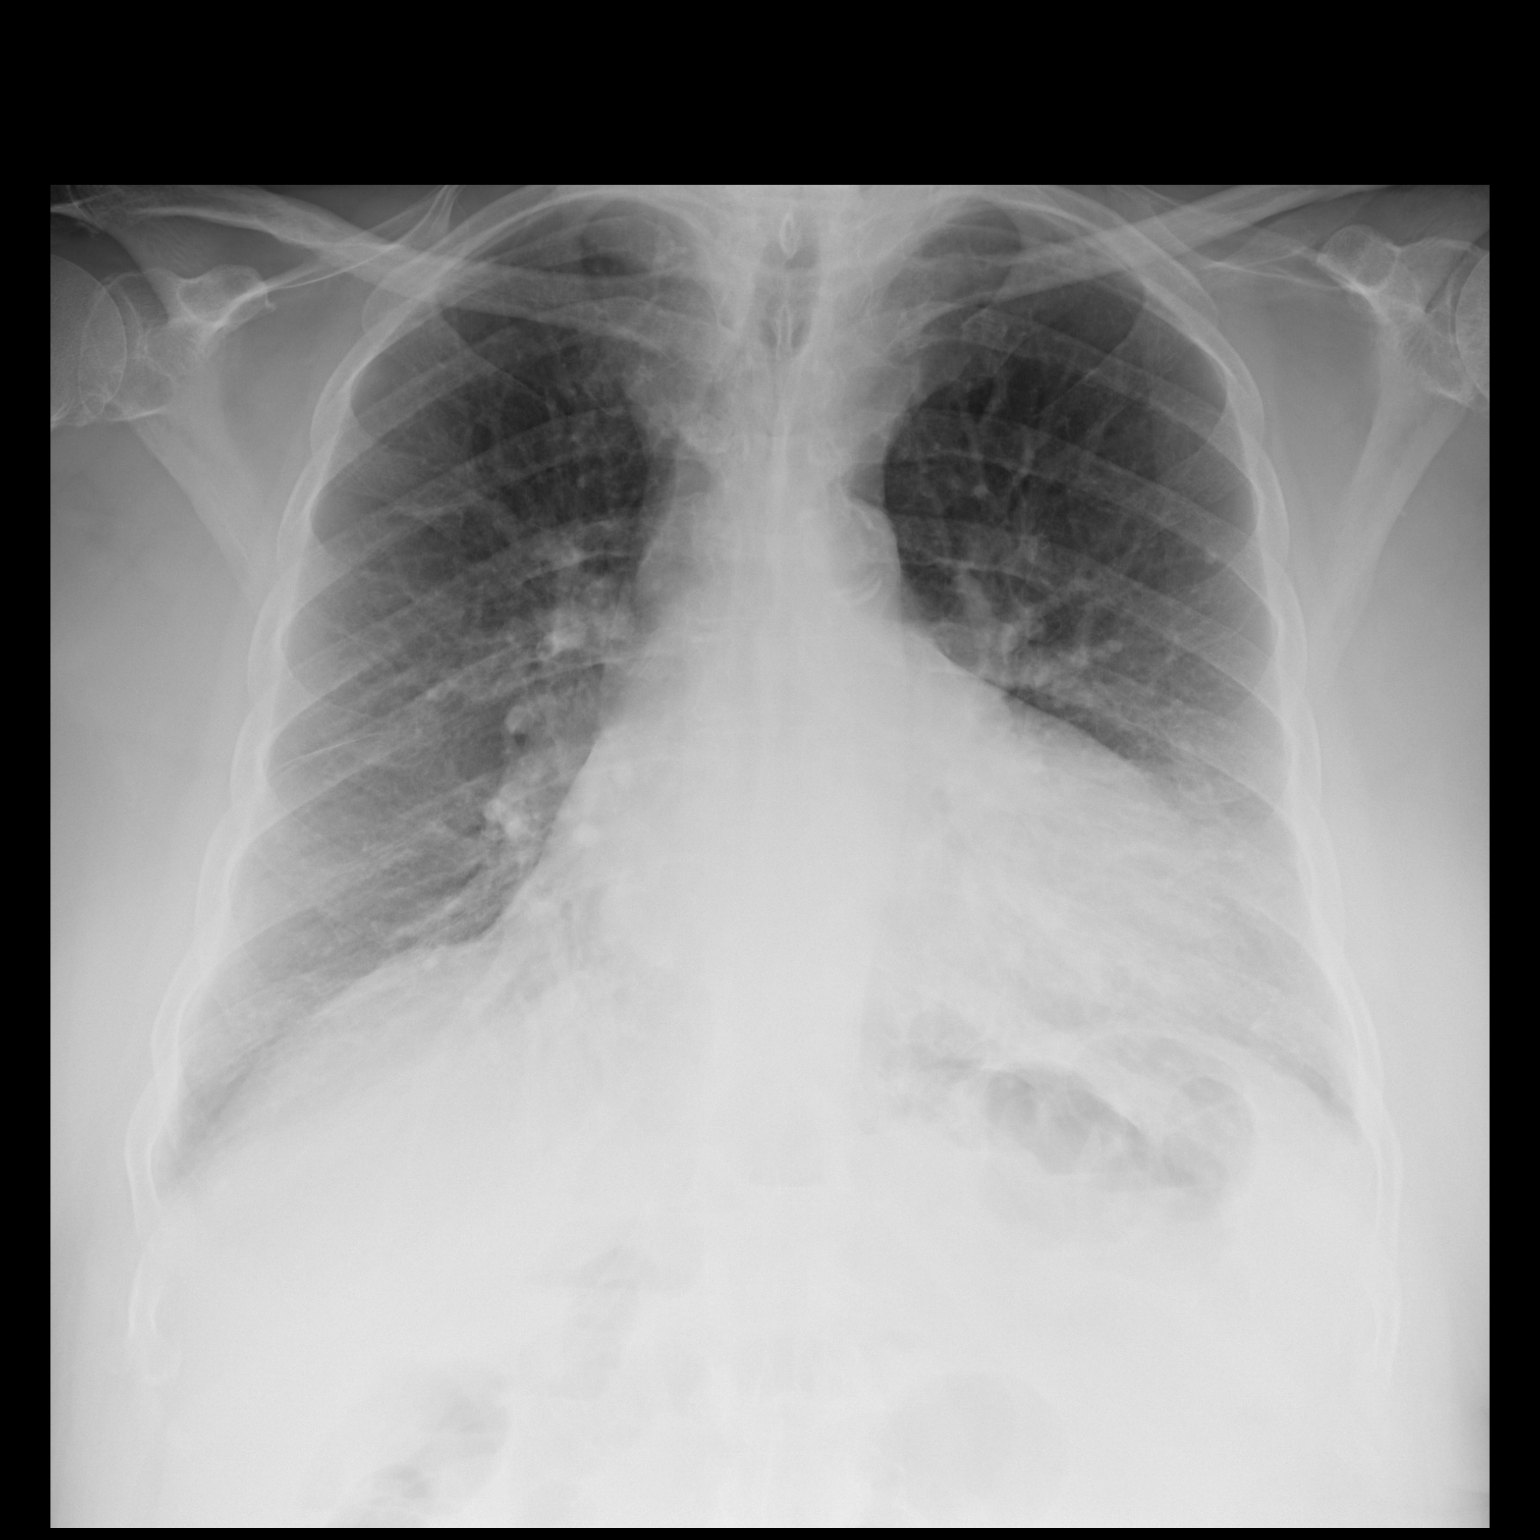

[dg chest 2 view (2 of 2)]
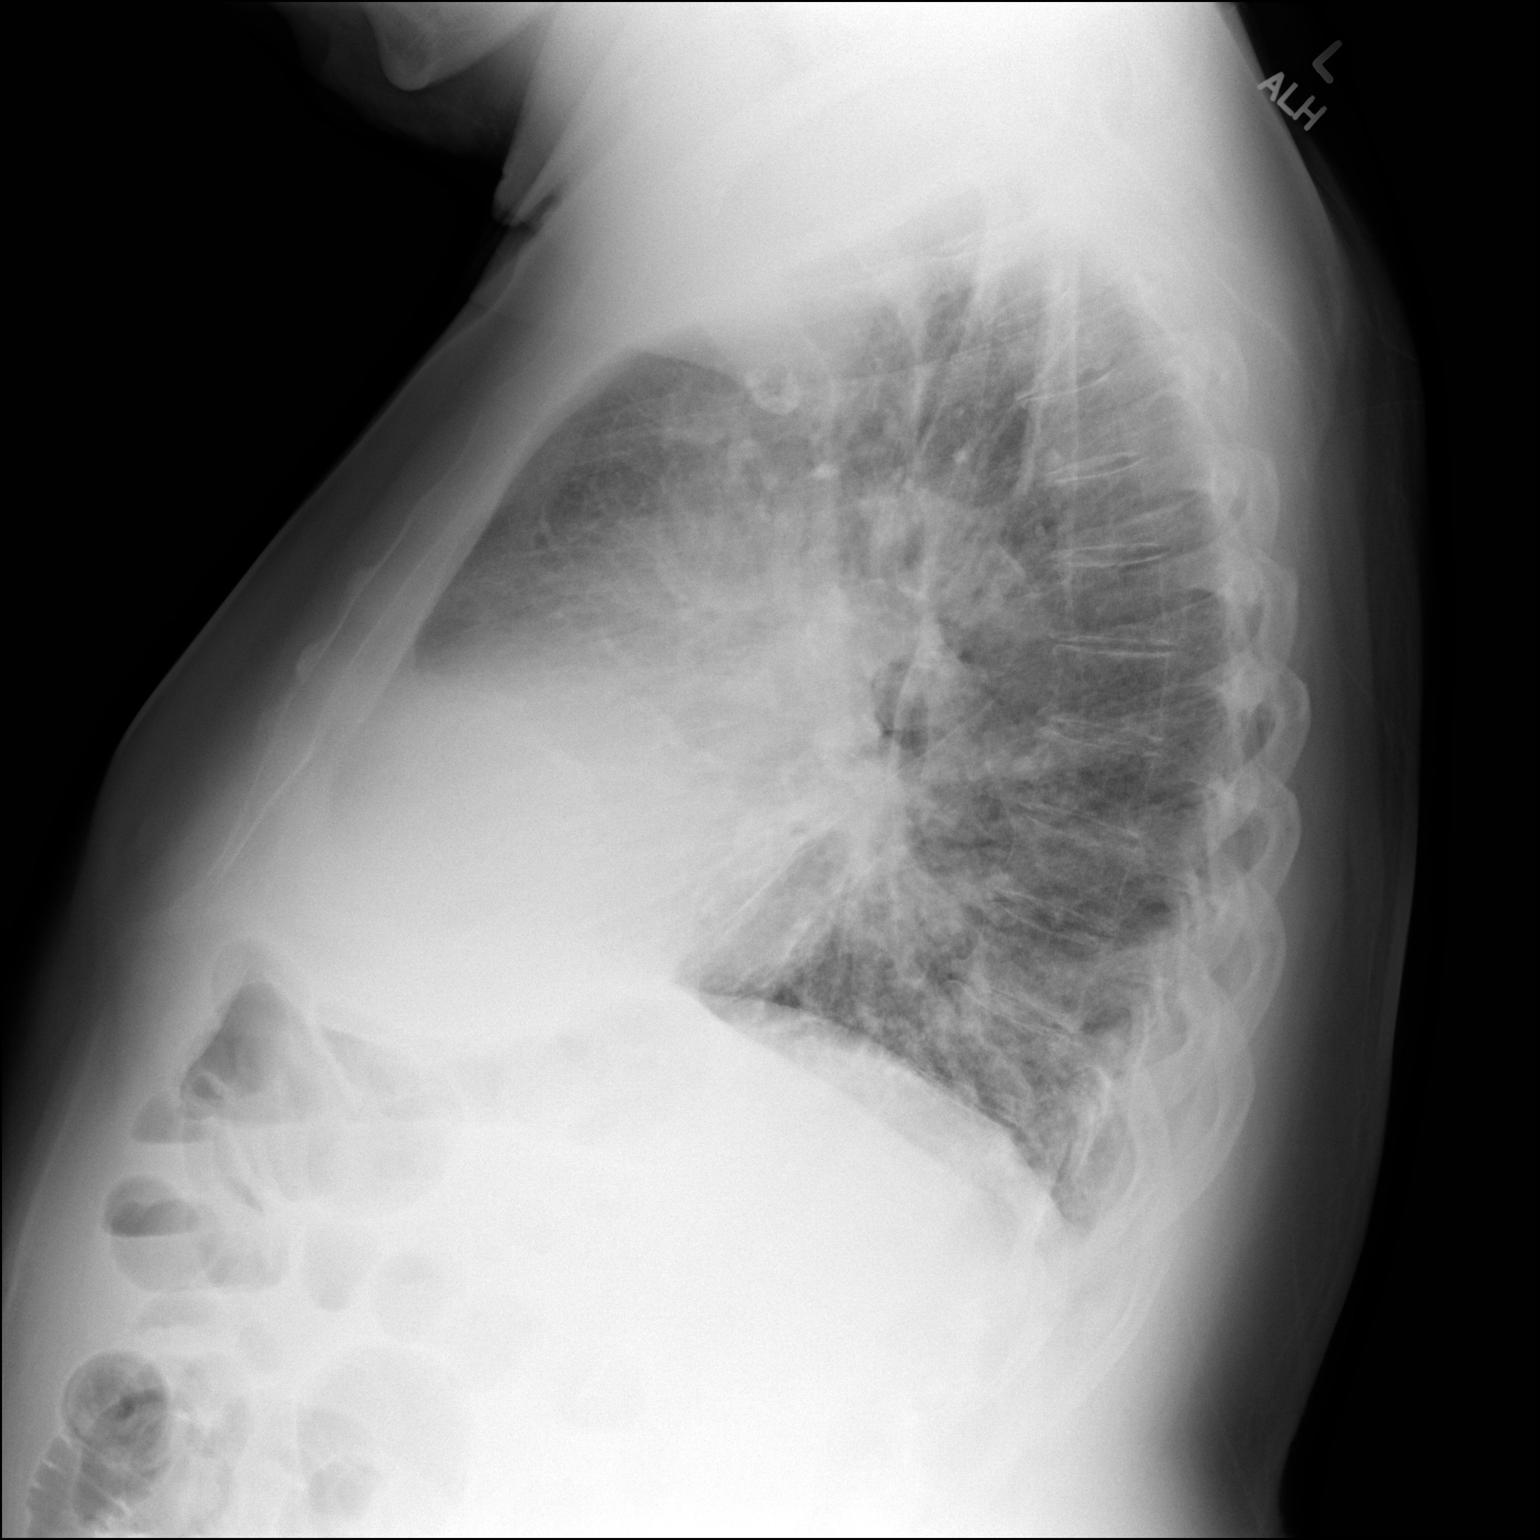

[2 of 2 positions shown; findings below may reference images not displayed]

FINDINGS: Cardiac silhouette borderline enlarged. No mediastinal or hilar
masses or evidence of adenopathy.

Prominent bronchovascular markings at the lung bases similar to the
prior exam. Lungs otherwise clear.

No pleural effusion or pneumothorax.

Skeletal structures are intact.
IMPRESSION: No active cardiopulmonary disease.

## 2023-05-14 ENCOUNTER — Other Ambulatory Visit: Payer: Self-pay | Admitting: Physician Assistant

## 2023-07-28 ENCOUNTER — Ambulatory Visit: Admitting: Podiatry

## 2023-07-28 ENCOUNTER — Encounter: Payer: Self-pay | Admitting: Podiatry

## 2023-07-28 DIAGNOSIS — E1122 Type 2 diabetes mellitus with diabetic chronic kidney disease: Secondary | ICD-10-CM

## 2023-07-28 DIAGNOSIS — I739 Peripheral vascular disease, unspecified: Secondary | ICD-10-CM | POA: Diagnosis not present

## 2023-07-28 DIAGNOSIS — M79675 Pain in left toe(s): Secondary | ICD-10-CM

## 2023-07-28 DIAGNOSIS — N184 Chronic kidney disease, stage 4 (severe): Secondary | ICD-10-CM

## 2023-07-28 DIAGNOSIS — M79674 Pain in right toe(s): Secondary | ICD-10-CM | POA: Diagnosis not present

## 2023-07-28 DIAGNOSIS — B351 Tinea unguium: Secondary | ICD-10-CM | POA: Diagnosis not present

## 2023-07-28 NOTE — Progress Notes (Signed)
  Subjective:  Patient ID: Daniel Warner, male    DOB: Oct 29, 1940,  MRN: [id number]  Jadore Veals Daniel Warner presents to clinic today for at risk foot care. Pt has h/o NIDDM with chronic kidney disease and painful mycotic toenails of both feet that are difficult to trim. Pain interferes with daily activities and wearing enclosed shoe gear comfortably.  Chief Complaint  Patient presents with   [facility]    Rm15 [facility] Diabetic/A1c 6 DR. [place] March 2025   New problem(s): None.   PCP is Charlott Dorn LABOR, MD.  Allergies  Allergen Reactions   Losartan Potassium     Other reaction(s): itching and hand swelling  Other Reaction(s): itching and hand swelling   Lisinopril     Other reaction(s): Cough    Review of Systems: Negative except as noted in the HPI.  Objective: No changes noted in today's physical examination. There were no vitals filed for this visit. CHISTIAN Warner is a pleasant 83 y.o. male in NAD. AAO x 3.  Vascular Examination: CFT <3 seconds b/l. DP pulses 1/4 b/l. PT pulses 0/4 b/l. Digital hair absent. Skin temperature gradient warm to warm b/l. No pain with calf compression. No ischemia or gangrene. No cyanosis or clubbing noted b/l. Trace edema noted BLE.   Neurological Examination: Sensation grossly intact b/l with 10 gram monofilament.   Dermatological Examination: Pedal skin warm and supple b/l. No open wounds b/l. No interdigital macerations. Toenails 1-5 b/l thick, discolored, elongated with subungual debris and pain on dorsal palpation.  No hyperkeratotic nor porokeratotic lesions present on today's visit.  Musculoskeletal Examination: Muscle strength 5/5 to all lower extremity muscle groups bilaterally. Pes planus deformity noted bilateral LE. Patient ambulates independent of any assistive aids.  Radiographs: None  Assessment/Plan: 1. Pain due to onychomycosis of toenails of both feet   2. PAD (peripheral artery disease) (HCC)   3. Type 2 diabetes  mellitus with stage 4 chronic kidney disease, without long-term current use of insulin  [facility])    Consent given for treatment. Patient examined. All patient's and/or POA's questions/concerns addressed on today's visit. Mycotic toenails 1-5 debrided in length and girth without incident. Continue foot and shoe inspections daily. Monitor blood glucose per PCP/Endocrinologist's recommendations.Continue soft, supportive shoe gear daily. Report any pedal injuries to medical professional. Call office if there are any quesitons/concerns. -Patient/POA to call should there be question/concern in the interim.   Return in about 3 months (around 10/28/2023).  Delon LITTIE Merlin, DPM      [place] LOCATION: [address] [place] [address]                   Office [phone]   [facility] LOCATION: [address] [place], [place] [address] Office [phone]

## 2023-08-11 NOTE — Progress Notes (Signed)
 Cardiology Office Note    Date:  08/14/2023  ID:  Daniel Warner, DOB June 21, 1940, MRN [id number] PCP:  Charlott Dorn LABOR, MD  Cardiologist:  Lurena MARLA Red, MD  Electrophysiologist:  None   Chief Complaint: Follow up for CAD   History of Present Illness: .    IRL Daniel Warner is a 83 y.o. male with visit-pertinent history of HFrEF, CAD with LHC in 02/2022 with L LCx 99%, L LAD 90% with medication management recommended, CKD stage IV, carotid artery disease, PAD s/p R SFA stent in 2016 with Dr. Oris, diabetes mellitus, hypertension, hyperlipidemia.   Patient's EF was previously 25 to 30%, follow-up echocardiogram in June 2024 showed EF improved at 40 to 45%.  Patient was last seen in clinic on 09/15/2022 by Glendia Ferrier, PA.  Patient denied any new health issues at that time.  He endorsed occasional chest discomfort described as gas and associated with meals, not associate physical activity and was relieved by belching.   Today he presents for follow up. He reports that he is doing well overall.  Patient reports that last month he had increased shortness of breath and congestion, was seen by his PCP and had been started on Mucinex , also recommended to take Allegra.  Patient reports that his cough and congestion completely resolved with Mucinex , has noted significant improvement in his breathing.  He reports that he only become short of breath when doing prolonged exertion, with mild to moderate exertion he is able to tolerate well.  He denies any chest pain with exertion, notes that he will occasionally have a right sided chest discomfort that improves after drinking vinegar and belching.  He denies any significant lower extremity edema, orthopnea or PND.  He reports that his weights have been stable.  He does not monitor his blood pressure at home.  He reports that he has been following with his nephrologist who has been adjusting his diuretics and blood pressure medications.  Patient reports  that he has not yet started taking Allegra, continues to have problems with postnasal drip and watery eyes.   Labwork independently reviewed: 07/30/2023: Hemoglobin 10.2, hematocrit 30, creatinine 1.98, sodium 141, potassium 4, AST 17, ALT 12 ROS: .   Today he denies chest pain, lower extremity edema, fatigue, palpitations, melena, hematuria, hemoptysis, diaphoresis, weakness, presyncope, syncope, orthopnea, and PND.  All other systems are reviewed and otherwise negative. Studies Reviewed: SABRA   EKG:  EKG is ordered today, personally reviewed, demonstrating  EKG Interpretation Date/Time:  Friday August 13 2023 14:05:02 EDT Ventricular Rate:  63 PR Interval:  234 QRS Duration:  100 QT Interval:  432 QTC Calculation: 442 R Axis:   17  Text Interpretation: Sinus rhythm with 1st degree A-V block Possible Inferior infarct (cited on or before 11-Feb-2022) incomplete bundle branch block Confirmed by Davyd Podgorski [phone]) on 08/13/2023 6:31:11 PM   CV Studies: Cardiac studies reviewed are outlined and summarized above. Otherwise please see EMR for full report. Cardiac Studies & Procedures   ______________________________________________________________________________________________ CARDIAC CATHETERIZATION  CARDIAC CATHETERIZATION 02/16/2022  Conclusion Images from the original result were not included.    Ost Cx to Prox Cx lesion is 99% stenosed.   Ost LAD to Prox LAD lesion is 90% stenosed.  LEBARON BAUTCH is a 83 y.o. male   [id number] LOCATION:  FACILITY: [facility] PHYSICIAN: Dorn Lesches, M.D. 10/27/1940   DATE OF PROCEDURE:  02/16/2022  DATE OF DISCHARGE:     CARDIAC CATHETERIZATION    History obtained  from chart review.[name] is a 83 y.o. male with a hx of DM, HTN, HLD, PAD s/p several prior LE interventions followed by VVS on Plavix , CKD stage IIIb- IV (baseline Cr 1.66-1.98 in 2023 per [facility]) followed at [facility], carotid artery disease (1-39%  [facility] in 2018) who is being seen 02/13/2022 for the evaluation of CHF at the request of Dr. Vernon.  He has been diuresed 11 L.  His troponins were not mildly.  His 2D echo revealed severe LV dysfunction without valvular abnormalities.  He was referred for right left heart cath to define his anatomy and physiology.   HEMODYNAMICS: \  1: Right atrial pressure-9/8, mean of 6 2: Right ventricular pressure-63/2 3: Pulm artery pressure-62/21, mean 37 4: Pulmonary wedge pressure-A-wave 14, V wave 12, mean 12 5: LVEDP-10 6: Cardiac output-7.8 L/min with an index of 3.9 L/min/m  Impression Mr. Hintz has severe ischemic cardiomyopathy with a subtotally occluded dominant circumflex and high-grade ostial/proximal LAD.  His initial presentation was of heart failure.  He denies chest pain.  Given his age and LV dysfunction I would recommend continued medical therapy at this time.  The radial sheath and antecubital sheath were removed.  A TR band was placed on the right wrist to achieve patent hemostasis.  The patient left lab in stable condition.  Dr. Shlomo, the patient's attending cardiologist, was notified of these results.  Dorn Lesches. MD, [facility] 02/16/2022 8:56 AM  Findings Coronary Findings Diagnostic  Dominance: Left  Left Anterior Descending Ost LAD to Prox LAD lesion is 90% stenosed.  Left Circumflex Ost Cx to Prox Cx lesion is 99% stenosed. The lesion is calcified.  Intervention  No interventions have been documented.     ECHOCARDIOGRAM  ECHOCARDIOGRAM COMPLETE 06/23/2022  Narrative ECHOCARDIOGRAM REPORT    Patient Name:   Daniel Warner Date of Exam: 06/23/2022 Medical Rec #:  [id number]         Height:       64.0 in Accession #:    [id number]        Weight:       181.4 lb Date of Birth:  [birth year]         BSA:          1.877 m Patient Age:    82 years          BP:           122/54 mmHg Patient Gender: M                 HR:           66 bpm. Exam Location:  [address]  Procedure: 2D Echo, Cardiac Doppler, Color Doppler and Intracardiac Opacification Agent  Indications:    I50.22 Chronic systolic heart failure  History:        Patient has prior history of Echocardiogram examinations, most recent 02/12/2022. Risk Factors:Diabetes and Dyslipidemia.  Sonographer:    Carl Rodgers-Jones RDCS Referring Phys: [id number] JAYADEEP S VARANASI  IMPRESSIONS   1. Left ventricular ejection fraction, by estimation, is 40 to 45%. The left ventricle has mildly decreased function. The left ventricle has no regional wall motion abnormalities. Left ventricular diastolic parameters are consistent with Grade I diastolic dysfunction (impaired relaxation). 2. Right ventricular systolic function is normal. The right ventricular size is normal. 3. Left atrial size was mildly dilated. 4. The mitral valve is normal in structure. No evidence of mitral valve regurgitation. No evidence of mitral stenosis. 5.  The aortic valve is normal in structure. There is mild calcification of the aortic valve. Aortic valve regurgitation is not visualized. No aortic stenosis is present. 6. The inferior vena cava is normal in size with greater than 50% respiratory variability, suggesting right atrial pressure of 3 mmHg.  FINDINGS Left Ventricle: Left ventricular ejection fraction, by estimation, is 40 to 45%. The left ventricle has mildly decreased function. The left ventricle has no regional wall motion abnormalities. Definity  contrast agent was given IV to delineate the left ventricular endocardial borders. The left ventricular internal cavity size was normal in size. There is no left ventricular hypertrophy. Left ventricular diastolic parameters are consistent with Grade I diastolic dysfunction (impaired relaxation).   LV Wall Scoring: The entire lateral wall and apical inferior segment are hypokinetic.  Right Ventricle: The right ventricular size is normal. No increase in right  ventricular wall thickness. Right ventricular systolic function is normal.  Left Atrium: Left atrial size was mildly dilated.  Right Atrium: Right atrial size was normal in size.  Pericardium: There is no evidence of pericardial effusion.  Mitral Valve: The mitral valve is normal in structure. No evidence of mitral valve regurgitation. No evidence of mitral valve stenosis.  Tricuspid Valve: The tricuspid valve is normal in structure. Tricuspid valve regurgitation is not demonstrated. No evidence of tricuspid stenosis.  Aortic Valve: The aortic valve is normal in structure. There is mild calcification of the aortic valve. Aortic valve regurgitation is not visualized. No aortic stenosis is present.  Pulmonic Valve: The pulmonic valve was normal in structure. Pulmonic valve regurgitation is not visualized. No evidence of pulmonic stenosis.  Aorta: The aortic root is normal in size and structure.  Venous: The inferior vena cava is normal in size with greater than 50% respiratory variability, suggesting right atrial pressure of 3 mmHg.  IAS/Shunts: No atrial level shunt detected by color flow Doppler.   LEFT VENTRICLE PLAX 2D LVIDd:         5.00 cm   Diastology LVIDs:         4.00 cm   LV e' medial:    4.79 cm/s LV PW:         1.00 cm   LV E/e' medial:  16.5 LV IVS:        1.00 cm   LV e' lateral:   5.11 cm/s LVOT diam:     1.90 cm   LV E/e' lateral: 15.5 LVOT Area:     2.84 cm   RIGHT VENTRICLE             IVC RV Basal diam:  4.60 cm     IVC diam: 1.10 cm RV S prime:     10.48 cm/s TAPSE (M-mode): 2.6 cm  LEFT ATRIUM             Index        RIGHT ATRIUM           Index LA diam:        5.60 cm 2.98 cm/m   RA Area:     14.90 cm LA Vol (A2C):   58.6 ml 31.22 ml/m  RA Volume:   38.30 ml  20.41 ml/m LA Vol (A4C):   72.9 ml 38.84 ml/m LA Biplane Vol: 67.7 ml 36.07 ml/m  AORTA Ao Root diam: 3.40 cm Ao Asc diam:  3.50 cm  MITRAL VALVE MV Area (PHT): 2.89 cm      SHUNTS MV Decel Time: 263 msec  Systemic Diam: 1.90 cm MV E velocity: 79.10 cm/s MV A velocity: 118.50 cm/s MV E/A ratio:  0.67  Aditya Sabharwal Electronically signed by Ria Commander Signature Date/Time: 06/23/2022/6:17:38 PM    Final          ______________________________________________________________________________________________       Current Reported Medications:.    Current Meds  Medication Sig   aspirin  EC 81 MG tablet Take 1 tablet (81 mg total) by mouth daily. Swallow whole.   atorvastatin  (LIPITOR ) 80 MG tablet Take 80 mg by mouth every morning.   calcitRIOL (ROCALTROL) 0.25 MCG capsule Take 0.25 mcg by mouth daily.   carvedilol  (COREG ) 3.125 MG tablet Take 1 tablet (3.125 mg total) by mouth 2 (two) times daily with a meal.   clopidogrel  (PLAVIX ) 75 MG tablet Take 1 tablet (75 mg total) by mouth daily with breakfast.   Continuous Blood Gluc Receiver (FREESTYLE LIBRE 14 DAY READER) DEVI    Continuous Blood Gluc Sensor (FREESTYLE LIBRE 14 DAY SENSOR) MISC    ergocalciferol (VITAMIN D2) 1.25 MG (50000 UT) capsule Take 50,000 Units by mouth once a week.   hydrALAZINE  (APRESOLINE ) 10 MG tablet Take 1 tablet (10 mg total) by mouth in the morning and at bedtime.   isosorbide  mononitrate (IMDUR ) 30 MG 24 hr tablet Take 0.5 tablets (15 mg total) by mouth daily.   nitroGLYCERIN  (NITROSTAT ) 0.4 MG SL tablet Place 1 tablet (0.4 mg total) under the tongue every 5 (five) minutes as needed for chest pain. IF NO RELIEF CALL 911.   torsemide  (DEMADEX ) 20 MG tablet Take 20 mg by mouth 2 (two) times daily.    Physical Exam:    VS:  BP 132/78   Pulse 63   Ht 5' 4 (1.626 m)   Wt 183 lb (83 kg)   SpO2 94%   BMI 31.41 kg/m    Wt Readings from Last 3 Encounters:  08/13/23 183 lb (83 kg)  04/14/23 181 lb (82.1 kg)  09/15/22 181 lb (82.1 kg)    GEN: Well nourished, well developed in no acute distress NECK: No JVD; No carotid bruits CARDIAC: RRR, no murmurs, rubs,  gallops RESPIRATORY:  Clear to auscultation without rales, wheezing or rhonchi  ABDOMEN: Soft, non-tender, non-distended EXTREMITIES:  No edema; No acute deformity     Asessement and Plan:.    CAD: Patient presented with non-STEMI in 02/2022.  Cardiac cath at that time demonstrated 90% stenosis in the ostial LCx and 90% stenosis in the ostial LAD.  Given patient's age and chronic kidney disease, medical therapy was recommended over surgical intervention. Today he reports that he has been doing well, he reports a right sided chest discomfort that is relieved with drinking of vinegar and with belching, denies any chest pain, tightness or pressure on exertion.  Patient notes that this is similar to his history of GERD and is unchanged.  Patient reports that he did have some increased shortness of breath with congestion last month, improved significantly with Mucinex , he continues to improve.  Discussed with patient that if this does not resolve fully to notify the office.  Reviewed ED precautions. Continue Plavix  75 mg daily and aspirin  81 mg daily. Continue Lipitor  80 mg daily, carvedilol  3.125 mg twice daily and nitroglycerin  as needed for chest pain.  Heart failure with reduced ejection fraction: EF previously 25 to 30%, improved to 40 to 45% on follow-up echo in 06/2022. Today he appears euvolemic and well compensated on exam.  Patient noted some increased shortness  of breath that improved with Mucinex  last month.  He will continue to monitor and notify the office if he does not recover back to his baseline.  He denies any increased lower extremity edema, orthopnea or PND.  Reports that weights have been stable.  Continue carvedilol  3.125 mg twice daily, hydralazine  10 mg twice daily, Imdur  15 mg daily and torsemide  40 mg daily.  Hyperlipidemia: Last LDL 72.  Continue atorvastatin  80 mg daily.  Patient reports that he is to have his physical in the next 1 to 2 months, will have fasting lipid profile  repeated at that time.  Hypertension: Blood pressure today 132/78.  Continue current antihypertensive regimen.  CKD: Last creatinine 1.98 on 07/30/2023. Patient is followed by nephrology.    Disposition: F/u with Dr. Wendel in six months or sooner if needed.   Signed, Ferlando Lia D Tashonda Pinkus, NP

## 2023-08-13 ENCOUNTER — Encounter: Payer: Self-pay | Admitting: Cardiology

## 2023-08-13 ENCOUNTER — Ambulatory Visit: Admitting: Physician Assistant

## 2023-08-13 ENCOUNTER — Ambulatory Visit: Attending: Cardiology | Admitting: Cardiology

## 2023-08-13 VITALS — BP 132/78 | HR 63 | Ht 64.0 in | Wt 183.0 lb

## 2023-08-13 DIAGNOSIS — E782 Mixed hyperlipidemia: Secondary | ICD-10-CM

## 2023-08-13 DIAGNOSIS — I5020 Unspecified systolic (congestive) heart failure: Secondary | ICD-10-CM | POA: Diagnosis not present

## 2023-08-13 DIAGNOSIS — I2583 Coronary atherosclerosis due to lipid rich plaque: Secondary | ICD-10-CM

## 2023-08-13 DIAGNOSIS — I2510 Atherosclerotic heart disease of native coronary artery without angina pectoris: Secondary | ICD-10-CM | POA: Diagnosis not present

## 2023-08-13 DIAGNOSIS — I10 Essential (primary) hypertension: Secondary | ICD-10-CM | POA: Diagnosis not present

## 2023-08-13 DIAGNOSIS — N184 Chronic kidney disease, stage 4 (severe): Secondary | ICD-10-CM

## 2023-08-13 NOTE — Patient Instructions (Signed)
 Medication Instructions:  Your physician recommends that you continue on your current medications as directed. Please refer to the Current Medication list given to you today.  *If you need a refill on your cardiac medications before your next appointment, please call your pharmacy*  Lab Work: None ordered If you have labs (blood work) drawn today and your tests are completely normal, you will receive your results only by: MyChart Message (if you have MyChart) OR A paper copy in the mail If you have any lab test that is abnormal or we need to change your treatment, we will call you to review the results.  Follow-Up: At [facility], you and your health needs are our priority.  As part of our continuing mission to provide you with exceptional heart care, our providers are all part of one team.  This team includes your primary Cardiologist (physician) and Advanced Practice Providers or APPs (Physician Assistants and Nurse Practitioners) who all work together to provide you with the care you need, when you need it.  Your next appointment:   5-6 month(s)  Provider:   Arun K Thukkani, MD

## 2023-08-14 ENCOUNTER — Encounter: Payer: Self-pay | Admitting: Cardiology

## 2023-11-02 ENCOUNTER — Other Ambulatory Visit: Payer: Self-pay | Admitting: Gastroenterology

## 2023-11-03 ENCOUNTER — Telehealth (HOSPITAL_BASED_OUTPATIENT_CLINIC_OR_DEPARTMENT_OTHER): Payer: Self-pay

## 2023-11-03 DIAGNOSIS — I5020 Unspecified systolic (congestive) heart failure: Secondary | ICD-10-CM

## 2023-11-03 DIAGNOSIS — Z01810 Encounter for preprocedural cardiovascular examination: Secondary | ICD-10-CM

## 2023-11-03 DIAGNOSIS — R9431 Abnormal electrocardiogram [ECG] [EKG]: Secondary | ICD-10-CM

## 2023-11-03 DIAGNOSIS — I2510 Atherosclerotic heart disease of native coronary artery without angina pectoris: Secondary | ICD-10-CM

## 2023-11-03 NOTE — Telephone Encounter (Signed)
 Tried contacting patient to schedule TELEVISIT no answer mailbox full unable to leave vm will try again at a later time

## 2023-11-03 NOTE — Telephone Encounter (Signed)
   Daniel Warner  You saw this patient on 08/13/2023. Per protocol we request that you comment on his cardiac risk to proceed with Endoscopy w/Dil for Dysphagia , since it has been less than 2 months since evaluated in the office. He is on Plavix , had cath by Dr. Court (Ost Cx to Prox Cx lesion is 99% stenosed. Ost LAD to Prox LAD lesion is 90% stenosed), medically managed due to age and CKD.   Please send your comment to P CV Pre-Op Pool.  Thank you, Lamarr Satterfield DNP, ANP, AACC.

## 2023-11-03 NOTE — Telephone Encounter (Signed)
   Pre-operative Risk Assessment    Patient Name: Daniel Warner  DOB: 31-Jan-1940 MRN: [id number]   Date of last office visit: 08/13/2023 with Katlyn West, NP Date of next office visit: N/A   Request for Surgical Clearance    Procedure:  Endoscopy w/Dil for Dysphagia  Date of Surgery:  Clearance 12/07/23                                 Surgeon:  Dr. Jerrell Sol Surgeon's Group or Practice Name:  [facility] Phone number:  [phone] Fax number:  [phone] or [phone] - direct fax   Type of Clearance Requested:   - Medical  - Pharmacy:  Hold Clopidogrel  (Plavix ) - Okay to hold 2 days prior? Aspirin  - not indicated   Type of Anesthesia:  Propofol    Additional requests/questions:  Please fax back to [facility] [phone] - Direct fax  Signed, Patrcia Iverson CROME   11/03/2023, 9:33 AM

## 2023-11-03 NOTE — Telephone Encounter (Signed)
   Name: Daniel Warner  DOB: [birth year]  MRN: [id number]  Primary Cardiologist: Arun K Thukkani, MD   Preoperative team, please contact this patient and set up a phone call appointment for further preoperative risk assessment. Please obtain consent and complete medication review. Thank you for your help.  I confirm that guidance regarding antiplatelet and oral anticoagulation therapy has been completed and, if necessary, noted below.  Per office protocol, if patient is without any new symptoms or concerns at the time of their virtual visit, he may hold Plavix  for 5 days prior to procedure. Please resume Plavix  as soon as possible postprocedure, at the discretion of the surgeon.    I also confirmed the patient resides in the state of [place] . As per [facility] Medical Board telemedicine laws, the patient must reside in the state in which the provider is licensed.   Lamarr Satterfield, NP 11/03/2023, 2:28 PM [facility]

## 2023-11-04 NOTE — Telephone Encounter (Signed)
  Patient Consent for Virtual Visit        Daniel Warner has provided verbal consent on 11/04/2023 for a virtual visit (video or telephone).   CONSENT FOR VIRTUAL VISIT FOR:  Daniel Warner  By participating in this virtual visit I agree to the following:  I hereby voluntarily request, consent and authorize [facility] and its employed or contracted physicians, physician assistants, nurse practitioners or other licensed health care professionals (the Practitioner), to provide me with telemedicine health care services (the "Services) as deemed necessary by the treating Practitioner. I acknowledge and consent to receive the Services by the Practitioner via telemedicine. I understand that the telemedicine visit will involve communicating with the Practitioner through live audiovisual communication technology and the disclosure of certain medical information by electronic transmission. I acknowledge that I have been given the opportunity to request an in-person assessment or other available alternative prior to the telemedicine visit and am voluntarily participating in the telemedicine visit.  I understand that I have the right to withhold or withdraw my consent to the use of telemedicine in the course of my care at any time, without affecting my right to future care or treatment, and that the Practitioner or I may terminate the telemedicine visit at any time. I understand that I have the right to inspect all information obtained and/or recorded in the course of the telemedicine visit and may receive copies of available information for a reasonable fee.  I understand that some of the potential risks of receiving the Services via telemedicine include:  Delay or interruption in medical evaluation due to technological equipment failure or disruption; Information transmitted may not be sufficient (e.g. poor resolution of images) to allow for appropriate medical decision making by the  Practitioner; and/or  In rare instances, security protocols could fail, causing a breach of personal health information.  Furthermore, I acknowledge that it is my responsibility to provide information about my medical history, conditions and care that is complete and accurate to the best of my ability. I acknowledge that Practitioner's advice, recommendations, and/or decision may be based on factors not within their control, such as incomplete or inaccurate data provided by me or distortions of diagnostic images or specimens that may result from electronic transmissions. I understand that the practice of medicine is not an exact science and that Practitioner makes no warranties or guarantees regarding treatment outcomes. I acknowledge that a copy of this consent can be made available to me via my patient portal [facility] MyChart), or I can request a printed copy by calling the office of [facility].    I understand that my insurance will be billed for this visit.   I have read or had this consent read to me. I understand the contents of this consent, which adequately explains the benefits and risks of the Services being provided via telemedicine.  I have been provided ample opportunity to ask questions regarding this consent and the Services and have had my questions answered to my satisfaction. I give my informed consent for the services to be provided through the use of telemedicine in my medical care

## 2023-11-04 NOTE — Telephone Encounter (Addendum)
 Patient scheduled for tele pre-op clearance on 11/22/23 with Barnie Hila, NP.

## 2023-11-10 ENCOUNTER — Ambulatory Visit: Admitting: Podiatry

## 2023-11-10 DIAGNOSIS — B351 Tinea unguium: Secondary | ICD-10-CM

## 2023-11-10 DIAGNOSIS — E1122 Type 2 diabetes mellitus with diabetic chronic kidney disease: Secondary | ICD-10-CM

## 2023-11-10 DIAGNOSIS — M79674 Pain in right toe(s): Secondary | ICD-10-CM

## 2023-11-10 DIAGNOSIS — I739 Peripheral vascular disease, unspecified: Secondary | ICD-10-CM | POA: Diagnosis not present

## 2023-11-10 DIAGNOSIS — M79675 Pain in left toe(s): Secondary | ICD-10-CM

## 2023-11-10 DIAGNOSIS — N184 Chronic kidney disease, stage 4 (severe): Secondary | ICD-10-CM

## 2023-11-11 NOTE — Telephone Encounter (Signed)
 Requesting office sent a duplicate. Pt has a tele preop appt 11/22/23. Once the pt has been cleared our office will fax clearance notes.

## 2023-11-14 ENCOUNTER — Encounter: Payer: Self-pay | Admitting: Podiatry

## 2023-11-14 NOTE — Progress Notes (Signed)
  Subjective:  Patient ID: Daniel Warner, male    DOB: [birth year],  MRN: [id number]  Daniel Warner presents to clinic today for at risk foot care. Pt has h/o NIDDM with chronic kidney disease and painful thick toenails that are difficult to trim. Pain interferes with ambulation. Aggravating factors include wearing enclosed shoe gear. Pain is relieved with periodic professional debridement.  Chief Complaint  Patient presents with   Diabetes    [facility] Diet control diabetes. Toenail trim. LOV with PCP 10/26/23.   New problem(s): None.   PCP is Daniel Warner LABOR, MD.  Allergies  Allergen Reactions   Losartan Potassium     Other reaction(s): itching and hand swelling  Other Reaction(s): itching and hand swelling   Lisinopril     Other reaction(s): Cough    Review of Systems: Negative except as noted in the HPI.  Objective: No changes noted in today's physical examination. There were no vitals filed for this visit. Daniel Warner is a pleasant 83 y.o. male in NAD. AAO x 3.  Vascular Examination: CFT <3 seconds b/l. DP pulses 1/4 b/l. PT pulses 0/4 b/l. Digital hair absent. Skin temperature gradient warm to warm b/l. No pain with calf compression. No ischemia or gangrene. No cyanosis or clubbing noted b/l. Trace edema noted BLE.   Neurological Examination: Sensation grossly intact b/l with 10 gram monofilament.   Dermatological Examination: Pedal skin warm and supple b/l. No open wounds b/l. No interdigital macerations. Toenails 1-5 b/l thick, discolored, elongated with subungual debris and pain on dorsal palpation.  No hyperkeratotic nor porokeratotic lesions present on today's visit.  Musculoskeletal Examination: Muscle strength 5/5 to all lower extremity muscle groups bilaterally. Pes planus deformity noted bilateral LE. Patient ambulates independent of any assistive aids.  Radiographs: None  Assessment/Plan: 1. Pain due to onychomycosis of toenails of both feet    2. PAD (peripheral artery disease)   3. Type 2 diabetes mellitus with stage 4 chronic kidney disease, without long-term current use of insulin  [facility])   Patient was evaluated and treated. All patient's and/or POA's questions/concerns addressed on today's visit. Toenails 1-5 b/l debrided in length and girth without incident. Continue foot and shoe inspections daily. Monitor blood glucose per PCP/Endocrinologist's recommendations. Continue soft, supportive shoe gear daily. Report any pedal injuries to medical professional. Call office if there are any questions/concerns. -Patient/POA to call should there be question/concern in the interim.   Return in about 3 months (around 02/10/2024).  Daniel Warner, DPM      [facility] LOCATION: [address], [place] [address]                   Office [phone]   [facility] LOCATION: [address] [place], [place] [address] Office [phone]

## 2023-11-22 ENCOUNTER — Ambulatory Visit: Admitting: Emergency Medicine

## 2023-11-22 DIAGNOSIS — Z01810 Encounter for preprocedural cardiovascular examination: Secondary | ICD-10-CM | POA: Diagnosis not present

## 2023-11-22 DIAGNOSIS — I2510 Atherosclerotic heart disease of native coronary artery without angina pectoris: Secondary | ICD-10-CM

## 2023-11-22 DIAGNOSIS — I2583 Coronary atherosclerosis due to lipid rich plaque: Secondary | ICD-10-CM

## 2023-11-22 NOTE — Telephone Encounter (Signed)
   Name: Daniel Warner  DOB: Jul 09, 1940  MRN: [id number]  Primary Cardiologist: Arun K Thukkani, MD  Chart reviewed as part of pre-operative protocol coverage. Because of Daniel Warner's past medical history and time since last visit, today's recent phone call, and discussion with Dr. Wendel, he will require a follow-up in-office visit in order to better assess preoperative cardiovascular risk.  Pre-op covering staff: - Please place orders for Echo and PET stress test.  - Please schedule appointment following these procedures and call patient to inform them. If patient already had an upcoming appointment within acceptable timeframe, please add pre-op clearance to the appointment notes so provider is aware. - Please contact requesting surgeon's office via preferred method (i.e, phone, fax) to inform them of need for appointment prior to surgery.  Preoperative recommendations regarding   Miriam FORBES Shams, NP  11/22/2023, 4:00 PM

## 2023-11-22 NOTE — Progress Notes (Signed)
 Virtual Visit via Telephone Note   Because of Daniel Warner co-morbid illnesses, he is at least at moderate risk for complications without adequate follow up.  This format is felt to be most appropriate for this patient at this time.  Due to technical limitations with video connection web designer), today's appointment will be conducted as an audio only telehealth visit, and LEVESTER WALDRIDGE verbally agreed to proceed in this manner.   All issues noted in this document were discussed and addressed.  No physical exam could be performed with this format.  Evaluation Performed:  Preoperative cardiovascular risk assessment _____________   Date:  11/22/2023   Patient ID:  Daniel CERVENY, DOB 03-29-1940, MRN [id number] Patient Location:  Home Provider location:   Office  Primary Care Provider:  Charlott Dorn LABOR, MD Primary Cardiologist:  Arun K Thukkani, MD  Chief Complaint / Patient Profile   83 y.o. y/o male with a h/o HFrEF, CAD with LHC in 02/2022 with L LCx 99%, L LAD 90% with medication management recommended, CKD stage IV, carotid artery disease, PAD s/p R SFA stent in 2016 with Dr. Oris, diabetes mellitus, hypertension, hyperlipidemia who is pending Endoscopy w/Dil for Dysphagia on 12/07/2023 and presents today for telephonic preoperative cardiovascular risk assessment.  History of Present Illness    Daniel Warner is a 83 y.o. male who presents via audio/video conferencing for a telehealth visit today.  Pt was last seen in cardiology clinic on 08/13/2023 by Katlyn West, NP.  At that time Daniel Warner was stable.  The patient is now pending procedure as outlined above. Since his last visit, he reports no further SOB, he denies chest pain, palpitations, orthopnea, n, v,  dark/tarry/bloody stools, hematuria, dizziness, syncope, edema, weight gain. He reports losing 20 pounds following new vitamin B12 injections over the last two months.   Past Medical History    Past  Medical History:  Diagnosis Date   Carotid artery disease    CKD (chronic kidney disease), stage IV (HCC)    Diabetes mellitus type 2 with peripheral artery disease (HCC)    Discolored skin    Right leg, thinks it's from a bag that has rubbed against his leg for so long. Doppler 12/2010   GERD (gastroesophageal reflux disease)    Hypercholesterolemia    Hyperlipidemia    Numbness of toes    In the morning   Obesity    Peripheral arterial disease    Past Surgical History:  Procedure Laterality Date   PERIPHERAL VASCULAR CATHETERIZATION N/A 10/16/2014   Procedure: Abdominal Aortogram w/Lower Extremity;  Surgeon: Gaile LELON New, MD;  Location: [facility] INVASIVE [facility];  Service: Cardiovascular;  Laterality: N/A;   RIGHT/LEFT HEART CATH AND CORONARY ANGIOGRAPHY N/A 02/16/2022   Procedure: RIGHT/LEFT HEART CATH AND CORONARY ANGIOGRAPHY;  Surgeon: Court Dorn PARAS, MD;  Location: [facility] INVASIVE CV LAB;  Service: Cardiovascular;  Laterality: N/A;   TONSILLECTOMY     WISDOM TOOTH EXTRACTION      Allergies  Allergies  Allergen Reactions   Losartan Potassium     Other reaction(s): itching and hand swelling  Other Reaction(s): itching and hand swelling   Lisinopril     Other reaction(s): Cough    Home Medications    Prior to Admission medications   Medication Sig Start Date End Date Taking? Authorizing Provider  aspirin  EC 81 MG tablet Take 1 tablet (81 mg total) by mouth daily. Swallow whole. 02/22/22   Arrien, Elidia Sieving, MD  atorvastatin  (  LIPITOR ) 80 MG tablet Take 80 mg by mouth every morning. 11/11/20   [provider]  calcitRIOL (ROCALTROL) 0.25 MCG capsule Take 0.25 mcg by mouth daily. 03/02/17   [provider]  carvedilol  (COREG ) 3.125 MG tablet Take 1 tablet (3.125 mg total) by mouth 2 (two) times daily with a meal. 02/22/22 11/04/23  Arrien, Elidia Sieving, MD  clopidogrel  (PLAVIX ) 75 MG tablet Take 1 tablet (75 mg total) by mouth daily with breakfast. 10/18/14    Gerome Maurilio HERO, PA-C  Continuous Blood Gluc Receiver (FREESTYLE LIBRE 14 DAY READER) DEVI  03/29/18   [provider]  Continuous Blood Gluc Sensor (FREESTYLE LIBRE 14 DAY SENSOR) MISC  06/27/18   [provider]  ergocalciferol (VITAMIN D2) 1.25 MG (50000 UT) capsule Take 50,000 Units by mouth once a week. Patient not taking: Reported on 11/10/2023 04/30/22   [provider]  hydrALAZINE  (APRESOLINE ) 10 MG tablet Take 1 tablet (10 mg total) by mouth in the morning and at bedtime. 02/22/22 11/04/23  Arrien, Mauricio Daniel, MD  isosorbide  mononitrate (IMDUR ) 30 MG 24 hr tablet Take 0.5 tablets (15 mg total) by mouth daily. 02/22/22 11/04/23  Arrien, Mauricio Daniel, MD  nitroGLYCERIN  (NITROSTAT ) 0.4 MG SL tablet Place 1 tablet (0.4 mg total) under the tongue every 5 (five) minutes as needed for chest pain. IF NO RELIEF CALL 911. 05/17/23   Thukkani, Arun K, MD  torsemide  (DEMADEX ) 20 MG tablet Take 20 mg by mouth 2 (two) times daily.    [provider]  TRULICITY  1.5 MG/0.5ML SOPN Inject 1.5 mg into the skin once a week. Saturday or Sunday Patient not taking: Reported on 11/10/2023 11/28/18   [provider]    Physical Exam    Vital Signs:  BRANSTON HALSTED does not have vital signs available for review today.  Given telephonic nature of communication, physical exam is limited. AAOx3. NAD. Normal affect.  Speech and respirations are unlabored.  Accessory Clinical Findings    None  Assessment & Plan    1.  Preoperative Cardiovascular Risk Assessment: According to the Revised Cardiac Risk Index (RCRI), his Perioperative Risk of Major Cardiac Event is (%): 11  His Functional Capacity in METs is: 3.97 according to the Duke Activity Status Index (DASI). Given patient's history of extensive CAD, decreased functional capacity, vague symptomatology at recent office visit, and his age, I have reached out to his attending cardiologist, Dr. Wendel, for  further guidance on preoperative cardiovascular recommendations.  Dr. Wendel would prefer to get an echo and PET stress test prior to upcoming procedure.  Only if high risk findings, would any further evaluation be required, especially considering CKD IV.   Will place orders for imaging and get patient a follow-up appointment scheduled in office after testing to discuss these results.  It was discussed with the patient that further testing may need to be done following discussion with MD, patient states his swallowing had improved and he was agreeable to further testing.  A copy of this note will be routed to requesting surgeon.  Time:   Today, I have spent 10 minutes with the patient with telehealth technology discussing medical history, symptoms, and management plan.     Nelta Caudill E Shaaron Golliday, NP  11/22/2023, 10:53 AM

## 2023-11-23 ENCOUNTER — Encounter (HOSPITAL_BASED_OUTPATIENT_CLINIC_OR_DEPARTMENT_OTHER): Payer: Self-pay

## 2023-11-23 NOTE — Telephone Encounter (Signed)
 I will update the requesting office the pt is going to need appt and testing.

## 2023-11-23 NOTE — Telephone Encounter (Signed)
 Daniel Warner. Tried to call the pt today in regard to cardiac testing needing to be scheduled. The pt's vm is full, cannot leave message to call back.   I will send a message to the surgeon office to let the pt know he needs to call our office back at # [phone].

## 2023-11-23 NOTE — Addendum Note (Signed)
 Addended by: Ewin Rehberg E on: 11/23/2023 12:06 PM   Modules accepted: Orders

## 2023-11-23 NOTE — Addendum Note (Signed)
 Addended by: GORDON RONAL SQUIBB on: 11/23/2023 02:07 PM   Modules accepted: Orders

## 2023-11-23 NOTE — Progress Notes (Signed)
 Notes sent to Dr. Wendel nurse Keven, R. RN who will arrange and order the cardiac test needed, as well as f/u appt for preop clearance.

## 2023-11-23 NOTE — Telephone Encounter (Signed)
 I will reach out to Dr. Parry nurse pt will need echo and Pet stress test, as well as an appt in office for follow up on testing and preop clearance.   I s/w Payton, R. RN who states she will order and schedule testing needing for preop clearance. Pt will need a follow up after testing to review results and for preop clearance.   I will send notes to Gabriella SAUNDERS. RN

## 2023-11-24 NOTE — Telephone Encounter (Signed)
 Echo has been scheduled for 12/13/23.

## 2023-11-24 NOTE — Telephone Encounter (Signed)
 S/w A. Shepherd, admin asst/scheduling team who states she tried to call the pt to schedule his echo for preop and Dr. Wendel. Vm is full      Will reach out to  Katelyn Causey as well to scheduling the PET STRESS test.  Alan states she sent MY CHART message to the pt with her name and # for him to call back to schedule the echo.

## 2023-11-26 ENCOUNTER — Encounter (HOSPITAL_COMMUNITY): Payer: Self-pay

## 2023-11-29 ENCOUNTER — Telehealth (HOSPITAL_COMMUNITY): Payer: Self-pay | Admitting: *Deleted

## 2023-11-29 NOTE — Telephone Encounter (Signed)
 Reaching out to patient to offer assistance regarding upcoming cardiac imaging study; pt verbalizes understanding of appt date/time, parking situation and where to check in, pre-test NPO status; name and call back number provided for further questions should they arise  Chantal Requena RN Navigator Cardiac Imaging [facility] [phone] office [phone] cell  Patient aware to avoid caffeine and Imdur  prior to his cardiac PET study.

## 2023-11-30 ENCOUNTER — Other Ambulatory Visit (HOSPITAL_COMMUNITY): Payer: Self-pay | Admitting: Internal Medicine

## 2023-11-30 ENCOUNTER — Ambulatory Visit (HOSPITAL_COMMUNITY)
Admission: RE | Admit: 2023-11-30 | Discharge: 2023-11-30 | Disposition: A | Discharge status: Home / Self Care | Source: Ambulatory Visit | Attending: Internal Medicine | Admitting: Internal Medicine

## 2023-11-30 DIAGNOSIS — I5020 Unspecified systolic (congestive) heart failure: Secondary | ICD-10-CM | POA: Diagnosis present

## 2023-11-30 DIAGNOSIS — I2510 Atherosclerotic heart disease of native coronary artery without angina pectoris: Secondary | ICD-10-CM | POA: Insufficient documentation

## 2023-11-30 DIAGNOSIS — R9431 Abnormal electrocardiogram [ECG] [EKG]: Secondary | ICD-10-CM | POA: Diagnosis present

## 2023-11-30 DIAGNOSIS — I2583 Coronary atherosclerosis due to lipid rich plaque: Secondary | ICD-10-CM | POA: Diagnosis present

## 2023-11-30 LAB — NM PET CT CARDIAC PERFUSION MULTI W/ABSOLUTE BLOODFLOW
LV dias vol: 227 mL (ref 62–150)
MBFR: 0.91
Nuc Rest EF: 23 %
Nuc Stress EF: 20 %
Peak HR: 81 {beats}/min
Rest HR: 76 {beats}/min
Rest MBF: 0.66 ml/g/min
ST Depression (mm): 0 mm
Stress MBF: 0.6 ml/g/min
TID: 1.17

## 2023-11-30 MED ORDER — REGADENOSON 0.4 MG/5ML IV SOLN
0.4000 mg | Freq: Once | INTRAVENOUS | Status: AC
  Administered 2023-11-30: 0.4 mg via INTRAVENOUS

## 2023-11-30 MED ORDER — RUBIDIUM RB82 GENERATOR (RUBYFILL)
20.4000 | PACK | Freq: Once | INTRAVENOUS | Status: AC
  Administered 2023-11-30: 19.94 via INTRAVENOUS

## 2023-11-30 MED ORDER — RUBIDIUM RB82 GENERATOR (RUBYFILL)
20.4000 | PACK | Freq: Once | INTRAVENOUS | Status: AC
  Administered 2023-11-30: 19.97 via INTRAVENOUS

## 2023-11-30 MED ORDER — REGADENOSON 0.4 MG/5ML IV SOLN
INTRAVENOUS | Status: AC
  Filled 2023-11-30: qty 5

## 2023-11-30 NOTE — Progress Notes (Signed)
 Pt tolerated lexiscan. Endorsed feeling chest tightness which subsided by end of scan. Given PO caffeine. Taken out by wheelchair, per baseline.

## 2023-12-01 ENCOUNTER — Ambulatory Visit: Payer: Self-pay | Admitting: Internal Medicine

## 2023-12-07 ENCOUNTER — Ambulatory Visit (HOSPITAL_COMMUNITY): Admission: RE | Admit: 2023-12-07 | Admitting: Gastroenterology

## 2023-12-07 ENCOUNTER — Encounter (HOSPITAL_COMMUNITY): Admission: RE | Payer: Self-pay | Source: Home / Self Care

## 2023-12-07 SURGERY — EGD (ESOPHAGOGASTRODUODENOSCOPY)
Anesthesia: Monitor Anesthesia Care

## 2023-12-07 NOTE — Telephone Encounter (Signed)
 Pt will no longer be having colonoscopy. Echo scheduled 12/1 cancelled.

## 2023-12-13 ENCOUNTER — Ambulatory Visit (HOSPITAL_COMMUNITY)

## 2023-12-23 ENCOUNTER — Ambulatory Visit: Admitting: Internal Medicine

## 2023-12-28 ENCOUNTER — Telehealth: Payer: Self-pay | Admitting: Internal Medicine

## 2023-12-28 NOTE — Telephone Encounter (Signed)
 Pt sister called back and stated she had come with the pt and his wife to our office this morning because they thought the appt with Dr. Wendel was today instead of 03/09/24. Scheduled pt for this Thursday 12/30/23 at 3 PM. Pt's sister thanked us  for calling back and getting pt in, no further questions or concerns at this time.

## 2023-12-28 NOTE — Telephone Encounter (Signed)
 Patient states he needs to see Thukkani ASAP as recommended by his Primary Care - Dr. Charlott.  Patient has appointment for 02/28/2024 - Please reach out to patient to see if he can get in to see Thukkani sooner.  Thank you.

## 2023-12-28 NOTE — Telephone Encounter (Signed)
 Per Dr. Charlott note: Patient with reported fatigue. Almost certainly due to his worsening heart failure. I have requested he follow-up with his cardiologist in the next couple of weeks. We will consider uptitrating torsemide  pending lab results.  Will send to Dr. Wendel and his nurse.

## 2023-12-28 NOTE — Telephone Encounter (Signed)
 Attempted to call pt to place on Dr. Wendel DOD schedule. LMTCB.

## 2023-12-29 ENCOUNTER — Ambulatory Visit (HOSPITAL_COMMUNITY)
Admission: RE | Admit: 2023-12-29 | Discharge: 2023-12-29 | Disposition: A | Discharge status: Home / Self Care | Source: Ambulatory Visit | Attending: Internal Medicine | Admitting: Internal Medicine

## 2023-12-29 ENCOUNTER — Ambulatory Visit: Payer: Self-pay | Admitting: Physician Assistant

## 2023-12-29 DIAGNOSIS — I2510 Atherosclerotic heart disease of native coronary artery without angina pectoris: Secondary | ICD-10-CM | POA: Insufficient documentation

## 2023-12-29 DIAGNOSIS — I5020 Unspecified systolic (congestive) heart failure: Secondary | ICD-10-CM | POA: Diagnosis present

## 2023-12-29 DIAGNOSIS — I2583 Coronary atherosclerosis due to lipid rich plaque: Secondary | ICD-10-CM | POA: Diagnosis present

## 2023-12-29 LAB — ECHOCARDIOGRAM COMPLETE
Area-P 1/2: 7.51 cm2
Calc EF: 28 %
S' Lateral: 4.1 cm
Single Plane A2C EF: 27.1 %
Single Plane A4C EF: 31.6 %

## 2023-12-29 MED ADMIN — Perflutren Lipid Microsphere IV Susp 1.1 MG/ML: 1 mL | INTRAVENOUS | @ 11:00:00 | NDC 99999100031

## 2023-12-29 NOTE — Progress Notes (Addendum)
 "  Cardiology Office Note:   Date:  12/30/2023  ID:  Daniel Warner, DOB 1940-10-18, MRN [id number] PCP:  Charlott Dorn LABOR, MD  [facility] Providers Cardiologist:  Wendel Haws, MD Referring MD: Charlott Dorn LABOR, MD  Chief Complaint/Reason for Referral: Follow-up heart failure ASSESSMENT:    1. HFrEF (heart failure with reduced ejection fraction) (HCC)   2. Coronary artery disease due to lipid rich plaque   3. Type 2 diabetes mellitus with complication, without long-term current use of insulin  (HCC)   4. Hypertension associated with diabetes (HCC)   5. Hyperlipidemia associated with type 2 diabetes mellitus (HCC)   6. CKD stage 4 due to type 2 diabetes mellitus (HCC)   7. PAD (peripheral artery disease)   8. BMI 30.0-30.9,adult     PLAN:   In order of problems listed above: Heart failure with reduced ejection fraction.  Echocardiogram demonstrated diminished function in evidence of elevated filling pressures. Continue Coreg  3.125 twice daily, hydralazine  10 mg twice daily, Imdur  15 mg daily, torsemide  20 mg twice daily, start Jardiance  10 mg daily.  Patient is currently pulsed dosing torsemide  at 40 mg twice a day and is on day 2 of 3 days as prescribed by her PCP.  I agree with this maneuver.  I believe most of the patient's issues are due to dietary indiscretion.  Volume status reasonable today. Coronary artery disease: Continue aspirin  81 mg, atorvastatin  80 mg, as needed nitroglycerin .  Discontinue Plavix  T2DM: Continue aspirin  81 mg, atorvastatin  80 mg, start Jardiance  10 mg.  Defer ARB due to intolerance Hypertension: Continue Coreg  3.125 mg twice daily, hydralazine  10 mg twice daily, Imdur  50 mg daily.  Blood pressure is well-controlled today Hyperlipidemia: Continue atorvastatin  80 mg.  Check lipid panel and LFTs today CKD stage IV: Start Jardiance  10 mg daily.  Defer ARB due to intolerance Peripheral arterial disease: Continue aspirin  81 mg, atorvastatin  80  mg. Elevated BMI: Will defer GLP-1 receptor agonist therapy given advanced age.            Dispo:  No follow-ups on file.       I spent 38 minutes reviewing all clinical data during and prior to this visit including all relevant imaging studies, laboratories, clinical information from other health systems and prior notes from both Cardiology and other specialties, interviewing the patient, conducting a complete physical examination, and coordinating care in order to formulate a comprehensive and personalized evaluation and treatment plan.   History of Present Illness:    FOCUSED PROBLEM LIST:   (HFrEF) heart failure with reduced ejection fraction  TTE 02/12/22: EF 25-30  TTE 06/23/22: EF 40-45, no RWMA, Gr 1 DD, NL RVSF, mild LAE, mild AV Ca2+, RAP 3  TTE 12/29/2023: EF 35 to 40%, no RWMA, G1 DD, LAE, mild MR, elevated LVEDP Coronary artery disease  LHC 02/16/22: oLCx 99, oLAD 90 - Med Rx recommended  Carotid artery disease US  01/24/16: Bilat 1-39 Peripheral arterial disease  S/p R SFA stent in 2016 (Dr. Oris) Diabetes mellitus  Not on insulin  Intolerant of ACE inhibitors and ARB's Hypertension  RA US  03/08/17: limited R RA; L RA 1-59  Hyperlipidemia   LP(a) 63.6 Aortic atherosclerosis Chest x-ray 2024 Chronic kidney disease stage IV BMI 11 January 2024: The patient is referred for expedited follow-up due to fatigue.  The patient was last seen in November due to preoperative indication for endoscopy and dilatation for dysphagia.  Due to his low functional capacity he was referred for  a stress test.  He has known high-grade disease of the left circumflex and LAD and his stress test was found to be high risk.  His procedure was eventually canceled.  He was seen by his PCP and reported fatigue.  BNP was elevated at 2833.  Echocardiogram was performed which demonstrated ejection fraction of 35 to 40% with elevated EDP E prime consistent with elevated LVEDP.  In terms of his  dysphagia the patient is decided not to undergo endoscopy.  He tells me that he gets fatigued after coming home.  He can do everything that he wants to do outside of his home but when he comes home he becomes relatively fatigued.  He was seen by his PCP recently and due to an elevated BNP his torsemide  was pulsed to 40 mg twice daily for 3 days.  He is on day 2 of this regimen.  He feels somewhat better.  He denies any significant shortness of breath at rest, paroxysmal atrial dyspnea, orthopnea, or chest pain.  He is here with his family and they tell me that he eats a lot of foods that he should not including sugary drinks and salt laden foods particularly at restaurants.     Current Medications: Active Medications[1]   Review of Systems:   Please see the history of present illness.    All other systems reviewed and are negative.     EKGs/Labs/Other Test Reviewed:   EKG: August 2025 sinus rhythm with first-degree AV block     CARDIAC STUDIES: Refer to CV Procedures and Imaging Tabs   Risk Assessment/Calculations:          Physical Exam:   VS:  BP 134/76   Pulse 66   Ht 5' 4 (1.626 m)   Wt 165 lb 12.8 oz (75.2 kg)   SpO2 92%   BMI 28.46 kg/m        Wt Readings from Last 3 Encounters:  12/30/23 165 lb 12.8 oz (75.2 kg)  08/13/23 183 lb (83 kg)  04/14/23 181 lb (82.1 kg)      GENERAL:  No apparent distress, AOx3 HEENT:  No carotid bruits, +2 carotid impulses, no scleral icterus CAR: RRR no murmurs, gallops, rubs, or thrills RES:  Clear to auscultation bilaterally ABD:  Soft, nontender, nondistended, positive bowel sounds x 4 VASC:  +2 radial pulses, +2 carotid pulses NEURO:  CN 2-12 grossly intact; motor and sensory grossly intact PSYCH:  No active depression or anxiety EXT:  No edema, ecchymosis, or cyanosis  Signed, Brandii Lakey K Ervin Rothbauer, MD  12/30/2023 3:36 PM    [facility] [address] [place], [place], [place]  [address] Phone: [phone]; Fax:  [phone]   Note:  This document was prepared using Dragon voice recognition software and may include unintentional dictation errors.     [1]  Current Meds  Medication Sig   aspirin  EC 81 MG tablet Take 1 tablet (81 mg total) by mouth daily. Swallow whole.   atorvastatin  (LIPITOR ) 80 MG tablet Take 80 mg by mouth every morning.   calcitRIOL (ROCALTROL) 0.25 MCG capsule Take 0.25 mcg by mouth daily.   carvedilol  (COREG ) 3.125 MG tablet Take 1 tablet (3.125 mg total) by mouth 2 (two) times daily with a meal.   Continuous Blood Gluc Receiver (FREESTYLE LIBRE 14 DAY READER) DEVI    Continuous Blood Gluc Sensor (FREESTYLE LIBRE 14 DAY SENSOR) MISC    empagliflozin  (JARDIANCE ) 10 MG TABS tablet Take 1 tablet (10 mg  total) by mouth daily before breakfast.   ergocalciferol (VITAMIN D2) 1.25 MG (50000 UT) capsule Take 50,000 Units by mouth once a week.   hydrALAZINE  (APRESOLINE ) 10 MG tablet Take 1 tablet (10 mg total) by mouth in the morning and at bedtime.   isosorbide  mononitrate (IMDUR ) 30 MG 24 hr tablet Take 0.5 tablets (15 mg total) by mouth daily.   nitroGLYCERIN  (NITROSTAT ) 0.4 MG SL tablet Place 1 tablet (0.4 mg total) under the tongue every 5 (five) minutes as needed for chest pain. IF NO RELIEF CALL 911.   torsemide  (DEMADEX ) 20 MG tablet Take 20 mg by mouth 2 (two) times daily.   [DISCONTINUED] clopidogrel  (PLAVIX ) 75 MG tablet Take 1 tablet (75 mg total) by mouth daily with breakfast.   "

## 2023-12-30 ENCOUNTER — Encounter: Payer: Self-pay | Admitting: Internal Medicine

## 2023-12-30 ENCOUNTER — Ambulatory Visit: Admitting: Internal Medicine

## 2023-12-30 VITALS — BP 134/76 | HR 66 | Ht 64.0 in | Wt 165.8 lb

## 2023-12-30 DIAGNOSIS — I152 Hypertension secondary to endocrine disorders: Secondary | ICD-10-CM | POA: Diagnosis not present

## 2023-12-30 DIAGNOSIS — E1159 Type 2 diabetes mellitus with other circulatory complications: Secondary | ICD-10-CM

## 2023-12-30 DIAGNOSIS — E118 Type 2 diabetes mellitus with unspecified complications: Secondary | ICD-10-CM | POA: Diagnosis not present

## 2023-12-30 DIAGNOSIS — I739 Peripheral vascular disease, unspecified: Secondary | ICD-10-CM

## 2023-12-30 DIAGNOSIS — Z6830 Body mass index (BMI) 30.0-30.9, adult: Secondary | ICD-10-CM

## 2023-12-30 DIAGNOSIS — E785 Hyperlipidemia, unspecified: Secondary | ICD-10-CM

## 2023-12-30 DIAGNOSIS — N184 Chronic kidney disease, stage 4 (severe): Secondary | ICD-10-CM

## 2023-12-30 DIAGNOSIS — I2510 Atherosclerotic heart disease of native coronary artery without angina pectoris: Secondary | ICD-10-CM | POA: Diagnosis not present

## 2023-12-30 DIAGNOSIS — E1122 Type 2 diabetes mellitus with diabetic chronic kidney disease: Secondary | ICD-10-CM | POA: Diagnosis not present

## 2023-12-30 DIAGNOSIS — E1169 Type 2 diabetes mellitus with other specified complication: Secondary | ICD-10-CM

## 2023-12-30 DIAGNOSIS — I5020 Unspecified systolic (congestive) heart failure: Secondary | ICD-10-CM | POA: Diagnosis not present

## 2023-12-30 DIAGNOSIS — I2583 Coronary atherosclerosis due to lipid rich plaque: Secondary | ICD-10-CM

## 2023-12-30 MED ORDER — EMPAGLIFLOZIN 10 MG PO TABS: 10.0000 mg | ORAL_TABLET | Freq: Every day | ORAL | 3 refills | Status: DC

## 2023-12-30 NOTE — Patient Instructions (Addendum)
 Medication Instructions:  STOP Clopidogrel  (Plavix )  START Jardiance  10 mg once daily  *If you need a refill on your cardiac medications before your next appointment, please call your pharmacy*  Lab Work: To be completed today: lipid panel and LFT  If you have labs (blood work) drawn today and your tests are completely normal, you will receive your results only by: MyChart Message (if you have MyChart) OR A paper copy in the mail If you have any lab test that is abnormal or we need to change your treatment, we will call you to review the results.  Testing/Procedures: None ordered today.  Follow-Up: At [facility], you and your health needs are our priority.  As part of our continuing mission to provide you with exceptional heart care, our providers are all part of one team.  This team includes your primary Cardiologist (physician) and Advanced Practice Providers or APPs (Physician Assistants and Nurse Practitioners) who all work together to provide you with the care you need, when you need it.  Your next appointment:   6 month(s)  Provider:   Glendia Ferrier, PA-C   DASH Diet: Care Instructions Your Care Instructions  The DASH diet is an eating plan that can help lower your blood pressure. DASH stands for Dietary Approaches to Stop Hypertension. Hypertension is high blood pressure. The DASH diet focuses on eating foods that are high in calcium , potassium, and magnesium. These nutrients can lower blood pressure. The foods that are highest in these nutrients are fruits, vegetables, low-fat dairy products, nuts, seeds, and legumes. But taking calcium , potassium, and magnesium supplements instead of eating foods that are high in those nutrients does not have the same effect. The DASH diet also includes whole grains, fish, and poultry. The DASH diet is one of several lifestyle changes your doctor may recommend to lower your high blood pressure. Your doctor may also want you to  decrease the amount of sodium in your diet. Lowering sodium while following the DASH diet can lower blood pressure even further than just the DASH diet alone. Follow-up care is a key part of your treatment and safety. Be sure to make and go to all appointments, and call your doctor if you are having problems. It's also a good idea to know your test results and keep a list of the medicines you take. How can you care for yourself at home? Following the DASH diet  Eat 4 to 5 servings of fruit each day. A serving is 1 medium-sized piece of fruit,  cup chopped or canned fruit, 1/4 cup dried fruit, or 4 ounces ( cup) of fruit juice. Choose fruit more often than fruit juice.  Eat 4 to 5 servings of vegetables each day. A serving is 1 cup of lettuce or raw leafy vegetables,  cup of chopped or cooked vegetables, or 4 ounces ( cup) of vegetable juice. Choose vegetables more often than vegetable juice.  Get 2 to 3 servings of low-fat and fat-free dairy each day. A serving is 8 ounces of milk, 1 cup of yogurt, or 1  ounces of cheese.  Eat 6 to 8 servings of grains each day. A serving is 1 slice of bread, 1 ounce of dry cereal, or  cup of cooked rice, pasta, or cooked cereal. Try to choose whole-grain products as much as possible.  Limit lean meat, poultry, and fish to 2 servings each day. A serving is 3 ounces, about the size of a deck of cards.  Eat 4  to 5 servings of nuts, seeds, and legumes (cooked dried beans, lentils, and split peas) each week. A serving is 1/3 cup of nuts, 2 tablespoons of seeds, or  cup of cooked beans or peas.  Limit fats and oils to 2 to 3 servings each day. A serving is 1 teaspoon of vegetable oil or 2 tablespoons of salad dressing.  Limit sweets and added sugars to 5 servings or less a week. A serving is 1 tablespoon jelly or jam,  cup sorbet, or 1 cup of lemonade.  Eat less than 2,300 milligrams (mg) of sodium a day. If you limit your sodium to 1,500 mg a day, you can  lower your blood pressure even more. Tips for success  Start small. Do not try to make dramatic changes to your diet all at once. You might feel that you are missing out on your favorite foods and then be more likely to not follow the plan. Make small changes, and stick with them. Once those changes become habit, add a few more changes.  Try some of the following: ? Make it a goal to eat a fruit or vegetable at every meal and at snacks. This will make it easy to get the recommended amount of fruits and vegetables each day. ? Try yogurt topped with fruit and nuts for a snack or healthy dessert. ? Add lettuce, tomato, cucumber, and onion to sandwiches. ? Combine a ready-made pizza crust with low-fat mozzarella cheese and lots of vegetable toppings. Try using tomatoes, squash, spinach, broccoli, carrots, cauliflower, and onions. ? Have a variety of cut-up vegetables with a low-fat dip as an appetizer instead of chips and dip. ? Sprinkle sunflower seeds or chopped almonds over salads. Or try adding chopped walnuts or almonds to cooked vegetables. ? Try some vegetarian meals using beans and peas. Add garbanzo or kidney beans to salads. Make burritos and tacos with mashed pinto beans or black beans.

## 2023-12-31 ENCOUNTER — Telehealth: Payer: Self-pay

## 2023-12-31 ENCOUNTER — Ambulatory Visit: Payer: Self-pay | Admitting: Internal Medicine

## 2023-12-31 ENCOUNTER — Other Ambulatory Visit: Payer: Self-pay

## 2023-12-31 LAB — HEPATIC FUNCTION PANEL
ALT: 10 IU/L (ref 0–44)
AST: 16 IU/L (ref 0–40)
Albumin: 3.7 g/dL (ref 3.7–4.7)
Alkaline Phosphatase: 115 IU/L (ref 48–129)
Bilirubin Total: 0.6 mg/dL (ref 0.0–1.2)
Bilirubin, Direct: 0.29 mg/dL (ref 0.00–0.40)
Total Protein: 6.2 g/dL (ref 6.0–8.5)

## 2023-12-31 LAB — LIPID PANEL
Chol/HDL Ratio: 2.5 ratio (ref 0.0–5.0)
Cholesterol, Total: 119 mg/dL (ref 100–199)
HDL: 47 mg/dL
LDL Chol Calc (NIH): 58 mg/dL (ref 0–99)
Triglycerides: 66 mg/dL (ref 0–149)
VLDL Cholesterol Cal: 14 mg/dL (ref 5–40)

## 2023-12-31 MED ORDER — EMPAGLIFLOZIN 10 MG PO TABS: 10.0000 mg | ORAL_TABLET | Freq: Every day | ORAL | 3 refills | Status: AC

## 2023-12-31 NOTE — Telephone Encounter (Signed)
 Spoke with pt's wife to let her know that I called [facility] and they stated they do not have a rx for Plavix . Wife stated that they realized today he did not get his Plavix  from [facility] but from [facility] instead and they will be stopping it. New prescription sent to [facility] for Jardiance  so that they will start adding that medication into the pt's pill packets.

## 2024-02-17 NOTE — Progress Notes (Unsigned)
 "  Cardiology Office Note:   Date:  02/17/2024  ID:  Daniel Warner, DOB [birth year], MRN [id number] PCP:  Charlott Dorn LABOR, MD  [facility] Providers Cardiologist:  Wendel Haws, MD Referring MD: Charlott Dorn LABOR, MD  Chief Complaint/Reason for Referral:  *** ASSESSMENT:    1. HFrEF (heart failure with reduced ejection fraction) (HCC)   2. Coronary artery disease due to lipid rich plaque   3. Type 2 diabetes mellitus with complication, without long-term current use of insulin  (HCC)   4. Hypertension associated with diabetes (HCC)   5. Hyperlipidemia associated with type 2 diabetes mellitus (HCC)   6. CKD stage 4 due to type 2 diabetes mellitus (HCC)   7. PAD (peripheral artery disease)   8. BMI 30.0-30.9,adult     PLAN:   In order of problems listed above: Heart failure with reduced ejection fraction.  Echocardiogram demonstrated diminished function in evidence of elevated filling pressures. Continue Coreg  3.125 twice daily, risk hydralazine  10 mg twice daily, Imdur  15 mg daily, torsemide  20 mg twice daily, Jardiance  10 mg daily.  *** Coronary artery disease: Continue aspirin  81 mg, atorvastatin  80 mg, as needed nitroglycerin .   T2DM: Continue aspirin  81 mg, atorvastatin  80 mg, Jardiance  10 mg.  Defer ARB due to intolerance Hypertension: Continue Coreg  3.125 mg twice daily, hydralazine  10 mg twice daily, Imdur  50 mg daily.  *** Hyperlipidemia: Continue atorvastatin  80 mg.  LDL was 58 in December CKD stage IV: Start Jardiance  10 mg daily.  Defer ARB due to intolerance Peripheral arterial disease: Continue aspirin  81 mg, atorvastatin  80 mg. Elevated BMI: Will defer GLP-1 receptor agonist therapy given advanced age.        {Are you ordering a CV Procedure (e.g. stress test, cath, DCCV, TEE, etc)?   Press F2        :[id number]}   Dispo:  No follow-ups on file.       I spent *** minutes reviewing all clinical data during and prior to this visit including all relevant  imaging studies, laboratories, clinical information from other health systems and prior notes from both Cardiology and other specialties, interviewing the patient, conducting a complete physical examination, and coordinating care in order to formulate a comprehensive and personalized evaluation and treatment plan.     History of Present Illness:    FOCUSED PROBLEM LIST:   (HFrEF) heart failure with reduced ejection fraction  TTE 02/12/22: EF 25-30  TTE 06/23/22: EF 40-45, no RWMA, Gr 1 DD, NL RVSF, mild LAE, mild AV Ca2+, RAP 3  TTE 12/29/2023: EF 35 to 40%, no RWMA, G1 DD, LAE, mild MR, elevated LVEDP Coronary artery disease  LHC 02/16/22: oLCx 99, oLAD 90 - Med Rx recommended  Carotid artery disease US  01/24/16: Bilat 1-39 Peripheral arterial disease  S/p R SFA stent in 2016 (Dr. Oris) Diabetes mellitus  Not on insulin  Intolerant of ACE inhibitors and ARB's Hypertension  RA US  03/08/17: limited R RA; L RA 1-59  Hyperlipidemia   LP(a) 63.6 Aortic atherosclerosis Chest x-ray 2024 Chronic kidney disease stage IV BMI 11 January 2024: The patient is referred for expedited follow-up due to fatigue.  The patient was last seen in November due to preoperative indication for endoscopy and dilatation for dysphagia.  Due to his low functional capacity he was referred for a stress test.  He has known high-grade disease of the left circumflex and LAD and his stress test was found to be high risk.  His procedure was eventually canceled.  He was seen by his PCP and reported fatigue.  BNP was elevated at 2833.  Echocardiogram was performed which demonstrated ejection fraction of 35 to 40% with elevated EDP E prime consistent with elevated LVEDP.  In terms of his dysphagia the patient is decided not to undergo endoscopy.  He tells me that he gets fatigued after coming home.  He can do everything that he wants to do outside of his home but when he comes home he becomes relatively fatigued.  He was seen  by his PCP recently and due to an elevated BNP his torsemide  was pulsed to 40 mg twice daily for 3 days.  He is on day 2 of this regimen.  He feels somewhat better.  He denies any significant shortness of breath at rest, paroxysmal atrial dyspnea, orthopnea, or chest pain.  He is here with his family and they tell me that he eats a lot of foods that he should not including sugary drinks and salt laden foods particularly at restaurants.  Plan: Start Jardiance  10 mg daily, check lipid panel and LFTs.  Discontinue Plavix   February 2026:  Patient consents to use of AI scribe. The patient returns for follow-up.  His LDL was 58.     Current Medications: Active Medications[1]   Review of Systems:   Please see the history of present illness.    All other systems reviewed and are negative.     EKGs/Labs/Other Test Reviewed:   EKG: December 2025 sinus rhythm likely lead reversal repeat EKG***  EKG Interpretation Date/Time:    Ventricular Rate:    PR Interval:    QRS Duration:    QT Interval:    QTC Calculation:   R Axis:      Text Interpretation:          CARDIAC STUDIES: Refer to CV Procedures and Imaging Tabs   Risk Assessment/Calculations:   {Does this patient have ATRIAL FIBRILLATION?:[phone]}      Physical Exam:   VS:  There were no vitals taken for this visit.   No BP recorded.  {Refresh Note OR Click here to enter BP  :1}***   Wt Readings from Last 3 Encounters:  12/30/23 165 lb 12.8 oz (75.2 kg)  08/13/23 183 lb (83 kg)  04/14/23 181 lb (82.1 kg)      GENERAL:  No apparent distress, AOx3 HEENT:  No carotid bruits, +2 carotid impulses, no scleral icterus CAR: RRR Irregular RR*** no murmurs***, gallops, rubs, or thrills RES:  Clear to auscultation bilaterally ABD:  Soft, nontender, nondistended, positive bowel sounds x 4 VASC:  +2 radial pulses, +2 carotid pulses NEURO:  CN 2-12 grossly intact; motor and sensory grossly intact PSYCH:  No active depression or  anxiety EXT:  No edema, ecchymosis, or cyanosis  Signed, Gia Lusher K Dontai Pember, MD  02/17/2024 7:07 AM    [facility] [address] [place], [place], [place]  [address] Phone: [phone]; Fax: [phone]   Note:  This document was prepared using Dragon voice recognition software and may include unintentional dictation errors.    [1]  No outpatient medications have been marked as taking for the 02/28/24 encounter (Appointment) with Shameka Aggarwal K, MD.   "

## 2024-02-22 ENCOUNTER — Ambulatory Visit: Admitting: Podiatry

## 2024-02-28 ENCOUNTER — Ambulatory Visit: Admitting: Internal Medicine

## 2024-02-28 DIAGNOSIS — I739 Peripheral vascular disease, unspecified: Secondary | ICD-10-CM

## 2024-02-28 DIAGNOSIS — E1159 Type 2 diabetes mellitus with other circulatory complications: Secondary | ICD-10-CM

## 2024-02-28 DIAGNOSIS — Z6830 Body mass index (BMI) 30.0-30.9, adult: Secondary | ICD-10-CM

## 2024-02-28 DIAGNOSIS — I5020 Unspecified systolic (congestive) heart failure: Secondary | ICD-10-CM

## 2024-02-28 DIAGNOSIS — E1169 Type 2 diabetes mellitus with other specified complication: Secondary | ICD-10-CM

## 2024-02-28 DIAGNOSIS — E1122 Type 2 diabetes mellitus with diabetic chronic kidney disease: Secondary | ICD-10-CM

## 2024-02-28 DIAGNOSIS — I2510 Atherosclerotic heart disease of native coronary artery without angina pectoris: Secondary | ICD-10-CM

## 2024-02-28 DIAGNOSIS — E118 Type 2 diabetes mellitus with unspecified complications: Secondary | ICD-10-CM
